# Patient Record
Sex: Male | Born: 1940 | Race: Black or African American | Hispanic: No | Marital: Married | Smoking: Former smoker
Health system: Southern US, Community
[De-identification: ages and names within clinical notes are randomized; demographics above are authoritative.]

## PROBLEM LIST (undated history)

## (undated) DIAGNOSIS — N390 Urinary tract infection, site not specified: Secondary | ICD-10-CM

## (undated) DIAGNOSIS — I10 Essential (primary) hypertension: Secondary | ICD-10-CM

## (undated) DIAGNOSIS — F0390 Unspecified dementia without behavioral disturbance: Secondary | ICD-10-CM

## (undated) DIAGNOSIS — G309 Alzheimer's disease, unspecified: Secondary | ICD-10-CM

## (undated) DIAGNOSIS — E119 Type 2 diabetes mellitus without complications: Secondary | ICD-10-CM

## (undated) DIAGNOSIS — F0280 Dementia in other diseases classified elsewhere without behavioral disturbance: Secondary | ICD-10-CM

## (undated) HISTORY — DX: Essential (primary) hypertension: I10

## (undated) HISTORY — DX: Type 2 diabetes mellitus without complications: E11.9

## (undated) HISTORY — PX: PROSTATE SURGERY: SHX751

## (undated) HISTORY — DX: Unspecified dementia, unspecified severity, without behavioral disturbance, psychotic disturbance, mood disturbance, and anxiety: F03.90

---

## 2005-05-04 ENCOUNTER — Emergency Department: Payer: Self-pay | Admitting: Emergency Medicine

## 2009-12-02 ENCOUNTER — Ambulatory Visit: Payer: Self-pay | Admitting: Urology

## 2009-12-08 ENCOUNTER — Inpatient Hospital Stay: Payer: Self-pay | Admitting: Urology

## 2014-04-30 ENCOUNTER — Ambulatory Visit: Payer: Self-pay | Admitting: Family Medicine

## 2015-04-20 ENCOUNTER — Other Ambulatory Visit: Payer: Self-pay | Admitting: Unknown Physician Specialty

## 2015-04-29 ENCOUNTER — Other Ambulatory Visit: Payer: Self-pay | Admitting: Unknown Physician Specialty

## 2015-04-29 MED ORDER — DONEPEZIL HCL 10 MG PO TABS: 10.0000 mg | ORAL_TABLET | Freq: Every day | ORAL | Status: DC

## 2015-04-29 MED ORDER — LISINOPRIL 20 MG PO TABS: 20.0000 mg | ORAL_TABLET | Freq: Every day | ORAL | Status: DC

## 2015-06-16 DIAGNOSIS — F0280 Dementia in other diseases classified elsewhere without behavioral disturbance: Secondary | ICD-10-CM

## 2015-06-16 DIAGNOSIS — G309 Alzheimer's disease, unspecified: Secondary | ICD-10-CM

## 2015-06-16 HISTORY — DX: Dementia in other diseases classified elsewhere, unspecified severity, without behavioral disturbance, psychotic disturbance, mood disturbance, and anxiety: F02.80

## 2015-06-16 HISTORY — DX: Alzheimer's disease, unspecified: G30.9

## 2015-07-25 ENCOUNTER — Ambulatory Visit (INDEPENDENT_AMBULATORY_CARE_PROVIDER_SITE_OTHER): Payer: Medicare Other | Admitting: Unknown Physician Specialty

## 2015-07-25 ENCOUNTER — Encounter: Payer: Self-pay | Admitting: Unknown Physician Specialty

## 2015-07-25 VITALS — BP 141/83 | HR 70 | Temp 97.9°F | Ht 69.3 in | Wt 160.8 lb

## 2015-07-25 DIAGNOSIS — I10 Essential (primary) hypertension: Secondary | ICD-10-CM | POA: Diagnosis not present

## 2015-07-25 DIAGNOSIS — F0390 Unspecified dementia without behavioral disturbance: Secondary | ICD-10-CM | POA: Diagnosis not present

## 2015-07-25 MED ORDER — MEMANTINE HCL 5 MG PO TABS: 5.0000 mg | ORAL_TABLET | Freq: Two times a day (BID) | ORAL | Status: DC

## 2015-07-25 NOTE — Patient Instructions (Signed)
Start Vitamin D 2,000 IUs/day

## 2015-07-25 NOTE — Progress Notes (Signed)
   BP 141/83 mmHg  Pulse 70  Temp(Src) 97.9 F (36.6 C)  Ht 5' 9.3" (1.76 m)  Wt 160 lb 12.8 oz (72.938 kg)  BMI 23.55 kg/m2  SpO2 98%   Subjective:    Patient ID: Dennis Sandoval, male    DOB: 05/12/1941, 74 y.o.   MRN: [id number]  HPI: Dennis Sandoval is a 74 y.o. male  Chief Complaint  Patient presents with  . Medication Refill    pt's daughter in law states that patient needs refills on medications   Hypertension This is a chronic problem. The problem is controlled. Pertinent negatives include no anxiety, blurred vision, chest pain, headaches, malaise/fatigue, neck pain, orthopnea, palpitations, peripheral edema, PND, shortness of breath or sweats. There are no compliance problems.    Dementia According to daughter, pt is starting to have a lot of irritability wiith memory loss.  He is still able to do all ADLs.  Family drives with him and feels he is a safe driver.  Living at home with his wife, also with demnetia  Relevant past medical, surgical, family and social history reviewed and updated as indicated. Interim medical history since our last visit reviewed. Allergies and medications reviewed and updated.  Review of Systems  Constitutional: Negative for malaise/fatigue.  Eyes: Negative for blurred vision.  Respiratory: Negative for shortness of breath.   Cardiovascular: Negative for chest pain, palpitations, orthopnea and PND.  Musculoskeletal: Negative for neck pain.  Neurological: Negative for headaches.    Per HPI unless specifically indicated above     Objective:    BP 141/83 mmHg  Pulse 70  Temp(Src) 97.9 F (36.6 C)  Ht 5' 9.3" (1.76 m)  Wt 160 lb 12.8 oz (72.938 kg)  BMI 23.55 kg/m2  SpO2 98%  Wt Readings from Last 3 Encounters:  07/25/15 160 lb 12.8 oz (72.938 kg)  07/10/13 178 lb (80.74 kg)    Physical Exam  Constitutional: He is oriented to person, place, and time. He appears well-developed and well-nourished. No distress.  HENT:  Head:  Normocephalic and atraumatic.  Eyes: Conjunctivae and lids are normal. Right eye exhibits no discharge. Left eye exhibits no discharge. No scleral icterus.  Cardiovascular: Normal rate, regular rhythm and normal heart sounds.   Pulmonary/Chest: Effort normal and breath sounds normal. No respiratory distress.  Abdominal: Normal appearance. There is no splenomegaly or hepatomegaly.  Musculoskeletal: Normal range of motion.  Neurological: He is alert and oriented to person, place, and time.  Skin: Skin is warm, dry and intact. No rash noted. No pallor.  Psychiatric: He has a normal mood and affect. His behavior is normal. Judgment and thought content normal.    No results found for this or any previous visit.    Assessment & Plan:   Problem List Items Addressed This Visit      Unprioritized   Dementia - Primary   Relevant Medications   memantine (NAMENDA) 5 MG tablet   Benign hypertension   Relevant Orders   Uric acid   Comprehensive metabolic panel   Lipid Panel w/o Chol/HDL Ratio   Microalbumin / creatinine urine ratio      Long discussion about trajectory of illness and safety issues.    Follow up plan: Return in about 3 months (around 10/24/2015) for physical.

## 2015-07-26 LAB — LIPID PANEL W/O CHOL/HDL RATIO
CHOLESTEROL TOTAL: 186 mg/dL (ref 100–199)
HDL: 61 mg/dL (ref >39)
LDL Calculated: 106 mg/dL — ABNORMAL HIGH (ref 0–99)
Triglycerides: 94 mg/dL (ref 0–149)
VLDL Cholesterol Cal: 19 mg/dL (ref 5–40)

## 2015-07-26 LAB — COMPREHENSIVE METABOLIC PANEL
ALK PHOS: 56 IU/L (ref 39–117)
ALT: 12 IU/L (ref 0–44)
AST: 11 IU/L (ref 0–40)
Albumin/Globulin Ratio: 1.5 (ref 1.1–2.5)
Albumin: 4.4 g/dL (ref 3.5–4.8)
BILIRUBIN TOTAL: 0.4 mg/dL (ref 0.0–1.2)
BUN/Creatinine Ratio: 13 (ref 10–22)
BUN: 13 mg/dL (ref 8–27)
CO2: 26 mmol/L (ref 18–29)
Calcium: 9.8 mg/dL (ref 8.6–10.2)
Chloride: 99 mmol/L (ref 97–108)
Creatinine, Ser: 0.99 mg/dL (ref 0.76–1.27)
GFR calc Af Amer: 86 mL/min/{1.73_m2} (ref >59)
GFR, EST NON AFRICAN AMERICAN: 74 mL/min/{1.73_m2} (ref >59)
GLOBULIN, TOTAL: 2.9 g/dL (ref 1.5–4.5)
Glucose: 137 mg/dL — ABNORMAL HIGH (ref 65–99)
POTASSIUM: 4.4 mmol/L (ref 3.5–5.2)
SODIUM: 138 mmol/L (ref 134–144)
Total Protein: 7.3 g/dL (ref 6.0–8.5)

## 2015-07-26 LAB — URIC ACID: Uric Acid: 3.6 mg/dL — ABNORMAL LOW (ref 3.7–8.6)

## 2015-07-26 LAB — MICROALBUMIN / CREATININE URINE RATIO
CREATININE, UR: 153.3 mg/dL
MICROALB/CREAT RATIO: 4.6 mg/g creat (ref 0.0–30.0)
Microalbumin, Urine: 7.1 ug/mL

## 2015-08-03 ENCOUNTER — Encounter: Payer: Self-pay | Admitting: Unknown Physician Specialty

## 2015-08-04 ENCOUNTER — Ambulatory Visit: Payer: Self-pay | Admitting: Unknown Physician Specialty

## 2015-10-06 ENCOUNTER — Encounter: Payer: Self-pay | Admitting: Unknown Physician Specialty

## 2015-10-29 ENCOUNTER — Telehealth: Payer: Self-pay | Admitting: Unknown Physician Specialty

## 2015-10-29 DIAGNOSIS — F0391 Unspecified dementia with behavioral disturbance: Secondary | ICD-10-CM

## 2015-10-30 NOTE — Telephone Encounter (Signed)
Routing to provider  

## 2015-10-30 NOTE — Telephone Encounter (Signed)
pts family(Dennis Sandoval(son) and Dennis Sandoval(daugher in law) would like to get a ref for Dennis Sand[facility] to [facility]. The phone number for the office is [phone] and fax is [phone].

## 2015-10-30 NOTE — Telephone Encounter (Signed)
I will put the order in, but they went to [facility] which [facility].  Some institutions don't do second opinions within the practice.  If they don't, [facility] is fine

## 2015-11-25 ENCOUNTER — Ambulatory Visit: Payer: Medicare Other | Admitting: Unknown Physician Specialty

## 2015-11-28 ENCOUNTER — Encounter: Payer: Self-pay | Admitting: Family Medicine

## 2015-11-28 ENCOUNTER — Ambulatory Visit (INDEPENDENT_AMBULATORY_CARE_PROVIDER_SITE_OTHER): Payer: Medicare Other | Admitting: Family Medicine

## 2015-11-28 VITALS — BP 147/77 | HR 74 | Temp 97.5°F | Ht 69.0 in | Wt 158.0 lb

## 2015-11-28 DIAGNOSIS — Z136 Encounter for screening for cardiovascular disorders: Secondary | ICD-10-CM

## 2015-11-28 DIAGNOSIS — Z720 Tobacco use: Secondary | ICD-10-CM | POA: Diagnosis not present

## 2015-11-28 DIAGNOSIS — I10 Essential (primary) hypertension: Secondary | ICD-10-CM

## 2015-11-28 DIAGNOSIS — Z87891 Personal history of nicotine dependence: Secondary | ICD-10-CM | POA: Insufficient documentation

## 2015-11-28 DIAGNOSIS — Z0000 Encounter for general adult medical examination without abnormal findings: Secondary | ICD-10-CM | POA: Diagnosis not present

## 2015-11-28 DIAGNOSIS — Z23 Encounter for immunization: Secondary | ICD-10-CM | POA: Diagnosis not present

## 2015-11-28 DIAGNOSIS — F0391 Unspecified dementia with behavioral disturbance: Secondary | ICD-10-CM

## 2015-11-28 NOTE — Patient Instructions (Addendum)
You received the pneumonia vaccine PCV-13 and you don't need another booster for the rest of your days Return for a fasting blood sugar next week Return the end of life paperwork for your records here in our chart Let's see how quickly we can get him to neurology  Health Maintenance, Male A healthy lifestyle and preventative care can promote health and wellness.  Maintain regular health, dental, and eye exams.  Eat a healthy diet. Foods like vegetables, fruits, whole grains, low-fat dairy products, and lean protein foods contain the nutrients you need and are low in calories. Decrease your intake of foods high in solid fats, added sugars, and salt. Get information about a proper diet from your health care provider, if necessary.  Regular physical exercise is one of the most important things you can do for your health. Most adults should get at least 150 minutes of moderate-intensity exercise (any activity that increases your heart rate and causes you to sweat) each week. In addition, most adults need muscle-strengthening exercises on 2 or more days a week.   Maintain a healthy weight. The body mass index (BMI) is a screening tool to identify possible weight problems. It provides an estimate of body fat based on height and weight. Your health care provider can find your BMI and can help you achieve or maintain a healthy weight. For males 20 years and older:  A BMI below 18.5 is considered underweight.  A BMI of 18.5 to 24.9 is normal.  A BMI of 25 to 29.9 is considered overweight.  A BMI of 30 and above is considered obese.  Maintain normal blood lipids and cholesterol by exercising and minimizing your intake of saturated fat. Eat a balanced diet with plenty of fruits and vegetables. Blood tests for lipids and cholesterol should begin at age 2 and be repeated every 5 years. If your lipid or cholesterol levels are high, you are over age 41, or you are at high risk for heart disease, you may  need your cholesterol levels checked more frequently.Ongoing high lipid and cholesterol levels should be treated with medicines if diet and exercise are not working.  If you smoke, find out from your health care provider how to quit. If you do not use tobacco, do not start.  Lung cancer screening is recommended for adults aged 55-80 years who are at high risk for developing lung cancer because of a history of smoking. A yearly low-dose CT scan of the lungs is recommended for people who have at least a 30-pack-year history of smoking and are current smokers or have quit within the past 15 years. A pack year of smoking is smoking an average of 1 pack of cigarettes a day for 1 year (for example, a 30-pack-year history of smoking could mean smoking 1 pack a day for 30 years or 2 packs a day for 15 years). Yearly screening should continue until the smoker has stopped smoking for at least 15 years. Yearly screening should be stopped for people who develop a health problem that would prevent them from having lung cancer treatment.  If you choose to drink alcohol, do not have more than 2 drinks per day. One drink is considered to be 12 oz (360 mL) of beer, 5 oz (150 mL) of wine, or 1.5 oz (45 mL) of liquor.  Avoid the use of street drugs. Do not share needles with anyone. Ask for help if you need support or instructions about stopping the use of drugs.  High  blood pressure causes heart disease and increases the risk of stroke. High blood pressure is more likely to develop in:  People who have blood pressure in the end of the normal range (100-139/85-89 mm Hg).  People who are overweight or obese.  People who are African American.  If you are 3518-76 years of age, have your blood pressure checked every 3-5 years. If you are 76 years of age or older, have your blood pressure checked every year. You should have your blood pressure measured twice--once when you are at a hospital or clinic, and once when you are  not at a hospital or clinic. Record the average of the two measurements. To check your blood pressure when you are not at a hospital or clinic, you can use:  An automated blood pressure machine at a pharmacy.  A home blood pressure monitor.  If you are 745-76 years old, ask your health care provider if you should take aspirin to prevent heart disease.  Diabetes screening involves taking a blood sample to check your fasting blood sugar level. This should be done once every 3 years after age 76 if you are at a normal weight and without risk factors for diabetes. Testing should be considered at a younger age or be carried out more frequently if you are overweight and have at least 1 risk factor for diabetes.  Colorectal cancer can be detected and often prevented. Most routine colorectal cancer screening begins at the age of 76 and continues through age 875. However, your health care provider may recommend screening at an earlier age if you have risk factors for colon cancer. On a yearly basis, your health care provider may provide home test kits to check for hidden blood in the stool. A small camera at the end of a tube may be used to directly examine the colon (sigmoidoscopy or colonoscopy) to detect the earliest forms of colorectal cancer. Talk to your health care provider about this at age 76 when routine screening begins. A direct exam of the colon should be repeated every 5-10 years through age 76, unless early forms of precancerous polyps or small growths are found.  People who are at an increased risk for hepatitis B should be screened for this virus. You are considered at high risk for hepatitis B if:  You were born in a country where hepatitis B occurs often. Talk with your health care provider about which countries are considered high risk.  Your parents were born in a high-risk country and you have not received a shot to protect against hepatitis B (hepatitis B vaccine).  You have HIV or  AIDS.  You use needles to inject street drugs.  You live with, or have sex with, someone who has hepatitis B.  You are a man who has sex with other men (MSM).  You get hemodialysis treatment.  You take certain medicines for conditions like cancer, organ transplantation, and autoimmune conditions.  Hepatitis C blood testing is recommended for all people born from [id number] through 1965 and any individual with known risk factors for hepatitis C.  Healthy men should no longer receive prostate-specific antigen (PSA) blood tests as part of routine cancer screening. Talk to your health care provider about prostate cancer screening.  Testicular cancer screening is not recommended for adolescents or adult males who have no symptoms. Screening includes self-exam, a health care provider exam, and other screening tests. Consult with your health care provider about any symptoms you have or any concerns  you have about testicular cancer.  Practice safe sex. Use condoms and avoid high-risk sexual practices to reduce the spread of sexually transmitted infections (STIs).  You should be screened for STIs, including gonorrhea and chlamydia if:  You are sexually active and are younger than 24 years.  You are older than 24 years, and your health care provider tells you that you are at risk for this type of infection.  Your sexual activity has changed since you were last screened, and you are at an increased risk for chlamydia or gonorrhea. Ask your health care provider if you are at risk.  If you are at risk of being infected with HIV, it is recommended that you take a prescription medicine daily to prevent HIV infection. This is called pre-exposure prophylaxis (PrEP). You are considered at risk if:  You are a man who has sex with other men (MSM).  You are a heterosexual man who is sexually active with multiple partners.  You take drugs by injection.  You are sexually active with a partner who has  HIV.  Talk with your health care provider about whether you are at high risk of being infected with HIV. If you choose to begin PrEP, you should first be tested for HIV. You should then be tested every 3 months for as long as you are taking PrEP.  Use sunscreen. Apply sunscreen liberally and repeatedly throughout the day. You should seek shade when your shadow is shorter than you. Protect yourself by wearing long sleeves, pants, a wide-brimmed hat, and sunglasses year round whenever you are outdoors.  Tell your health care provider of new moles or changes in moles, especially if there is a change in shape or color. Also, tell your health care provider if a mole is larger than the size of a pencil eraser.  A one-time screening for abdominal aortic aneurysm (AAA) and surgical repair of large AAAs by ultrasound is recommended for men aged 20-75 years who are current or former smokers.  Stay current with your vaccines (immunizations).   This information is not intended to replace advice given to you by your health care provider. Make sure you discuss any questions you have with your health care provider.   Document Released: 04/29/2008 Document Revised: 11/22/2014 Document Reviewed: 03/29/2011 Elsevier Interactive Patient Education [employer].

## 2015-11-28 NOTE — Progress Notes (Signed)
BP 147/77 mmHg  Pulse 74  Temp(Src) 97.5 F (36.4 C)  Ht 5\' 9"  (1.753 m)  Wt 158 lb (71.668 kg)  BMI 23.32 kg/m2  SpO2 100%   Subjective:    Patient ID: Dennis Sandoval, male    DOB: 1941-10-23, 75 y.o.   MRN: [id number]  HPI: Dennis Sandoval is a 75 y.o. male  Chief Complaint  Patient presents with  . Annual Exam    Medicare Wellness  . Immunizations    he is interested in getting a pneumonia vaccine  Son says father has behavioral issues; he has dementia; when he gets mad, he gets mad, he has never hit her, not evil, but he gets mad for 2-3 days; not hypersexual, not cursing  USPSTF grade A and B recommendations Alcohol: rare Depression: no  Hypertension: controlled today Obesity: n/a Tobacco use: previous smoker; quit 25 years ago HIV, hep B, hep C: declined Lipids: excellent panel in Sept Glucose: recheck Colorectal cancer: last done in 2012 Lung cancer: n/a, quit long enough ago AAA: ordered US Aspirin: 81 mg recommended Diet: not as good eater Exercise: no Skin cancer: scar right arm  Year: not sure Month: October Time: around 6 pm Count 20-1: missed 3 and 2 Months: Dec, Jan, Feb,... Dec, Nov, Oct, Sept, ... Couldn't recall  No hearing issues Just got health care POA  Past Medical History  Diagnosis Date  . Hypertension   . Dementia   . Diabetes mellitus without complication (HCC)    History reviewed. No pertinent past surgical history. He did have surgery for an enlarged prostate  Relevant past medical, surgical, family and social history reviewed and updated as indicated. Interim medical history since our last visit reviewed. Allergies and medications reviewed and updated.  Review of Systems  Per HPI unless specifically indicated above     Objective:    BP 147/77 mmHg  Pulse 74  Temp(Src) 97.5 F (36.4 C)  Ht 5\' 9"  (1.753 m)  Wt 158 lb (71.668 kg)  BMI 23.32 kg/m2  SpO2 100%  Wt Readings from Last 3 Encounters:  11/28/15 158 lb  (71.668 kg)  07/25/15 160 lb 12.8 oz (72.938 kg)  07/10/13 178 lb (80.74 kg)    Physical Exam  Constitutional: He appears well-developed and well-nourished.  Eyes: EOM are normal.  Cardiovascular: Normal rate.   Pulmonary/Chest: Effort normal.  Neurological: He is alert.  Psychiatric: His mood appears not anxious. His speech is not delayed and not slurred. He is not agitated, not aggressive, not withdrawn and not combative. Thought content is not paranoid. Cognition and memory are impaired. He does not express impulsivity. He does not exhibit a depressed mood. He is communicative. He exhibits abnormal recent memory. He is attentive.    Results for orders placed or performed in visit on 07/25/15  Uric acid  Result Value Ref Range   Uric Acid 3.6 (L) 3.7 - 8.6 mg/dL  Comprehensive metabolic panel  Result Value Ref Range   Glucose 137 (H) 65 - 99 mg/dL   BUN 13 8 - 27 mg/dL   Creatinine, Ser 9.56 0.76 - 1.27 mg/dL   GFR calc non Af Amer 74 >59 mL/min/1.73   GFR calc Af Amer 86 >59 mL/min/1.73   BUN/Creatinine Ratio 13 10 - 22   Sodium 138 134 - 144 mmol/L   Potassium 4.4 3.5 - 5.2 mmol/L   Chloride 99 97 - 108 mmol/L   CO2 26 18 - 29 mmol/L   Calcium 9.8  8.6 - 10.2 mg/dL   Total Protein 7.3 6.0 - 8.5 g/dL   Albumin 4.4 3.5 - 4.8 g/dL   Globulin, Total 2.9 1.5 - 4.5 g/dL   Albumin/Globulin Ratio 1.5 1.1 - 2.5   Bilirubin Total 0.4 0.0 - 1.2 mg/dL   Alkaline Phosphatase 56 39 - 117 IU/L   AST 11 0 - 40 IU/L   ALT 12 0 - 44 IU/L  Lipid Panel w/o Chol/HDL Ratio  Result Value Ref Range   Cholesterol, Total 186 100 - 199 mg/dL   Triglycerides 94 0 - 149 mg/dL   HDL 61 >16>39 mg/dL   VLDL Cholesterol Cal 19 5 - 40 mg/dL   LDL Calculated 109106 (H) 0 - 99 mg/dL  Microalbumin / creatinine urine ratio  Result Value Ref Range   Creatinine, Urine 153.3 Not Estab. mg/dL   Microalbum.,U,Random 7.1 Not Estab. ug/mL   MICROALB/CREAT RATIO 4.6 0.0 - 30.0 mg/g creat      Assessment & Plan:    Problem List Items Addressed This Visit      Cardiovascular and Mediastinum   Benign hypertension    Fair control today      Relevant Medications   aspirin EC 81 MG tablet     Nervous and Auditory   Dementia    Refer back to neurologist, behavioral issues; does not sound like frontal lobe lesion or Lewy body dementia, but will defer further work-up/testing and medication adjustment to neurologist      Relevant Orders   Ambulatory referral to Neurology     Other   Smoking history    Order abdominal US to r/o AAA, as I don't think this has been done      Relevant Orders   US Retroperitoneal Ltd   Screening for AAA (abdominal aortic aneurysm)    Retroperitoneal US to r/o AAA given his smoking history      Preventative health care - Primary    USPSTF grade A and B recommendations reviewed with patient; age-appropriate recommendations, preventive care, screening tests, etc discussed and encouraged; healthy living encouraged; see AVS for patient education given to patient      Need for pneumococcal vaccination    PCV-13 offered, given; no more needed in his lifetime per current ACIP guidelines      Relevant Orders   Pneumococcal conjugate vaccine 13-valent (Completed)      Follow up plan: Return in about 1 year (around 11/27/2016) for Medicare Wellness.  An after-visit summary was printed and given to the patient at check-out.  Please see the patient instructions which may contain other information and recommendations beyond what is mentioned above in the assessment and plan.

## 2015-12-01 ENCOUNTER — Telehealth: Payer: Self-pay | Admitting: Family Medicine

## 2015-12-01 DIAGNOSIS — Z136 Encounter for screening for cardiovascular disorders: Secondary | ICD-10-CM | POA: Insufficient documentation

## 2015-12-01 NOTE — Telephone Encounter (Signed)
-----   Message from Norina BuzzardJennifer C Stafford sent at 12/01/2015 11:52 AM EST ----- Regarding: Please correct order Patient is still within limits to have the initial medicare screening. Please change order to [place] aorta initial medicare screening ([id number]). Thank you and have a great day

## 2015-12-01 NOTE — Telephone Encounter (Signed)
done

## 2015-12-08 ENCOUNTER — Ambulatory Visit
Admission: RE | Admit: 2015-12-08 | Discharge: 2015-12-08 | Disposition: A | Discharge status: Home / Self Care | Payer: Medicare Other | Source: Ambulatory Visit | Attending: Family Medicine | Admitting: Family Medicine

## 2015-12-08 DIAGNOSIS — Z136 Encounter for screening for cardiovascular disorders: Secondary | ICD-10-CM | POA: Diagnosis not present

## 2015-12-21 DIAGNOSIS — Z0000 Encounter for general adult medical examination without abnormal findings: Secondary | ICD-10-CM | POA: Insufficient documentation

## 2015-12-21 DIAGNOSIS — Z23 Encounter for immunization: Secondary | ICD-10-CM | POA: Insufficient documentation

## 2015-12-21 MED ORDER — ASPIRIN EC 81 MG PO TBEC: 81.0000 mg | DELAYED_RELEASE_TABLET | Freq: Every day | ORAL | Status: DC

## 2015-12-21 NOTE — Assessment & Plan Note (Signed)
Fair control today 

## 2015-12-21 NOTE — Assessment & Plan Note (Signed)
PCV-13 offered, given; no more needed in his lifetime per current ACIP guidelines

## 2015-12-21 NOTE — Assessment & Plan Note (Signed)
USPSTF grade A and B recommendations reviewed with patient; age-appropriate recommendations, preventive care, screening tests, etc discussed and encouraged; healthy living encouraged; see AVS for patient education given to patient  

## 2015-12-21 NOTE — Assessment & Plan Note (Signed)
Retroperitoneal [place] to r/o AAA given his smoking history

## 2015-12-21 NOTE — Assessment & Plan Note (Signed)
Refer back to neurologist, behavioral issues; does not sound like frontal lobe lesion or Lewy body dementia, but will defer further work-up/testing and medication adjustment to neurologist

## 2015-12-21 NOTE — Assessment & Plan Note (Signed)
Order abdominal US to r/o AAA, as I don't think this has been done

## 2015-12-28 ENCOUNTER — Other Ambulatory Visit: Payer: Self-pay | Admitting: Unknown Physician Specialty

## 2016-01-13 ENCOUNTER — Telehealth: Payer: Self-pay | Admitting: Family Medicine

## 2016-01-13 NOTE — Telephone Encounter (Signed)
Routing to provider  

## 2016-01-13 NOTE — Telephone Encounter (Signed)
Pt's daughter in law called. Stated she was instructed at pt's last appt that if pt became more aggravated to contact Dr. Sherie Don and inform for a possible medication to calm pt down. Stated she is making that call. Please call daughter in law to discuss behaviors of patient as well as options. Thanks.

## 2016-01-14 MED ORDER — ESCITALOPRAM OXALATE 10 MG PO TABS: 5.0000 mg | ORAL_TABLET | Freq: Every day | ORAL | Status: AC

## 2016-01-14 MED ORDER — DONEPEZIL HCL 10 MG PO TABS: 10.0000 mg | ORAL_TABLET | Freq: Every day | ORAL | Status: AC

## 2016-01-14 MED ORDER — MEMANTINE HCL 5 MG PO TABS: ORAL_TABLET | ORAL | Status: DC

## 2016-01-14 NOTE — Telephone Encounter (Signed)
I see that he has an appt with neurologist next week; in the meantime, I'll call daughter in between patients or at lunch and try something until he gets in to see neuro

## 2016-01-14 NOTE — Telephone Encounter (Signed)
I don't see an "FYI" key at the top; did not see Dorothea on tabs for return calls I found DPR in media tab that gives permission to talk to her He's not being combative; he is being aggressive, assertive; normally pretty laid back He is not receptive to mediation with son and daughter-in-law He goes to see the neurologist next week No acute decline or sudden change in behavior (such as UTI, etc.) Suggested alzheimer's association page about combative behavior, information about dealing with patients affected with dementia Let's increase the memantine, 5 mg daily now, increase to 5 mg BID x 1 week, then 5 mg AM and 10 mg PM x 1 week, then 10 mg BID I asked if any depression; she thinks yes, so let's start antidepressant They are losing weight because they are not eating; she is getting meals on wheels; they are forgetting to eat, maybe not hungry

## 2016-01-14 NOTE — Telephone Encounter (Signed)
I told daughter-in-law about appt, Dr. Patrcia Dolly, next Tuesday Keri -- Please call [name] with appointment information for the neurologist Peregrine Nolt [phone]  <---The number that was lit up as I hit "complete" under contacts was not the correct number, so use this number instead Ask front staff to include an "FYI" tab (I had to search to find if okay to talk to daughter-in-law)

## 2016-01-15 NOTE — Telephone Encounter (Signed)
DPR added to FYI.

## 2016-01-15 NOTE — Telephone Encounter (Signed)
Patient already made an appointment for July at [facility].   Canceling [facility] neuro appointment.

## 2016-01-15 NOTE — Telephone Encounter (Addendum)
Tried calling the number provided and didn't get an answer. Left VM for daughter in law to return my call.   Will forward to [place] to add the FYI tab for Dennis Sandoval number and that it's ok to talk to her about patient.   If [name] does call back, Patient's appointment is 01/20/2016--10:30am [facility] in [place]. Patient should arrive early because this is a New Patient appointment.

## 2016-01-20 ENCOUNTER — Ambulatory Visit: Payer: Medicare Other | Admitting: Neurology

## 2016-01-26 ENCOUNTER — Other Ambulatory Visit: Payer: Self-pay | Admitting: Family Medicine

## 2016-01-26 MED ORDER — MEMANTINE HCL 10 MG PO TABS: 10.0000 mg | ORAL_TABLET | Freq: Two times a day (BID) | ORAL | Status: DC

## 2016-01-27 ENCOUNTER — Other Ambulatory Visit: Payer: Self-pay | Admitting: Unknown Physician Specialty

## 2016-01-27 DIAGNOSIS — Z5181 Encounter for therapeutic drug level monitoring: Secondary | ICD-10-CM

## 2016-01-27 DIAGNOSIS — R739 Hyperglycemia, unspecified: Secondary | ICD-10-CM

## 2016-01-28 DIAGNOSIS — R739 Hyperglycemia, unspecified: Secondary | ICD-10-CM | POA: Insufficient documentation

## 2016-01-28 DIAGNOSIS — Z5181 Encounter for therapeutic drug level monitoring: Secondary | ICD-10-CM | POA: Insufficient documentation

## 2016-01-28 NOTE — Telephone Encounter (Signed)
Note and rx

## 2016-01-28 NOTE — Telephone Encounter (Signed)
Rx approved Last labs reviewed; patient due for labs now; please call family, ask them to bring him fasting for labs this week or next

## 2016-01-28 NOTE — Telephone Encounter (Signed)
I think [facility] meant to send this to you

## 2016-01-28 NOTE — Telephone Encounter (Signed)
Patient's son notified.

## 2016-02-23 ENCOUNTER — Other Ambulatory Visit: Payer: Self-pay | Admitting: Family Medicine

## 2016-02-24 ENCOUNTER — Encounter: Payer: Self-pay | Admitting: Emergency Medicine

## 2016-02-24 ENCOUNTER — Emergency Department
Admission: EM | Admit: 2016-02-24 | Discharge: 2016-02-24 | Disposition: A | Discharge status: Home / Self Care | Payer: Medicare Other | Attending: Emergency Medicine | Admitting: Emergency Medicine

## 2016-02-24 ENCOUNTER — Other Ambulatory Visit: Payer: Self-pay

## 2016-02-24 DIAGNOSIS — Z7982 Long term (current) use of aspirin: Secondary | ICD-10-CM | POA: Insufficient documentation

## 2016-02-24 DIAGNOSIS — I10 Essential (primary) hypertension: Secondary | ICD-10-CM | POA: Insufficient documentation

## 2016-02-24 DIAGNOSIS — R42 Dizziness and giddiness: Secondary | ICD-10-CM | POA: Diagnosis present

## 2016-02-24 DIAGNOSIS — R55 Syncope and collapse: Secondary | ICD-10-CM | POA: Diagnosis not present

## 2016-02-24 DIAGNOSIS — Z87891 Personal history of nicotine dependence: Secondary | ICD-10-CM | POA: Insufficient documentation

## 2016-02-24 DIAGNOSIS — E1165 Type 2 diabetes mellitus with hyperglycemia: Secondary | ICD-10-CM | POA: Insufficient documentation

## 2016-02-24 DIAGNOSIS — F0390 Unspecified dementia without behavioral disturbance: Secondary | ICD-10-CM | POA: Insufficient documentation

## 2016-02-24 LAB — BASIC METABOLIC PANEL
Anion gap: 4 — ABNORMAL LOW (ref 5–15)
BUN: 16 mg/dL (ref 6–20)
CALCIUM: 9.2 mg/dL (ref 8.9–10.3)
CO2: 26 mmol/L (ref 22–32)
CREATININE: 1.07 mg/dL (ref 0.61–1.24)
Chloride: 106 mmol/L (ref 101–111)
Glucose, Bld: 113 mg/dL — ABNORMAL HIGH (ref 65–99)
Potassium: 3.7 mmol/L (ref 3.5–5.1)
Sodium: 136 mmol/L (ref 135–145)

## 2016-02-24 LAB — URINALYSIS COMPLETE WITH MICROSCOPIC (ARMC ONLY)
BILIRUBIN URINE: NEGATIVE
GLUCOSE, UA: NEGATIVE mg/dL
Hgb urine dipstick: NEGATIVE
Leukocytes, UA: NEGATIVE
Nitrite: NEGATIVE
Protein, ur: NEGATIVE mg/dL
Specific Gravity, Urine: 1.023 (ref 1.005–1.030)
Squamous Epithelial / LPF: NONE SEEN
pH: 6 (ref 5.0–8.0)

## 2016-02-24 LAB — CBC
HCT: 39.6 % — ABNORMAL LOW (ref 40.0–52.0)
Hemoglobin: 13 g/dL (ref 13.0–18.0)
MCH: 26.8 pg (ref 26.0–34.0)
MCHC: 32.9 g/dL (ref 32.0–36.0)
MCV: 81.5 fL (ref 80.0–100.0)
PLATELETS: 283 10*3/uL (ref 150–440)
RBC: 4.86 MIL/uL (ref 4.40–5.90)
RDW: 18.7 % — ABNORMAL HIGH (ref 11.5–14.5)
WBC: 6.3 10*3/uL (ref 3.8–10.6)

## 2016-02-24 LAB — GLUCOSE, CAPILLARY: Glucose-Capillary: 99 mg/dL (ref 65–99)

## 2016-02-24 LAB — TROPONIN I

## 2016-02-24 MED ORDER — SODIUM CHLORIDE 0.9 % IV SOLN
1000.0000 mL | Freq: Once | INTRAVENOUS | Status: AC
  Administered 2016-02-24: 1000 mL via INTRAVENOUS

## 2016-02-24 NOTE — Discharge Instructions (Signed)

## 2016-02-24 NOTE — Telephone Encounter (Signed)
BMP just done today; normal Cr and K+ BP 128/78 Rx approved

## 2016-02-24 NOTE — ED Notes (Signed)
Dr York Ceriseforbach reviewed ekg

## 2016-02-24 NOTE — ED Provider Notes (Signed)
[facility] Emergency Department Provider Note  ____________________________________________    I have reviewed the triage vital signs and the nursing notes.   HISTORY  Chief Complaint Dizziness    HPI [address] Dennis Sandoval is a 75 y.o. male who presents from home after he felt dizzy. 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 Family he did complain to family that he felt dizzy. No complaints of chest pain or shortness of breath. He feels very well now and is anxious to go.     Past Medical History  Diagnosis Date  . Hypertension   . Dementia   . Diabetes mellitus without complication (HCC)   . Dementia     Patient Active Problem List   Diagnosis Date Noted  . Medication monitoring encounter 01/28/2016  . Hyperglycemia 01/28/2016  . Preventative health care 12/21/2015  . Need for pneumococcal vaccination 12/21/2015  . Screening for AAA (abdominal aortic aneurysm) 12/01/2015  . Smoking history 11/28/2015  . Dementia 07/25/2015  . Benign hypertension 07/25/2015    Past Surgical History  Procedure Laterality Date  . Prostate surgery      Current Outpatient Rx  Name  Route  Sig  Dispense  Refill  . aspirin EC 81 MG tablet   Oral   Take 1 tablet (81 mg total) by mouth daily.   30 tablet   11   . donepezil (ARICEPT) 10 MG tablet   Oral   Take 1 tablet (10 mg total) by mouth at bedtime.   30 tablet   11   . escitalopram (LEXAPRO) 10 MG tablet   Oral   Take 0.5 tablets (5 mg total) by mouth daily. (for mood)   30 tablet   0   . lisinopril (PRINIVIL,ZESTRIL) 20 MG tablet      TAKE ONE TABLET BY MOUTH ONCE DAILY   30 tablet   0   . memantine (NAMENDA) 10 MG tablet   Oral   Take 1 tablet (10 mg total) by mouth 2 (two) times daily.   60 tablet   6     Allergies Review of patient's allergies indicates no known allergies.  Family History  Problem Relation Age of Onset  . Cancer Neg Hx   .  Heart disease Neg Hx   . Diabetes Neg Hx   . Miscarriages / Stillbirths Neg Hx     Social History Social History  Substance Use Topics  . Smoking status: Former Smoker -- 0.50 packs/day for 50 years    Types: Cigarettes    Quit date: 11/27/1985  . Smokeless tobacco: Never Used  . Alcohol Use: No    Review of Systems, Family helped  Constitutional: Negative for fever.No longer dizzy Eyes: Negative for redness ENT: Negative for sore throat Cardiovascular: Negative for chest pain Respiratory: Negative for shortness of breath. Gastrointestinal: Negative for abdominal pain Genitourinary: Negative for dysuria. Musculoskeletal: Negative for back pain. Skin: Negative for injury Neurological: Negative for focal weakness, no headache Psychiatric: no anxiety    ____________________________________________   PHYSICAL EXAM:  VITAL SIGNS: ED Triage Vitals  Enc Vitals Group     BP 02/24/16 1308 128/78 mmHg     Pulse Rate 02/24/16 1308 69     Resp 02/24/16 1308 20     Temp 02/24/16 1308 98.4 F (36.9 C)     Temp Source 02/24/16 1308 Oral     SpO2 02/24/16 1308 99 %     Weight 02/24/16 1308 160 lb  9.6 oz (72.848 kg)     Height 02/24/16 1308 5\' 10"  (1.778 m)     Head Cir --      Peak Flow --      Pain Score 02/24/16 1308 0     Pain Loc --      Pain Edu? --      Excl. in GC? --      Constitutional: Alert. Well appearing and in no distress.  Eyes: Conjunctivae are normal. No erythema or injection ENT   Head: Normocephalic and atraumatic.   Mouth/Throat: Mucous membranes are moist. Cardiovascular: Normal rate, regular rhythm. Normal and symmetric distal pulses are present in the upper extremities. Respiratory: Normal respiratory effort without tachypnea nor retractions. Breath sounds are clear and equal bilaterally.  Gastrointestinal: Soft and non-tender in all quadrants. No distention. There is no CVA tenderness. Genitourinary: deferred Musculoskeletal: Nontender  with normal range of motion in all extremities. No lower extremity tenderness nor edema. Neurologic:  Normal speech and language. No gross focal neurologic deficits are appreciated. Skin:  Skin is warm, dry and intact. No rash noted. Psychiatric: Mood and affect are normal. Patient exhibits appropriate insight and judgment.  ____________________________________________    LABS (pertinent positives/negatives)  Labs Reviewed  BASIC METABOLIC PANEL - Abnormal; Notable for the following:    Glucose, Bld 113 (*)    Anion gap 4 (*)    All other components within normal limits  CBC - Abnormal; Notable for the following:    HCT 39.6 (*)    RDW 18.7 (*)    All other components within normal limits  URINALYSIS COMPLETEWITH MICROSCOPIC ([facility] ONLY) - Abnormal; Notable for the following:    Color, Urine YELLOW (*)    APPearance CLEAR (*)    Ketones, ur TRACE (*)    Bacteria, UA RARE (*)    All other components within normal limits  GLUCOSE, CAPILLARY  TROPONIN I  CBG MONITORING, ED    ____________________________________________   EKG  ED ECG REPORT I, Jene EveryKINNER, Dawaun Brancato, the attending physician, personally viewed and interpreted this ECG.  Date: 02/24/2016  Rate: 65 Rhythm: normal sinus rhythm QRS Axis: normal Intervals: normal ST/T Wave abnormalities: normal Conduction Disturbances: none Narrative Interpretation: unremarkable   ____________________________________________    RADIOLOGY  None  ____________________________________________   PROCEDURES  Procedure(s) performed: none  Critical Care performed: none  ____________________________________________   INITIAL IMPRESSION / ASSESSMENT AND PLAN / ED COURSE  Pertinent labs & imaging results that were available during my care of the patient were reviewed by me and considered in my medical decision making (see chart for details).  Patient well appearing and in No distress. Apparently had a brief episode of  dizziness. Asymptomatic in the emergency department. Lab work is unremarkable. EKG is normal. Patient feels very well and is anxious to leave. I feel this is appropriate. I asked him to follow up with his PCP. Return precautions discussed with family  ____________________________________________   FINAL CLINICAL IMPRESSION(S) / ED DIAGNOSES  Final diagnoses:  Near syncope          Jene Everyobert Alexica Schlossberg, MD 02/24/16 1529

## 2016-02-24 NOTE — ED Notes (Signed)
Pt via pov from home after a dizzy spell. States it lasted about 30 minutes and is still feeling a bit dizzy. Pt had prior experience several months ago but did not seek medical attention. Pt alert and oriented to self, suffers from early stages of dementia. NAD noted.

## 2016-04-07 ENCOUNTER — Other Ambulatory Visit: Payer: Self-pay | Admitting: Family Medicine

## 2016-04-07 ENCOUNTER — Telehealth: Payer: Self-pay | Admitting: Family Medicine

## 2016-04-07 NOTE — Telephone Encounter (Signed)
The orders were entered in March; I think staff had trouble printing them out (?) so I printed them out and gave them to the son

## 2016-04-07 NOTE — Telephone Encounter (Signed)
Son Dennis Sandoval(Dennis Sandoval) is requesting a lab order. He is needing to do bloodwork so he can get his refills on his medication

## 2016-04-08 LAB — COMPREHENSIVE METABOLIC PANEL
ALT: 9 IU/L (ref 0–44)
AST: 13 IU/L (ref 0–40)
Albumin/Globulin Ratio: 1.5 (ref 1.2–2.2)
Albumin: 4.2 g/dL (ref 3.5–4.8)
Alkaline Phosphatase: 57 IU/L (ref 39–117)
BUN/Creatinine Ratio: 10 (ref 10–24)
BUN: 10 mg/dL (ref 8–27)
Bilirubin Total: 0.7 mg/dL (ref 0.0–1.2)
CALCIUM: 9.5 mg/dL (ref 8.6–10.2)
CO2: 24 mmol/L (ref 18–29)
CREATININE: 1.02 mg/dL (ref 0.76–1.27)
Chloride: 102 mmol/L (ref 96–106)
GFR calc Af Amer: 83 mL/min/{1.73_m2} (ref >59)
GFR, EST NON AFRICAN AMERICAN: 72 mL/min/{1.73_m2} (ref >59)
GLUCOSE: 115 mg/dL — AB (ref 65–99)
Globulin, Total: 2.8 g/dL (ref 1.5–4.5)
POTASSIUM: 5.1 mmol/L (ref 3.5–5.2)
Sodium: 140 mmol/L (ref 134–144)
Total Protein: 7 g/dL (ref 6.0–8.5)

## 2016-04-08 LAB — HGB A1C W/O EAG: Hgb A1c MFr Bld: 5.1 % (ref 4.8–5.6)

## 2016-04-13 ENCOUNTER — Other Ambulatory Visit: Payer: Self-pay

## 2016-04-14 ENCOUNTER — Telehealth: Payer: Self-pay | Admitting: Family Medicine

## 2016-04-14 MED ORDER — MEMANTINE HCL 10 MG PO TABS: 10.0000 mg | ORAL_TABLET | Freq: Two times a day (BID) | ORAL | Status: DC

## 2016-04-14 MED ORDER — MEMANTINE HCL 5 MG PO TABS: 5.0000 mg | ORAL_TABLET | Freq: Two times a day (BID) | ORAL | Status: AC

## 2016-04-14 NOTE — Telephone Encounter (Signed)
I spoke with daughter-in-law; he responded well to the Namenda to just two pills a day, so he's doing well on just 5 mg BID His behavior has been low key, but he had a flare up two to three days ago

## 2016-04-14 NOTE — Telephone Encounter (Signed)
approved

## 2016-05-19 ENCOUNTER — Other Ambulatory Visit: Payer: Self-pay | Admitting: Family Medicine

## 2016-05-19 NOTE — Telephone Encounter (Signed)
May 2017 labs reviewed (Cr and K+) Rx approved

## 2016-10-17 ENCOUNTER — Other Ambulatory Visit: Payer: Self-pay | Admitting: Family Medicine

## 2016-10-17 NOTE — Telephone Encounter (Signed)
May 2017 labs reviewed; Rx approved 

## 2016-10-20 ENCOUNTER — Ambulatory Visit (INDEPENDENT_AMBULATORY_CARE_PROVIDER_SITE_OTHER): Payer: Medicare Other | Admitting: Family Medicine

## 2016-10-20 ENCOUNTER — Encounter: Payer: Self-pay | Admitting: Family Medicine

## 2016-10-20 VITALS — BP 124/82 | HR 82 | Temp 97.8°F | Resp 14 | Wt 158.0 lb

## 2016-10-20 DIAGNOSIS — J101 Influenza due to other identified influenza virus with other respiratory manifestations: Secondary | ICD-10-CM | POA: Diagnosis not present

## 2016-10-20 DIAGNOSIS — J3489 Other specified disorders of nose and nasal sinuses: Secondary | ICD-10-CM

## 2016-10-20 LAB — POCT INFLUENZA A/B
INFLUENZA A, POC: POSITIVE — AB
Influenza B, POC: NEGATIVE

## 2016-10-20 MED ORDER — OSELTAMIVIR PHOSPHATE 75 MG PO CAPS: 75.0000 mg | ORAL_CAPSULE | Freq: Two times a day (BID) | ORAL | 0 refills | Status: AC

## 2016-10-20 NOTE — Patient Instructions (Addendum)
Start the new medicine for flu He is very contagious  Influenza, Adult Influenza ("the flu") is an infection in the lungs, nose, and throat (respiratory tract). It is caused by a virus. The flu causes many common cold symptoms, as well as a high fever and body aches. It can make you feel very sick. The flu spreads easily from person to person (is contagious). Getting a flu shot (influenza vaccination) every year is the best way to prevent the flu. Follow these instructions at home:  Take over-the-counter and prescription medicines only as told by your doctor.  Use a cool mist humidifier to add moisture (humidity) to the air in your home. This can make it easier to breathe.  Rest as needed.  Drink enough fluid to keep your pee (urine) clear or pale yellow.  Cover your mouth and nose when you cough or sneeze.  Wash your hands with soap and water often, especially after you cough or sneeze. If you cannot use soap and water, use hand sanitizer.  Stay home from work or school as told by your doctor. Unless you are visiting your doctor, try to avoid leaving home until your fever has been gone for 24 hours without the use of medicine.  Keep all follow-up visits as told by your doctor. This is important. How is this prevented?  Getting a yearly (annual) flu shot is the best way to avoid getting the flu. You may get the flu shot in late summer, fall, or winter. Ask your doctor when you should get your flu shot.  Wash your hands often or use hand sanitizer often.  Avoid contact with people who are sick during cold and flu season.  Eat healthy foods.  Drink plenty of fluids.  Get enough sleep.  Exercise regularly. Contact a doctor if:  You get new symptoms.  You have:  Chest pain.  Watery poop (diarrhea).  A fever.  Your cough gets worse.  You start to have more mucus.  You feel sick to your stomach (nauseous).  You throw up (vomit). Get help right away if:  You start  to be short of breath or have trouble breathing.  Your skin or nails turn a bluish color.  You have very bad pain or stiffness in your neck.  You get a sudden headache.  You get sudden pain in your face or ear.  You cannot stop throwing up. This information is not intended to replace advice given to you by your health care provider. Make sure you discuss any questions you have with your health care provider. Document Released: 08/10/2008 Document Revised: 04/08/2016 Document Reviewed: 08/26/2015 Elsevier Interactive Patient Education  2017 [employer]

## 2016-10-20 NOTE — Progress Notes (Signed)
BP 124/82   Pulse 82   Temp 97.8 F (36.6 C) (Oral)   Resp 14   Wt 158 lb (71.7 kg)   SpO2 97%   BMI 22.67 kg/m    Subjective:    Patient ID: Dennis Sandoval, male    DOB: 04/02/1941, 75 y.o.   MRN: [id number]  HPI: Dennis ScoreSt Clair Sandoval is a 75 y.o. male  Chief Complaint  Patient presents with  . URI    runny nose, congestion.  Son is concerned he has the start of something.  Wife currently in huspital for the flu   Patient is brought in by his son today; they think he is getting sick; patient really denies any complaints but he has dementia; son has noticed that he is just different, thinks he is getting sick; patient's wife is currently in the hospital with type A influenza Patient is a very poor historian and when asked about symptoms, says he feels fine; talked about his job that he had for years, and that he's never been to jail (?), but was really unable to provide any meaningful information about his symptoms  [facility] 10/20/2016 11/28/2015  Decreased Interest 0 0  Down, Depressed, Hopeless 0 0  PHQ - 2 Score 0 0   Relevant past medical, surgical, family and social history reviewed Past Medical History:  Diagnosis Date  . Dementia   . Dementia   . Diabetes mellitus without complication (HCC)   . Hypertension    Past Surgical History:  Procedure Laterality Date  . PROSTATE SURGERY     Family History  Problem Relation Age of Onset  . Cancer Neg Hx   . Heart disease Neg Hx   . Diabetes Neg Hx   . Miscarriages / Stillbirths Neg Hx    Social History  Substance Use Topics  . Smoking status: Former Smoker    Packs/day: 0.50    Years: 50.00    Types: Cigarettes    Quit date: 11/27/1985  . Smokeless tobacco: Never Used  . Alcohol use No    Interim medical history since last visit reviewed. Allergies and medications reviewed  Review of Systems Per HPI unless specifically indicated above     Objective:    BP 124/82   Pulse 82   Temp  97.8 F (36.6 C) (Oral)   Resp 14   Wt 158 lb (71.7 kg)   SpO2 97%   BMI 22.67 kg/m   Wt Readings from Last 3 Encounters:  10/20/16 158 lb (71.7 kg)  02/24/16 160 lb 9.6 oz (72.8 kg)  11/28/15 158 lb (71.7 kg)    Physical Exam  Constitutional: He appears well-developed and well-nourished. No distress.  HENT:  Head: Normocephalic and atraumatic.  Right Ear: External ear normal.  Left Ear: External ear normal.  Nose: Rhinorrhea present.  Mouth/Throat: No oropharyngeal exudate.  Eyes: Right eye exhibits no discharge. Left eye exhibits no discharge.  Cardiovascular: Normal rate and regular rhythm.   Pulmonary/Chest: Effort normal and breath sounds normal. No respiratory distress. He has no wheezes.  Lymphadenopathy:    He has no cervical adenopathy.  Neurological: He is alert.  Poor historian; no tremors or tics  Skin: He is not diaphoretic.  Psychiatric: His mood appears not anxious. He is not agitated and not slowed. Cognition and memory are impaired. He does not exhibit a depressed mood. He exhibits abnormal recent memory.    Results for orders placed or performed in visit on 10/20/16  POCT Influenza A/B  Result Value Ref Range   Influenza A, POC Positive (A) Negative   Influenza B, POC Negative Negative      Assessment & Plan:   Problem List Items Addressed This Visit      Respiratory   Influenza A    Hopefully, this is being caught early; I talked with the son and praised him for being for diligent and bringing in his father for care; will start Tamiflu; I personally called hospital and spoke with his wife's nurse to let her know that patient now positive for type A flu and to make sure adequate nursing services would be available to patient's wife, since family will now have two sick parents to care for at home if she is discharged today; rest, hydration, cautioned about risk of secondary pneumonia, reasons to take him to the ER or urgent care; son agrees with plan        Relevant Medications   oseltamivir (TAMIFLU) 75 MG capsule    Other Visit Diagnoses    Stuffy and runny nose    -  Primary   Relevant Orders   POCT Influenza A/B (Completed)      Follow up plan: No Follow-up on file.  An after-visit summary was printed and given to the patient at check-out.  Please see the patient instructions which may contain other information and recommendations beyond what is mentioned above in the assessment and plan.  Meds ordered this encounter  Medications  . oseltamivir (TAMIFLU) 75 MG capsule    Sig: Take 1 capsule (75 mg total) by mouth 2 (two) times daily.    Dispense:  10 capsule    Refill:  0    Orders Placed This Encounter  Procedures  . POCT Influenza A/B

## 2016-10-24 DIAGNOSIS — J101 Influenza due to other identified influenza virus with other respiratory manifestations: Secondary | ICD-10-CM | POA: Insufficient documentation

## 2016-10-24 NOTE — Assessment & Plan Note (Signed)
Hopefully, this is being caught early; I talked with the son and praised him for being for diligent and bringing in his father for care; will start Tamiflu; I personally called hospital and spoke with his wife's nurse to let her know that patient now positive for type A flu and to make sure adequate nursing services would be available to patient's wife, since family will now have two sick parents to care for at home if she is discharged today; rest, hydration, cautioned about risk of secondary pneumonia, reasons to take him to the ER or urgent care; son agrees with plan

## 2016-12-21 ENCOUNTER — Encounter: Payer: Self-pay | Admitting: Family Medicine

## 2016-12-21 ENCOUNTER — Ambulatory Visit (INDEPENDENT_AMBULATORY_CARE_PROVIDER_SITE_OTHER): Payer: Medicare Other | Admitting: Family Medicine

## 2016-12-21 ENCOUNTER — Telehealth: Payer: Self-pay | Admitting: Family Medicine

## 2016-12-21 VITALS — BP 118/74 | HR 69 | Temp 97.6°F | Resp 16 | Wt 154.2 lb

## 2016-12-21 DIAGNOSIS — R292 Abnormal reflex: Secondary | ICD-10-CM | POA: Insufficient documentation

## 2016-12-21 DIAGNOSIS — I10 Essential (primary) hypertension: Secondary | ICD-10-CM

## 2016-12-21 DIAGNOSIS — R739 Hyperglycemia, unspecified: Secondary | ICD-10-CM

## 2016-12-21 DIAGNOSIS — Z87438 Personal history of other diseases of male genital organs: Secondary | ICD-10-CM | POA: Diagnosis not present

## 2016-12-21 DIAGNOSIS — F0391 Unspecified dementia with behavioral disturbance: Secondary | ICD-10-CM | POA: Diagnosis not present

## 2016-12-21 DIAGNOSIS — Z5181 Encounter for therapeutic drug level monitoring: Secondary | ICD-10-CM | POA: Diagnosis not present

## 2016-12-21 DIAGNOSIS — Z23 Encounter for immunization: Secondary | ICD-10-CM

## 2016-12-21 LAB — COMPLETE METABOLIC PANEL WITH GFR
ALBUMIN: 4.1 g/dL (ref 3.6–5.1)
ALT: 8 U/L — ABNORMAL LOW (ref 9–46)
AST: 12 U/L (ref 10–35)
Alkaline Phosphatase: 53 U/L (ref 40–115)
BUN: 13 mg/dL (ref 7–25)
CALCIUM: 9.5 mg/dL (ref 8.6–10.3)
CHLORIDE: 108 mmol/L (ref 98–110)
CO2: 30 mmol/L (ref 20–31)
CREATININE: 1.03 mg/dL (ref 0.70–1.18)
GFR, Est African American: 81 mL/min (ref >=60)
GFR, Est Non African American: 70 mL/min (ref >=60)
GLUCOSE: 122 mg/dL — AB (ref 65–99)
POTASSIUM: 4.1 mmol/L (ref 3.5–5.3)
SODIUM: 143 mmol/L (ref 135–146)
Total Bilirubin: 0.7 mg/dL (ref 0.2–1.2)
Total Protein: 7.1 g/dL (ref 6.1–8.1)

## 2016-12-21 LAB — CBC WITH DIFFERENTIAL/PLATELET
BASOS ABS: 0 {cells}/uL (ref 0–200)
Basophils Relative: 0 %
EOS ABS: 70 {cells}/uL (ref 15–500)
EOS PCT: 1 %
HCT: 40.6 % (ref 38.5–50.0)
HEMOGLOBIN: 13 g/dL — AB (ref 13.2–17.1)
LYMPHS ABS: 1330 {cells}/uL (ref 850–3900)
Lymphocytes Relative: 19 %
MCH: 26.6 pg — AB (ref 27.0–33.0)
MCHC: 32 g/dL (ref 32.0–36.0)
MCV: 83 fL (ref 80.0–100.0)
MONO ABS: 630 {cells}/uL (ref 200–950)
MPV: 9.3 fL (ref 7.5–12.5)
Monocytes Relative: 9 %
NEUTROS PCT: 71 %
Neutro Abs: 4970 cells/uL (ref 1500–7800)
Platelets: 310 10*3/uL (ref 140–400)
RBC: 4.89 MIL/uL (ref 4.20–5.80)
RDW: 17.3 % — ABNORMAL HIGH (ref 11.0–15.0)
WBC: 7 10*3/uL (ref 3.8–10.8)

## 2016-12-21 LAB — LIPID PANEL
CHOLESTEROL: 184 mg/dL (ref <200)
HDL: 59 mg/dL (ref >40)
LDL Cholesterol: 105 mg/dL — ABNORMAL HIGH (ref <100)
Total CHOL/HDL Ratio: 3.1 Ratio (ref <5.0)
Triglycerides: 99 mg/dL (ref <150)
VLDL: 20 mg/dL (ref <30)

## 2016-12-21 MED ORDER — LORAZEPAM 0.5 MG PO TABS
0.2500 mg | ORAL_TABLET | Freq: Two times a day (BID) | ORAL | 0 refills | Status: DC | PRN
Start: 2016-12-21 — End: 2017-09-13

## 2016-12-21 NOTE — Assessment & Plan Note (Signed)
Monitor labs 

## 2016-12-21 NOTE — Patient Instructions (Signed)
Start the new medicine Be careful for falls, confusion Make sure he is monitored while awake during the day Consider a referral to see a neurologist Call me if needed

## 2016-12-21 NOTE — Assessment & Plan Note (Signed)
excellent

## 2016-12-21 NOTE — Assessment & Plan Note (Signed)
Check TSH 

## 2016-12-21 NOTE — Assessment & Plan Note (Addendum)
Check labs; with the behavioral changes, I think a very low dose of benzo would be an appropriate trial; family/caregiver to dose, son assures me patient won't have access to the bottle; cautioned about risk of falls; watch him to see how he reacts to the medicine; other strategies for dealing with aggression reviewed; 36 hour day book recommended; discussed seeing neurology

## 2016-12-21 NOTE — Telephone Encounter (Signed)
Dennis Sandoval, son of patient called stating that he would like a call back. He is really concerned about his parents. Dad seems to be acting out and getting agitated and son is concerned about his moms well being. Dennis Sandoval asked for a return call as soon as possible for advise.
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
## 2016-12-21 NOTE — Assessment & Plan Note (Signed)
Last A1c was excellent 

## 2016-12-21 NOTE — Telephone Encounter (Signed)
Please call son and put patient on the schedule for today; thank you

## 2016-12-21 NOTE — Telephone Encounter (Signed)
Pt scheduled for 11:40 AM for today

## 2016-12-22 LAB — T4, FREE: Free T4: 1.1 ng/dL (ref 0.8–1.8)

## 2016-12-22 LAB — URINALYSIS W MICROSCOPIC + REFLEX CULTURE
BACTERIA UA: NONE SEEN [HPF]
BILIRUBIN URINE: NEGATIVE
CRYSTALS: NONE SEEN [HPF]
Casts: NONE SEEN [LPF]
GLUCOSE, UA: NEGATIVE
Hgb urine dipstick: NEGATIVE
KETONES UR: NEGATIVE
LEUKOCYTES UA: NEGATIVE
Nitrite: NEGATIVE
PROTEIN: NEGATIVE
SQUAMOUS EPITHELIAL / LPF: NONE SEEN [HPF] (ref <=5)
Specific Gravity, Urine: 1.022 (ref 1.001–1.035)
WBC UA: NONE SEEN WBC/HPF (ref <=5)
Yeast: NONE SEEN [HPF]
pH: 6.5 (ref 5.0–8.0)

## 2016-12-22 LAB — PSA: PSA: 0.1 ng/mL (ref <=4.0)

## 2016-12-22 LAB — TSH: TSH: 0.75 m[IU]/L (ref 0.40–4.50)

## 2016-12-31 ENCOUNTER — Emergency Department
Admission: EM | Admit: 2016-12-31 | Discharge: 2017-01-01 | Disposition: A | Discharge status: Other Acute Inpatient Hospital | Payer: Medicare Other | Attending: Emergency Medicine | Admitting: Emergency Medicine

## 2016-12-31 ENCOUNTER — Encounter: Payer: Self-pay | Admitting: Emergency Medicine

## 2016-12-31 DIAGNOSIS — I10 Essential (primary) hypertension: Secondary | ICD-10-CM | POA: Insufficient documentation

## 2016-12-31 DIAGNOSIS — F0391 Unspecified dementia with behavioral disturbance: Secondary | ICD-10-CM | POA: Insufficient documentation

## 2016-12-31 DIAGNOSIS — Z87891 Personal history of nicotine dependence: Secondary | ICD-10-CM | POA: Diagnosis not present

## 2016-12-31 DIAGNOSIS — Z7982 Long term (current) use of aspirin: Secondary | ICD-10-CM | POA: Diagnosis not present

## 2016-12-31 DIAGNOSIS — Z79899 Other long term (current) drug therapy: Secondary | ICD-10-CM | POA: Diagnosis not present

## 2016-12-31 DIAGNOSIS — R4182 Altered mental status, unspecified: Secondary | ICD-10-CM | POA: Diagnosis present

## 2016-12-31 LAB — COMPREHENSIVE METABOLIC PANEL
ALK PHOS: 55 U/L (ref 38–126)
ALT: 13 U/L — AB (ref 17–63)
AST: 20 U/L (ref 15–41)
Albumin: 4.1 g/dL (ref 3.5–5.0)
Anion gap: 5 (ref 5–15)
BUN: 19 mg/dL (ref 6–20)
CHLORIDE: 103 mmol/L (ref 101–111)
CO2: 28 mmol/L (ref 22–32)
CREATININE: 1.19 mg/dL (ref 0.61–1.24)
Calcium: 9.1 mg/dL (ref 8.9–10.3)
GFR, EST NON AFRICAN AMERICAN: 58 mL/min — AB (ref >60)
Glucose, Bld: 187 mg/dL — ABNORMAL HIGH (ref 65–99)
Potassium: 3.8 mmol/L (ref 3.5–5.1)
Sodium: 136 mmol/L (ref 135–145)
Total Bilirubin: 0.6 mg/dL (ref 0.3–1.2)
Total Protein: 7.8 g/dL (ref 6.5–8.1)

## 2016-12-31 LAB — URINE DRUG SCREEN, QUALITATIVE (ARMC ONLY)
AMPHETAMINES, UR SCREEN: NOT DETECTED
Barbiturates, Ur Screen: NOT DETECTED
Benzodiazepine, Ur Scrn: POSITIVE — AB
Cannabinoid 50 Ng, Ur ~~LOC~~: NOT DETECTED
Cocaine Metabolite,Ur ~~LOC~~: NOT DETECTED
MDMA (ECSTASY) UR SCREEN: NOT DETECTED
METHADONE SCREEN, URINE: NOT DETECTED
OPIATE, UR SCREEN: NOT DETECTED
PHENCYCLIDINE (PCP) UR S: NOT DETECTED
Tricyclic, Ur Screen: NOT DETECTED

## 2016-12-31 LAB — CBC
HCT: 38.7 % — ABNORMAL LOW (ref 40.0–52.0)
Hemoglobin: 13 g/dL (ref 13.0–18.0)
MCH: 27.4 pg (ref 26.0–34.0)
MCHC: 33.6 g/dL (ref 32.0–36.0)
MCV: 81.7 fL (ref 80.0–100.0)
PLATELETS: 319 10*3/uL (ref 150–440)
RBC: 4.74 MIL/uL (ref 4.40–5.90)
RDW: 18.6 % — AB (ref 11.5–14.5)
WBC: 11.7 10*3/uL — ABNORMAL HIGH (ref 3.8–10.6)

## 2016-12-31 LAB — ACETAMINOPHEN LEVEL: Acetaminophen (Tylenol), Serum: <10 ug/mL — ABNORMAL LOW (ref 10–30)

## 2016-12-31 LAB — SALICYLATE LEVEL: Salicylate Lvl: <7 mg/dL (ref 2.8–30.0)

## 2016-12-31 LAB — ETHANOL: Alcohol, Ethyl (B): <5 mg/dL (ref <5)
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
## 2017-01-01 LAB — URINALYSIS, ROUTINE W REFLEX MICROSCOPIC
BACTERIA UA: NONE SEEN
BILIRUBIN URINE: NEGATIVE
GLUCOSE, UA: 50 mg/dL — AB
KETONES UR: NEGATIVE mg/dL
LEUKOCYTES UA: NEGATIVE
NITRITE: NEGATIVE
PROTEIN: 30 mg/dL — AB
SQUAMOUS EPITHELIAL / LPF: NONE SEEN
Specific Gravity, Urine: 1.024 (ref 1.005–1.030)
pH: 5 (ref 5.0–8.0)

## 2017-01-01 MED ORDER — LORAZEPAM 0.5 MG PO TABS
0.5000 mg | ORAL_TABLET | Freq: Once | ORAL | Status: AC
  Administered 2017-01-01: 0.5 mg via ORAL
  Filled 2017-01-01: qty 1

## 2017-01-01 MED ORDER — ZIPRASIDONE MESYLATE 20 MG IM SOLR
15.0000 mg | Freq: Once | INTRAMUSCULAR | Status: AC
  Administered 2017-01-01: 15 mg via INTRAMUSCULAR
  Filled 2017-01-01: qty 20

## 2017-01-01 MED ORDER — ESCITALOPRAM OXALATE 10 MG PO TABS
5.0000 mg | ORAL_TABLET | Freq: Every day | ORAL | Status: DC
  Administered 2017-01-01: 5 mg via ORAL
  Filled 2017-01-01: qty 1

## 2017-01-01 MED ORDER — HALOPERIDOL LACTATE 5 MG/ML IJ SOLN
INTRAMUSCULAR | Status: AC
  Administered 2017-01-01: 2.5 mg via INTRAMUSCULAR
  Filled 2017-01-01: qty 1

## 2017-01-01 MED ORDER — HALOPERIDOL LACTATE 5 MG/ML IJ SOLN
2.5000 mg | Freq: Once | INTRAMUSCULAR | Status: AC
  Administered 2017-01-01: 2.5 mg via INTRAMUSCULAR

## 2017-01-01 MED ORDER — DONEPEZIL HCL 5 MG PO TABS
10.0000 mg | ORAL_TABLET | Freq: Every day | ORAL | Status: DC
  Administered 2017-01-01: 10 mg via ORAL
  Filled 2017-01-01: qty 2

## 2017-01-01 MED ORDER — ASPIRIN EC 81 MG PO TBEC
81.0000 mg | DELAYED_RELEASE_TABLET | Freq: Every day | ORAL | Status: DC
  Administered 2017-01-01: 81 mg via ORAL
  Filled 2017-01-01: qty 1

## 2017-01-01 MED ORDER — MEMANTINE HCL 5 MG PO TABS
5.0000 mg | ORAL_TABLET | Freq: Two times a day (BID) | ORAL | Status: DC
  Administered 2017-01-01 (×2): 5 mg via ORAL
  Filled 2017-01-01 (×2): qty 1

## 2017-01-01 MED ORDER — LISINOPRIL 20 MG PO TABS
20.0000 mg | ORAL_TABLET | Freq: Every day | ORAL | Status: DC
  Administered 2017-01-01: 20 mg via ORAL
  Filled 2017-01-01: qty 1
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
## 2017-01-01 NOTE — ED Notes (Signed)
SOC Dr Hermelinda MedicusAlvarado

## 2017-01-01 NOTE — ED Notes (Addendum)
Pt awake at this time alert to self asking for keys to go home. Pt oriented to place and time and informed that he will not be discharged at this time. Informed that his son's brought him here bc they were concerned for him. Pt agreeable. Pleasant at this time. Has eat the majority of his breakfast and given remote to watch TV.
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
## 2017-01-01 NOTE — ED Notes (Signed)
Doretha, daughter-in-law, called to verifiy meds and to be additional contact for care, [phone]
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
## 2017-01-01 NOTE — Progress Notes (Signed)
LCSW consulted with ED RN patient remains sleeping at this time. LCSW consulted with TTS and will contact family to let them know patient has [facility]  LCSW called Family spoke to Casimiro NeedleMichael and his wife [phone] and explained that their [facility] be [facility] and provided them with facility phone #. They will try to come for a visit before patient leaves.  Delta Air LinesClaudine Alana Dayton LCSW [phone]

## 2017-01-01 NOTE — ED Notes (Signed)
Pt given breatkfast tray

## 2017-01-01 NOTE — ED Notes (Signed)
Pt walking out of room, security and tech attempting to redirect patient.

## 2017-01-01 NOTE — ED Notes (Signed)
Pt sleeping soundly, even and unlabored respirations, will continue to monitor  

## 2017-01-01 NOTE — [facility] Counselor (Signed)
[facility]:  [facility] - [phone]  [facility] - [phone]  [facility] - [phone]  [facility] - [phone]  [facility] - [phone]  [facility] [phone]- [phone] ext [phone]

## 2017-01-01 NOTE — ED Notes (Signed)
Pt given lunch tray. Pt c/o of sandwhich being cold nurse warms sandwhich pt eating at this time. Pt reminded of where he is and why he can not leave. Pt agreeable. Nurse informs pt that one of his sons is coming to visit him today.

## 2017-01-01 NOTE — BH Assessment (Signed)
[facility] has been [facility]. Accepting physician is Dr. Cathie HoopsJoseph Godfrey.  Call report to [phone].  Representative was Dana Corporationracie.  ER Staff is aware of it Christen Bame(Ronnie, ER Sect.; Dr. Roxan Hockeyobinson, ER MD & Audree Camelenia, Patient's Nurse)     Social Worker contacted family to informed them of the patient being accepted [facility].

## 2017-01-01 NOTE — ED Notes (Signed)
Pt continues to yell and pace, nonviolent but very agitated, pt unable to orient to any concrete facts

## 2017-01-01 NOTE — ED Notes (Signed)
Pt increasing agitation, repeating requests to leave and see "my wife and kids, because I've done nothing wrong"  Dr York CeriseForbach and Dr Hermelinda MedicusAlvarado (telepsych) notified
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
## 2017-01-01 NOTE — ED Notes (Signed)
Pt walking out of room trying to leave.  Sheralyn Boatmanoni, tech, Jeannett SeniorStephen, Scientific laboratory technicianN and security officer tried verbally redirecting patient.  Patient becoming agitated and yelling.  Patient refusing to return to room.  Verbal order for 15mg  Geodon given and administered.  Will continue to monitor patient.

## 2017-01-01 NOTE — ED Provider Notes (Signed)
-----------------------------------------   7:58 AM on 01/01/2017 -----------------------------------------   Blood pressure (!) 149/86, pulse 100, temperature 97.8 F (36.6 C), temperature source Oral, resp. rate 16, height 6' (1.829 m), weight 154 lb (69.9 kg), SpO2 99 %.  The patient had no acute events since last update.  Calm and cooperative at this time.  Disposition is pending Psychiatry/Behavioral Medicine team recommendations.     Sharman CheekPhillip Xeng Kucher, MD 01/01/17 [phone]

## 2017-02-07 ENCOUNTER — Telehealth: Payer: Self-pay

## 2017-02-07 NOTE — Telephone Encounter (Signed)
[place] [facility] called states wants to see about getting letter about patients last visit on how he was and acting.  Care home is stating he needs more care? Contact  kristin doss with any questions [phone]

## 2017-02-07 NOTE — Telephone Encounter (Signed)
I spoke with daughter-in-law today; [place] I'll be glad to write letter, but the best source of information is from the provider who cared for him in [place]

## 2017-02-08 NOTE — Telephone Encounter (Signed)
Miel, do we need any sort of release of information for this? Thank you

## 2017-02-08 NOTE — Telephone Encounter (Signed)
Left voicemail with Mrs. Doss

## 2017-02-09 NOTE — Telephone Encounter (Signed)
There is already a release form from [employer] [place] [employer] that is scanned in patient's chart under the media tab.

## 2017-03-07 ENCOUNTER — Other Ambulatory Visit: Payer: Self-pay | Admitting: Family Medicine

## 2017-03-10 DIAGNOSIS — E559 Vitamin D deficiency, unspecified: Secondary | ICD-10-CM | POA: Diagnosis not present

## 2017-03-10 DIAGNOSIS — Z87440 Personal history of urinary (tract) infections: Secondary | ICD-10-CM | POA: Diagnosis not present

## 2017-03-10 DIAGNOSIS — R251 Tremor, unspecified: Secondary | ICD-10-CM | POA: Diagnosis not present

## 2017-03-10 DIAGNOSIS — I10 Essential (primary) hypertension: Secondary | ICD-10-CM | POA: Diagnosis not present

## 2017-03-24 DIAGNOSIS — Z79899 Other long term (current) drug therapy: Secondary | ICD-10-CM | POA: Diagnosis not present

## 2017-03-24 DIAGNOSIS — E559 Vitamin D deficiency, unspecified: Secondary | ICD-10-CM | POA: Diagnosis not present

## 2017-04-18 DIAGNOSIS — E559 Vitamin D deficiency, unspecified: Secondary | ICD-10-CM | POA: Diagnosis not present

## 2017-04-18 DIAGNOSIS — I10 Essential (primary) hypertension: Secondary | ICD-10-CM | POA: Diagnosis not present

## 2017-04-18 DIAGNOSIS — R609 Edema, unspecified: Secondary | ICD-10-CM | POA: Diagnosis not present

## 2017-04-18 DIAGNOSIS — R251 Tremor, unspecified: Secondary | ICD-10-CM | POA: Diagnosis not present

## 2017-05-05 DIAGNOSIS — R251 Tremor, unspecified: Secondary | ICD-10-CM | POA: Diagnosis not present

## 2017-05-05 DIAGNOSIS — R609 Edema, unspecified: Secondary | ICD-10-CM | POA: Diagnosis not present

## 2017-05-05 DIAGNOSIS — I10 Essential (primary) hypertension: Secondary | ICD-10-CM | POA: Diagnosis not present

## 2017-05-05 DIAGNOSIS — E559 Vitamin D deficiency, unspecified: Secondary | ICD-10-CM | POA: Diagnosis not present

## 2017-05-09 DIAGNOSIS — R609 Edema, unspecified: Secondary | ICD-10-CM | POA: Diagnosis not present

## 2017-05-09 DIAGNOSIS — R251 Tremor, unspecified: Secondary | ICD-10-CM | POA: Diagnosis not present

## 2017-05-09 DIAGNOSIS — I10 Essential (primary) hypertension: Secondary | ICD-10-CM | POA: Diagnosis not present

## 2017-06-07 DIAGNOSIS — Z79899 Other long term (current) drug therapy: Secondary | ICD-10-CM | POA: Diagnosis not present

## 2017-06-09 DIAGNOSIS — R609 Edema, unspecified: Secondary | ICD-10-CM | POA: Diagnosis not present

## 2017-06-09 DIAGNOSIS — R251 Tremor, unspecified: Secondary | ICD-10-CM | POA: Diagnosis not present

## 2017-06-09 DIAGNOSIS — I10 Essential (primary) hypertension: Secondary | ICD-10-CM | POA: Diagnosis not present

## 2017-06-09 DIAGNOSIS — R05 Cough: Secondary | ICD-10-CM | POA: Diagnosis not present

## 2017-06-09 DIAGNOSIS — J45909 Unspecified asthma, uncomplicated: Secondary | ICD-10-CM | POA: Diagnosis not present

## 2017-06-19 ENCOUNTER — Emergency Department
Admission: EM | Admit: 2017-06-19 | Discharge: 2017-06-20 | Disposition: A | Discharge status: Home / Self Care | Payer: Medicare Other | Attending: Emergency Medicine | Admitting: Emergency Medicine

## 2017-06-19 ENCOUNTER — Encounter: Payer: Self-pay | Admitting: Emergency Medicine

## 2017-06-19 DIAGNOSIS — Z7982 Long term (current) use of aspirin: Secondary | ICD-10-CM | POA: Diagnosis not present

## 2017-06-19 DIAGNOSIS — F17210 Nicotine dependence, cigarettes, uncomplicated: Secondary | ICD-10-CM | POA: Diagnosis not present

## 2017-06-19 DIAGNOSIS — Z0000 Encounter for general adult medical examination without abnormal findings: Secondary | ICD-10-CM | POA: Diagnosis present

## 2017-06-19 DIAGNOSIS — F0391 Unspecified dementia with behavioral disturbance: Secondary | ICD-10-CM | POA: Diagnosis not present

## 2017-06-19 DIAGNOSIS — R451 Restlessness and agitation: Secondary | ICD-10-CM | POA: Diagnosis not present

## 2017-06-19 DIAGNOSIS — I10 Essential (primary) hypertension: Secondary | ICD-10-CM | POA: Insufficient documentation

## 2017-06-19 DIAGNOSIS — Z79899 Other long term (current) drug therapy: Secondary | ICD-10-CM | POA: Diagnosis not present

## 2017-06-19 HISTORY — DX: Dementia in other diseases classified elsewhere without behavioral disturbance: F02.80

## 2017-06-19 HISTORY — DX: Alzheimer's disease, unspecified: G30.9
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
## 2017-06-20 NOTE — Discharge Instructions (Signed)
Please have the patient follow up with his primary care physician.

## 2017-06-23 DIAGNOSIS — E559 Vitamin D deficiency, unspecified: Secondary | ICD-10-CM | POA: Diagnosis not present

## 2017-06-23 DIAGNOSIS — R251 Tremor, unspecified: Secondary | ICD-10-CM | POA: Diagnosis not present

## 2017-06-23 DIAGNOSIS — R609 Edema, unspecified: Secondary | ICD-10-CM | POA: Diagnosis not present

## 2017-06-23 DIAGNOSIS — I10 Essential (primary) hypertension: Secondary | ICD-10-CM | POA: Diagnosis not present

## 2017-06-29 ENCOUNTER — Emergency Department: Payer: Medicare Other

## 2017-06-29 ENCOUNTER — Emergency Department
Admission: EM | Admit: 2017-06-29 | Discharge: 2017-06-29 | Disposition: A | Discharge status: Home / Self Care | Payer: Medicare Other | Attending: Emergency Medicine | Admitting: Emergency Medicine

## 2017-06-29 DIAGNOSIS — F0390 Unspecified dementia without behavioral disturbance: Secondary | ICD-10-CM

## 2017-06-29 DIAGNOSIS — Z79899 Other long term (current) drug therapy: Secondary | ICD-10-CM | POA: Diagnosis not present

## 2017-06-29 DIAGNOSIS — R319 Hematuria, unspecified: Secondary | ICD-10-CM | POA: Insufficient documentation

## 2017-06-29 DIAGNOSIS — G309 Alzheimer's disease, unspecified: Secondary | ICD-10-CM | POA: Diagnosis not present

## 2017-06-29 DIAGNOSIS — I10 Essential (primary) hypertension: Secondary | ICD-10-CM | POA: Diagnosis not present

## 2017-06-29 DIAGNOSIS — Z7982 Long term (current) use of aspirin: Secondary | ICD-10-CM | POA: Diagnosis not present

## 2017-06-29 DIAGNOSIS — N3090 Cystitis, unspecified without hematuria: Secondary | ICD-10-CM

## 2017-06-29 DIAGNOSIS — Z87891 Personal history of nicotine dependence: Secondary | ICD-10-CM | POA: Diagnosis not present

## 2017-06-29 DIAGNOSIS — N3091 Cystitis, unspecified with hematuria: Secondary | ICD-10-CM | POA: Insufficient documentation

## 2017-06-29 LAB — URINALYSIS, COMPLETE (UACMP) WITH MICROSCOPIC
Bilirubin Urine: NEGATIVE
GLUCOSE, UA: NEGATIVE mg/dL
KETONES UR: NEGATIVE mg/dL
Nitrite: NEGATIVE
PH: 7 (ref 5.0–8.0)
PROTEIN: 100 mg/dL — AB
SQUAMOUS EPITHELIAL / LPF: NONE SEEN
Specific Gravity, Urine: 1.012 (ref 1.005–1.030)

## 2017-06-29 MED ORDER — CEPHALEXIN 500 MG PO CAPS: 500.0000 mg | ORAL_CAPSULE | Freq: Three times a day (TID) | ORAL | 0 refills | Status: DC

## 2017-06-29 MED ORDER — CEPHALEXIN 500 MG PO CAPS
500.0000 mg | ORAL_CAPSULE | Freq: Once | ORAL | Status: AC
  Administered 2017-06-29: 500 mg via ORAL
  Filled 2017-06-29: qty 1

## 2017-06-29 NOTE — ED Provider Notes (Signed)
[facility] Emergency Department Provider Note  ____________________________________________   First MD Initiated Contact with Patient 06/29/17 2204     (approximate)  I have reviewed the triage vital signs and the nursing notes.   HISTORY  Chief Complaint Hematuria  Level 5 caveat:  history/ROS limited by chronic dementia  HPI [name] is a 76 y.o. male with a history of dementia who presents from his nursing facility for evaluation of acute onset hematuria.  He has not had blood in his urine in the past but when the assistants at the facility helped into the toilet they noticed bright red blood when he urinated.  The patient denies any pain at all and is at his baseline according to family.  His vital signs are stable.  He specifically denies chest pain, shortness of breath, nausea, vomiting, abdominal pain, flank pain, and dysuria.  It sounds as if his hematuria is intermittent in because his family said that they helped him go to the bathroom since he has been in the emergency department and they did not see any blood.  Nothing in particular makes the patient's symptoms better nor worse.  It is difficult to assess the severity of the symptoms given that it N one time at [facility]   Past Medical History:  Diagnosis Date  . Alzheimer disease 06/16/2015  . Dementia   . Hypertension     Patient Active Problem List   Diagnosis Date Noted  . 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 History of BPH 12/21/2016  . Medication monitoring encounter 01/28/2016  . Hyperglycemia 01/28/2016  . Preventative health care 12/21/2015  . Need for pneumococcal vaccination 12/21/2015  . Screening for AAA (abdominal aortic aneurysm) 12/01/2015  . Smoking history 11/28/2015  . Dementia 07/25/2015  . Benign hypertension 07/25/2015    Past Surgical History:  Procedure Laterality Date  . PROSTATE SURGERY      Prior to Admission medications   Medication Sig Start Date  End Date Taking? Authorizing Provider  aspirin EC 81 MG tablet Take 1 tablet (81 mg total) by mouth daily. 12/21/15   Kerman Passey, MD  cephALEXin (KEFLEX) 500 MG capsule Take 1 capsule (500 mg total) by mouth 3 (three) times daily. 06/29/17   Loleta Rose, MD  donepezil (ARICEPT) 10 MG tablet Take 1 tablet (10 mg total) by mouth at bedtime. 01/14/16   Kerman Passey, MD  escitalopram (LEXAPRO) 10 MG tablet Take 0.5 tablets (5 mg total) by mouth daily. (for mood) 01/14/16   Lada, Janit Bern, MD  lisinopril (PRINIVIL,ZESTRIL) 20 MG tablet TAKE ONE TABLET BY MOUTH ONCE DAILY 10/17/16   Lada, Janit Bern, MD  LORazepam (ATIVAN) 0.5 MG tablet Take 0.5 tablets (0.25 mg total) by mouth 2 (two) times daily as needed for anxiety. 12/21/16   Kerman Passey, MD  memantine (NAMENDA) 5 MG tablet Take 1 tablet (5 mg total) by mouth 2 (two) times daily. 04/14/16   Kerman Passey, MD    Allergies Patient has no known allergies.  Family History  Problem Relation Age of Onset  . Dementia Mother   . Dementia Father   . Cancer Brother        not sure what type  . Alcohol abuse Brother   . Heart disease Neg Hx   . Diabetes Neg Hx   . Stroke Neg Hx     Social History Social History  Substance Use Topics  . Smoking status: Former Smoker    Packs/day:  0.50    Years: 50.00    Types: Cigarettes    Quit date: 11/27/1985  . Smokeless tobacco: Never Used  . Alcohol use No    Review of Systems Level 5 caveat:  history/ROS limited by chronic dementia, but the patient denies chest pain, shortness of breath, nausea or vomiting, abdominal pain, flank pain, and dysuria ____________________________________________   PHYSICAL EXAM:  VITAL SIGNS: ED Triage Vitals  Enc Vitals Group     BP 06/29/17 2042 (!) 141/83     Pulse Rate 06/29/17 2042 87     Resp 06/29/17 2042 20     Temp 06/29/17 2042 97.8 F (36.6 C)     Temp Source 06/29/17 2042 Oral     SpO2 06/29/17 2042 96 %     Weight 06/29/17 2043 81.6 kg (180  lb)     Height 06/29/17 2043 1.829 m (6')     Head Circumference --      Peak Flow --      Pain Score 06/29/17 2042 0     Pain Loc --      Pain Edu? --      Excl. in GC? --     Constitutional: Alert, Well-appearing, in no acute distress, sleeping but awakens easily to voice and is pleasant and coherent Eyes: Conjunctivae are normal.  Head: Atraumatic. Nose: No congestion/rhinnorhea. Mouth/Throat: Mucous membranes are moist. Neck: No stridor.  No meningeal signs.   Cardiovascular: Normal rate, regular rhythm. Good peripheral circulation. Grossly normal heart sounds. Respiratory: Normal respiratory effort.  No retractions. Lungs CTAB. Gastrointestinal: Soft and nontender. No distention.  Musculoskeletal: No lower extremity tenderness nor edema. No gross deformities of extremities. Neurologic:  Normal speech and language. No gross focal neurologic deficits are appreciated.  Skin:  Skin is warm, dry and intact. No rash noted.   ____________________________________________   LABS (all labs ordered are listed, but only abnormal results are displayed)  Labs Reviewed  URINALYSIS, COMPLETE (UACMP) WITH MICROSCOPIC - Abnormal; Notable for the following:       Result Value   Color, Urine YELLOW (*)    APPearance CLOUDY (*)    Hgb urine dipstick LARGE (*)    Protein, ur 100 (*)    Leukocytes, UA LARGE (*)    Bacteria, UA RARE (*)    All other components within normal limits  URINE CULTURE   ____________________________________________  EKG  None - EKG not ordered by ED physician ____________________________________________  RADIOLOGY   Ct Renal Stone Study  Result Date: 06/29/2017 CLINICAL DATA:  Hematuria today. EXAM: CT ABDOMEN AND PELVIS WITHOUT CONTRAST TECHNIQUE: Multidetector CT imaging of the abdomen and pelvis was performed following the standard protocol without IV contrast. COMPARISON:  None. FINDINGS: Lower chest: Breathing motion artifact. Lower lobe bronchial  thickening with mucoid impaction in the right lower lobe. Scattered atelectasis. Coronary artery calcifications. Heart is normal in size. Hepatobiliary: No focal hepatic lesion allowing for lack contrast and motion artifact. Gallbladder physiologically distended, no calcified stone. No biliary dilatation. Pancreas: No ductal dilatation or inflammation. Mild motion artifact. Spleen: Normal in size without focal abnormality. Adrenals/Urinary Tract: No adrenal nodule. Questionable right perinephric edema and prominence of the right renal collecting system, partially obscured by motion. No definite left hydronephrosis. No urolithiasis. There is marked urinary bladder wall thickening with perivesicular fat stranding. Stomach/Bowel: Stomach physiologically distended. No small bowel obstruction or inflammation. Moderate volume of colonic stool. Normal appendix extends into the right inguinal canal. Motion artifact partially obscures more detailed evaluation.  Vascular/Lymphatic: Atherosclerosis without aneurysm. No bulky abdominal or pelvic adenopathy. Reproductive: Probable prostatectomy. Edematous appearance of the seminal vesicles, likely secondary. Other: No ascites. Induration of the pelvic fat secondary to bladder inflammation. Tiny fat containing umbilical hernia. No free air. Musculoskeletal: There are no acute or suspicious osseous abnormalities. IMPRESSION: 1. Bladder wall thickening with marked perivesicular inflammation suspicious for urinary tract infection. Possible associated right pyelonephritis, partially obscured by motion. 2. Moderate colonic stool burden, can be seen with constipation. 3. Aortic atherosclerosis and coronary artery calcifications. 4. 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 Electronically Signed   By: Rubye OaksMelanie  Ehinger M.D.   On: 06/29/2017 22:29    ____________________________________________   PROCEDURES  Critical Care  performed: No   Procedure(s) performed:   Procedures   ____________________________________________   INITIAL IMPRESSION / ASSESSMENT AND PLAN / ED COURSE  Pertinent labs & imaging results that were available during my care of the patient were reviewed by me and considered in my medical decision making (see chart for details).  The patient is asymptomatic in terms of pain or symptoms besides the hematuria is seen at the facility.  He did urinate once in the emergency department and was seen to have some blood in the urine.  His CT scan and urinalysis are both consistent with cystitis.  There is no indication of systemic infection and I do not believe that R could be helpful at this time.  I will give him a 12 day course of Keflex and a first dose here for possible pyelonephritis although again he has no signs or symptoms of pyelonephritis.  I encouraged the family to follow up with his PCP and/or urology at the next available opportunity to further evaluate the hematuria.      ____________________________________________  FINAL CLINICAL IMPRESSION(S) / ED DIAGNOSES  Final diagnoses:  Hematuria, unspecified type  Cystitis  Chronic dementia without behavioral disturbance     MEDICATIONS GIVEN DURING THIS VISIT:  Medications  cephALEXin (KEFLEX) capsule 500 mg (500 mg Oral Given 06/29/17 2338)     NEW OUTPATIENT MEDICATIONS STARTED DURING THIS VISIT:  New Prescriptions   CEPHALEXIN (KEFLEX) 500 MG CAPSULE    Take 1 capsule (500 mg total) by mouth 3 (three) times daily.    Modified Medications   No medications on file    Discontinued Medications   No medications on file     Note:  This document was prepared using Dragon voice recognition software and may include unintentional dictation errors.    Loleta RoseForbach, Zadie Deemer, MD 06/29/17 [phone]

## 2017-06-29 NOTE — ED Triage Notes (Signed)
Pt arrives to ED via [facility] from [facility] with c/o hematuria x20 minutes. Pt has h/x of dementia, is at baseline per facility. Pt denies any c/o pain. Pt is alert, oriented to self and situation; RR even, regular, and unlabored.

## 2017-06-29 NOTE — Discharge Instructions (Signed)
As we discussed, Mr. Dennis Sandoval's vital signs are normal and fortunately he is not in any distress or pain.  His CT scan and urinalysis suggest that the blood in his urine as the result of an infection.  We have given him a first dose of antibiotics and a prescription for more antibiotics; please make sure he takes the full course of antibiotics as prescribed.  We recommend that he follows up either with his primary care provider, or with a urologist such as Dr. Apolinar JunesBrandon at the next available opportunity for a follow-up appointment and also to determine if additional workup is necessary to determine the cause of his hematuria (to make sure he does not have a tumor or other cause of acute bleeding rather than just an infection).    Return to the emergency department if you develop new or worsening symptoms that concern you.

## 2017-06-30 DIAGNOSIS — E559 Vitamin D deficiency, unspecified: Secondary | ICD-10-CM | POA: Diagnosis not present

## 2017-06-30 DIAGNOSIS — R251 Tremor, unspecified: Secondary | ICD-10-CM | POA: Diagnosis not present

## 2017-06-30 DIAGNOSIS — I10 Essential (primary) hypertension: Secondary | ICD-10-CM | POA: Diagnosis not present

## 2017-06-30 DIAGNOSIS — R609 Edema, unspecified: Secondary | ICD-10-CM | POA: Diagnosis not present

## 2017-07-02 DIAGNOSIS — Z79899 Other long term (current) drug therapy: Secondary | ICD-10-CM | POA: Diagnosis not present

## 2017-07-02 LAB — URINE CULTURE
Culture: >=100000 — AB
Special Requests: NORMAL

## 2017-07-14 DIAGNOSIS — I10 Essential (primary) hypertension: Secondary | ICD-10-CM | POA: Diagnosis not present

## 2017-07-14 DIAGNOSIS — R319 Hematuria, unspecified: Secondary | ICD-10-CM | POA: Diagnosis not present

## 2017-07-14 DIAGNOSIS — R609 Edema, unspecified: Secondary | ICD-10-CM | POA: Diagnosis not present

## 2017-07-29 DIAGNOSIS — Z7689 Persons encountering health services in other specified circumstances: Secondary | ICD-10-CM | POA: Diagnosis not present

## 2017-08-29 DIAGNOSIS — I10 Essential (primary) hypertension: Secondary | ICD-10-CM | POA: Diagnosis not present

## 2017-08-29 DIAGNOSIS — R251 Tremor, unspecified: Secondary | ICD-10-CM | POA: Diagnosis not present

## 2017-08-29 DIAGNOSIS — R609 Edema, unspecified: Secondary | ICD-10-CM | POA: Diagnosis not present

## 2017-08-29 DIAGNOSIS — R451 Restlessness and agitation: Secondary | ICD-10-CM | POA: Diagnosis not present

## 2017-09-12 ENCOUNTER — Emergency Department
Admission: EM | Admit: 2017-09-12 | Discharge: 2017-09-15 | Disposition: A | Discharge status: Home / Self Care | Payer: Medicare Other | Attending: Emergency Medicine | Admitting: Emergency Medicine

## 2017-09-12 ENCOUNTER — Emergency Department: Payer: Medicare Other

## 2017-09-12 DIAGNOSIS — G309 Alzheimer's disease, unspecified: Secondary | ICD-10-CM | POA: Diagnosis not present

## 2017-09-12 DIAGNOSIS — Z79899 Other long term (current) drug therapy: Secondary | ICD-10-CM | POA: Diagnosis not present

## 2017-09-12 DIAGNOSIS — I10 Essential (primary) hypertension: Secondary | ICD-10-CM | POA: Diagnosis not present

## 2017-09-12 DIAGNOSIS — G301 Alzheimer's disease with late onset: Secondary | ICD-10-CM | POA: Diagnosis not present

## 2017-09-12 DIAGNOSIS — Z7982 Long term (current) use of aspirin: Secondary | ICD-10-CM | POA: Diagnosis not present

## 2017-09-12 DIAGNOSIS — M79641 Pain in right hand: Secondary | ICD-10-CM | POA: Diagnosis not present

## 2017-09-12 DIAGNOSIS — E876 Hypokalemia: Secondary | ICD-10-CM | POA: Diagnosis not present

## 2017-09-12 DIAGNOSIS — Z87891 Personal history of nicotine dependence: Secondary | ICD-10-CM | POA: Diagnosis not present

## 2017-09-12 DIAGNOSIS — R4689 Other symptoms and signs involving appearance and behavior: Secondary | ICD-10-CM | POA: Diagnosis present

## 2017-09-12 DIAGNOSIS — M19042 Primary osteoarthritis, left hand: Secondary | ICD-10-CM | POA: Diagnosis not present

## 2017-09-12 DIAGNOSIS — F0391 Unspecified dementia with behavioral disturbance: Secondary | ICD-10-CM

## 2017-09-12 DIAGNOSIS — F0281 Dementia in other diseases classified elsewhere with behavioral disturbance: Secondary | ICD-10-CM | POA: Diagnosis not present

## 2017-09-12 DIAGNOSIS — F03918 Unspecified dementia, unspecified severity, with other behavioral disturbance: Secondary | ICD-10-CM

## 2017-09-12 LAB — CBC
HEMATOCRIT: 41.8 % (ref 40.0–52.0)
HEMOGLOBIN: 13.5 g/dL (ref 13.0–18.0)
MCH: 26.8 pg (ref 26.0–34.0)
MCHC: 32.3 g/dL (ref 32.0–36.0)
MCV: 83 fL (ref 80.0–100.0)
Platelets: 235 10*3/uL (ref 150–440)
RBC: 5.03 MIL/uL (ref 4.40–5.90)
RDW: 18.9 % — ABNORMAL HIGH (ref 11.5–14.5)
WBC: 8.8 10*3/uL (ref 3.8–10.6)

## 2017-09-12 LAB — COMPREHENSIVE METABOLIC PANEL
ALBUMIN: 3.7 g/dL (ref 3.5–5.0)
ALT: 17 U/L (ref 17–63)
ANION GAP: 10 (ref 5–15)
AST: 25 U/L (ref 15–41)
Alkaline Phosphatase: 52 U/L (ref 38–126)
BUN: 11 mg/dL (ref 6–20)
CALCIUM: 9.1 mg/dL (ref 8.9–10.3)
CHLORIDE: 100 mmol/L — AB (ref 101–111)
CO2: 26 mmol/L (ref 22–32)
CREATININE: 0.97 mg/dL (ref 0.61–1.24)
GFR calc non Af Amer: >60 mL/min (ref >60)
GLUCOSE: 214 mg/dL — AB (ref 65–99)
POTASSIUM: 3.5 mmol/L (ref 3.5–5.1)
SODIUM: 136 mmol/L (ref 135–145)
Total Bilirubin: 0.5 mg/dL (ref 0.3–1.2)
Total Protein: 7.6 g/dL (ref 6.5–8.1)

## 2017-09-12 LAB — SALICYLATE LEVEL: Salicylate Lvl: <7 mg/dL (ref 2.8–30.0)

## 2017-09-12 LAB — ETHANOL: Alcohol, Ethyl (B): <10 mg/dL (ref <10)

## 2017-09-12 LAB — ACETAMINOPHEN LEVEL: Acetaminophen (Tylenol), Serum: <10 ug/mL — ABNORMAL LOW (ref 10–30)

## 2017-09-12 MED ORDER — OLANZAPINE 5 MG PO TBDP
5.0000 mg | ORAL_TABLET | Freq: Every day | ORAL | Status: DC
  Administered 2017-09-12 – 2017-09-14 (×3): 5 mg via ORAL
  Filled 2017-09-12 (×3): qty 1
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
## 2017-09-12 NOTE — ED Notes (Signed)
Pt is from [facility].
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
## 2017-09-12 NOTE — ED Provider Notes (Signed)
Clinical Course as of Sep 12 2320  Mon Sep 12, 2017  2319 No acute injuries per radiology DG Hand Complete Right [CF]  2320 No acute injuries, only chronic osteoarthritis DG Hand Complete Left [CF]    Clinical Course User Index [CF] Dennis Sandoval, Maghan Jessee, MD      Dennis Sandoval, Kuba Shepherd, MD 09/13/17 0730

## 2017-09-13 DIAGNOSIS — F0281 Dementia in other diseases classified elsewhere with behavioral disturbance: Secondary | ICD-10-CM | POA: Diagnosis not present

## 2017-09-13 DIAGNOSIS — G301 Alzheimer's disease with late onset: Secondary | ICD-10-CM | POA: Diagnosis not present

## 2017-09-13 DIAGNOSIS — F0391 Unspecified dementia with behavioral disturbance: Secondary | ICD-10-CM

## 2017-09-13 DIAGNOSIS — F03918 Unspecified dementia, unspecified severity, with other behavioral disturbance: Secondary | ICD-10-CM

## 2017-09-13 LAB — URINE DRUG SCREEN, QUALITATIVE (ARMC ONLY)
Amphetamines, Ur Screen: NOT DETECTED
BARBITURATES, UR SCREEN: NOT DETECTED
Benzodiazepine, Ur Scrn: NOT DETECTED
COCAINE METABOLITE, UR ~~LOC~~: NOT DETECTED
Cannabinoid 50 Ng, Ur ~~LOC~~: NOT DETECTED
MDMA (ECSTASY) UR SCREEN: NOT DETECTED
METHADONE SCREEN, URINE: NOT DETECTED
OPIATE, UR SCREEN: NOT DETECTED
Phencyclidine (PCP) Ur S: NOT DETECTED
TRICYCLIC, UR SCREEN: NOT DETECTED

## 2017-09-13 MED ORDER — DONEPEZIL HCL 5 MG PO TABS
10.0000 mg | ORAL_TABLET | Freq: Every day | ORAL | Status: DC
  Administered 2017-09-13 – 2017-09-14 (×2): 10 mg via ORAL
  Filled 2017-09-13 (×2): qty 2

## 2017-09-13 MED ORDER — TRAZODONE HCL 100 MG PO TABS
100.0000 mg | ORAL_TABLET | Freq: Every day | ORAL | Status: DC
  Administered 2017-09-13: 100 mg via ORAL
  Filled 2017-09-13: qty 1

## 2017-09-13 MED ORDER — ESCITALOPRAM OXALATE 10 MG PO TABS
10.0000 mg | ORAL_TABLET | Freq: Every day | ORAL | Status: DC
  Administered 2017-09-13 – 2017-09-15 (×3): 10 mg via ORAL
  Filled 2017-09-13 (×3): qty 1

## 2017-09-13 MED ORDER — DIVALPROEX SODIUM 125 MG PO CSDR
125.0000 mg | DELAYED_RELEASE_CAPSULE | Freq: Two times a day (BID) | ORAL | Status: DC
  Administered 2017-09-13 – 2017-09-15 (×4): 125 mg via ORAL
  Filled 2017-09-13 (×6): qty 1

## 2017-09-13 MED ORDER — MEMANTINE HCL 5 MG PO TABS
5.0000 mg | ORAL_TABLET | Freq: Two times a day (BID) | ORAL | Status: DC
  Administered 2017-09-13 – 2017-09-15 (×4): 5 mg via ORAL
  Filled 2017-09-13 (×4): qty 1

## 2017-09-13 NOTE — ED Notes (Signed)
Pt given breakfast tray
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
## 2017-09-13 NOTE — ED Notes (Signed)
Spoke to pt son and updated him on situation of placement and discharge

## 2017-09-13 NOTE — ED Notes (Signed)
Dr Daisy LazarKlapac stated that pt was ready to be discharged at anytime back to [facility] - contacted [facility] to let them know that pt was ready for discharge - was advised that they would need to contact their supervisor to see if this pt was allowed back at the facility and that they would return call to me

## 2017-09-13 NOTE — ED Notes (Signed)
Breakfast tray placed in pt room. Pt sleeping 
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
## 2017-09-13 NOTE — ED Notes (Signed)
Pt family visited with pt and then son Dennis Sandoval requested that he be notified when a decision was made about where his father would be moved to when discharged from the ED - Dennis Sandoval number is [phone]

## 2017-09-13 NOTE — Clinical Social Work Note (Signed)
CSW received a call from RN Rosey Batheresa stating family of pt [facility]. RN Rosey Batheresa asked that CSW give a [facility], as she left voicemail requesting a call back.   CSW spoke with Wyn ForsterMadison [phone]) who stated family is wanting pt to [facility], as [facility]. Madison states she would need to [facility] an [facility] before pt discharges and asked to speak with MD. Wyn ForsterMadison to call ED Secretary. CSW remains available for discharge needs.   Corlis HoveJeneya Dalyce Renne, Theresia MajorsLCSWA, [facility] Clinical Social Worker-ED [phone]

## 2017-09-13 NOTE — ED Notes (Signed)
[facility] [phone] - the pt family had contacted them about the pt being discharged to their facility and she needed to do an eval prior to placement - adv her that I would contact social worker and have her to return the call - spoke with Dyke MaesJana social worker [phone] - she stated that I would need to speak with Dr Daisy LazarKlapac about when he would be discharged and then call [facility] and see if they would accept him back

## 2017-09-13 NOTE — ED Notes (Signed)
IVC  RESCINDED PER  DR  CLAPACS  PT  NOW  VOL  PENDING  PLACEMENT

## 2017-09-13 NOTE — ED Notes (Signed)
Pt bed changed by this RN and Rosanne SackKasey, Charity fundraiserN. Pt placed in gown

## 2017-09-14 MED ORDER — TRAZODONE HCL 100 MG PO TABS
100.0000 mg | ORAL_TABLET | Freq: Once | ORAL | Status: DC
  Filled 2017-09-14: qty 1

## 2017-09-14 MED ORDER — ZIPRASIDONE MESYLATE 20 MG IM SOLR: 10.0000 mg | Freq: Once | INTRAMUSCULAR | Status: DC

## 2017-09-14 MED ORDER — LORAZEPAM 2 MG/ML IJ SOLN: 1.0000 mg | Freq: Once | INTRAMUSCULAR | Status: DC

## 2017-09-14 NOTE — ED Notes (Signed)
Pt sitting on edge of bed dozing off. Pt encouraged by tech to lie in bed and pt agreed. Pt comfortably positioned in bed and covered with two blankets. Pt was shivering and this tech got pt another warm blanket. Pt said thank you and that was good. Bright light cut off and low light on and pt is now sleeping.

## 2017-09-14 NOTE — Consult Note (Signed)
[facility] Consult   Reason for Consult: Follow-up consult 76 year old man with dementia and behavior problems Referring Physician: Jimmye Norman Patient Identification: Nickalus Thornsberry MRN:  [id number] Principal Diagnosis: Dementia with behavioral disturbance Diagnosis:   Patient Active Problem List   Diagnosis Date Noted  . Dementia with behavioral disturbance [F03.91] 09/13/2017  . Hyperreflexia [R29.2] 12/21/2016  . History of BPH [Z87.438] 12/21/2016  . Medication monitoring encounter [Z51.81] 01/28/2016  . Hyperglycemia [R73.9] 01/28/2016  . Preventative health care [Z00.00] 12/21/2015  . Need for pneumococcal vaccination [Z23] 12/21/2015  . Screening for AAA (abdominal aortic aneurysm) [Z13.6] 12/01/2015  . Smoking history [Z87.891] 11/28/2015  . Dementia [F03.90] 07/25/2015  . Benign hypertension [I10] 07/25/2015    Total Time spent with patient: 30 minutes  Subjective:   St Zaine Elsass is a 76 y.o. male patient admitted with the patient has no new complaint today.  Admitted to the emergency room initially because of behavior problems at his most recent living facility.  HPI: Patient has a history of established dementia and had been at an assisted living facility but was showing some aggressive behavior.  Since being here in the emergency room he has been calm and appropriate without any agitation or aggressive behavior.  Past Psychiatric History: Nothing significant other than the dementia  Risk to Self: Suicidal Ideation: No Suicidal Intent: No Is patient at risk for suicide?: No Suicidal Plan?: No Access to Means: No What has been your use of drugs/alcohol within the last 12 months?: Denied How many times?: 0 Other Self Harm Risks: denied Triggers for Past Attempts: None known Intentional Self Injurious Behavior: None Risk to Others: Homicidal Ideation: No Thoughts of Harm to Others: No Current Homicidal Intent: No Current Homicidal Plan:  No Access to Homicidal Means: No Identified Victim: None identified History of harm to others?: Yes Assessment of Violence: On admission Violent Behavior Description: Individual diagnosed with dementia and has sundowner symptoms Does patient have access to weapons?: No Criminal Charges Pending?: No Does patient have a court date: No Prior Inpatient Therapy: Prior Inpatient Therapy: Yes Prior Therapy Dates: 02/2017 Prior Therapy Facilty/Provider(s): [facility] Reason for Treatment: Dementia Prior Outpatient Therapy: Prior Outpatient Therapy: No Prior Therapy Dates: n/a Prior Therapy Facilty/Provider(s): n/a Reason for Treatment: n/a Does patient have an ACCT team?: No Does patient have Intensive In-House Services?  : No Does patient have [facility] services? : No Does patient have P4CC services?: No  Past Medical History:  Past Medical History:  Diagnosis Date  . Alzheimer disease 06/16/2015  . Dementia   . Hypertension     Past Surgical History:  Procedure Laterality Date  . PROSTATE SURGERY     Family History:  Family History  Problem Relation Age of Onset  . Dementia Mother   . Dementia Father   . Cancer Brother        not sure what type  . Alcohol abuse Brother   . Heart disease Neg Hx   . Diabetes Neg Hx   . Stroke Neg Hx    Family Psychiatric  History: None known Social History:  History  Alcohol Use No     History  Drug Use No    Social History   Social History  . Marital status: Married    Spouse name: N/A  . Number of children: N/A  . Years of education: N/A   Social History Main Topics  . Smoking status: Former Smoker    Packs/day: 0.50    Years: 50.00  Types: Cigarettes    Quit date: 11/27/1985  . Smokeless tobacco: Never Used  . Alcohol use No  . Drug use: No  . Sexual activity: No   Other Topics Concern  . Not on file   Social History Narrative  . No narrative on file   Additional Social History:    Allergies:  No Known  Allergies  Labs:  Results for orders placed or performed during the hospital encounter of 09/12/17 (from the past 48 hour(s))  Comprehensive metabolic panel     Status: Abnormal   Collection Time: 09/12/17 10:02 PM  Result Value Ref Range   Sodium 136 135 - 145 mmol/L   Potassium 3.5 3.5 - 5.1 mmol/L   Chloride 100 (L) 101 - 111 mmol/L   CO2 26 22 - 32 mmol/L   Glucose, Bld 214 (H) 65 - 99 mg/dL   BUN 11 6 - 20 mg/dL   Creatinine, Ser 0.97 0.61 - 1.24 mg/dL   Calcium 9.1 8.9 - 10.3 mg/dL   Total Protein 7.6 6.5 - 8.1 g/dL   Albumin 3.7 3.5 - 5.0 g/dL   AST 25 15 - 41 U/L   ALT 17 17 - 63 U/L   Alkaline Phosphatase 52 38 - 126 U/L   Total Bilirubin 0.5 0.3 - 1.2 mg/dL   GFR calc non Af Amer >60 >60 mL/min   GFR calc Af Amer >60 >60 mL/min    Comment: (NOTE) The eGFR has been calculated using the CKD EPI equation. This calculation has not been validated in all clinical situations. eGFR's persistently <60 mL/min signify possible Chronic Kidney Disease.    Anion gap 10 5 - 15  Ethanol     Status: None   Collection Time: 09/12/17 10:02 PM  Result Value Ref Range   Alcohol, Ethyl (B) <10 <10 mg/dL    Comment:        LOWEST DETECTABLE LIMIT FOR SERUM ALCOHOL IS 10 mg/dL FOR MEDICAL PURPOSES ONLY   Salicylate level     Status: None   Collection Time: 09/12/17 10:02 PM  Result Value Ref Range   Salicylate Lvl <0.0 2.8 - 30.0 mg/dL  Acetaminophen level     Status: Abnormal   Collection Time: 09/12/17 10:02 PM  Result Value Ref Range   Acetaminophen (Tylenol), Serum <10 (L) 10 - 30 ug/mL    Comment:        THERAPEUTIC CONCENTRATIONS VARY SIGNIFICANTLY. A RANGE OF 10-30 ug/mL MAY BE AN EFFECTIVE CONCENTRATION FOR MANY PATIENTS. HOWEVER, SOME ARE BEST TREATED AT CONCENTRATIONS OUTSIDE THIS RANGE. ACETAMINOPHEN CONCENTRATIONS >150 ug/mL AT 4 HOURS AFTER INGESTION AND >50 ug/mL AT 12 HOURS AFTER INGESTION ARE OFTEN ASSOCIATED WITH TOXIC REACTIONS.   cbc     Status:  Abnormal   Collection Time: 09/12/17 10:02 PM  Result Value Ref Range   WBC 8.8 3.8 - 10.6 K/uL   RBC 5.03 4.40 - 5.90 MIL/uL   Hemoglobin 13.5 13.0 - 18.0 g/dL   HCT 41.8 40.0 - 52.0 %   MCV 83.0 80.0 - 100.0 fL   MCH 26.8 26.0 - 34.0 pg   MCHC 32.3 32.0 - 36.0 g/dL   RDW 18.9 (H) 11.5 - 14.5 %   Platelets 235 150 - 440 K/uL  Urine Drug Screen, Qualitative     Status: None   Collection Time: 09/13/17  3:53 AM  Result Value Ref Range   Tricyclic, Ur Screen NONE DETECTED NONE DETECTED   Amphetamines, Ur Screen NONE DETECTED NONE DETECTED  MDMA (Ecstasy)Ur Screen NONE DETECTED NONE DETECTED   Cocaine Metabolite,Ur [place] NONE DETECTED NONE DETECTED   Opiate, Ur Screen NONE DETECTED NONE DETECTED   Phencyclidine (PCP) Ur S NONE DETECTED NONE DETECTED   Cannabinoid 50 Ng, Ur [place] NONE DETECTED NONE DETECTED   Barbiturates, Ur Screen NONE DETECTED NONE DETECTED   Benzodiazepine, Ur Scrn NONE DETECTED NONE DETECTED   Methadone Scn, Ur NONE DETECTED NONE DETECTED    Comment: (NOTE) 625  Tricyclics, urine               Cutoff 1000 ng/mL 200  Amphetamines, urine             Cutoff 1000 ng/mL 300  MDMA (Ecstasy), urine           Cutoff 500 ng/mL 400  Cocaine Metabolite, urine       Cutoff 300 ng/mL 500  Opiate, urine                   Cutoff 300 ng/mL 600  Phencyclidine (PCP), urine      Cutoff 25 ng/mL 700  Cannabinoid, urine              Cutoff 50 ng/mL 800  Barbiturates, urine             Cutoff 200 ng/mL 900  Benzodiazepine, urine           Cutoff 200 ng/mL 1000 Methadone, urine                Cutoff 300 ng/mL 1100 1200 The urine drug screen provides only a preliminary, unconfirmed 1300 analytical test result and should not be used for non-medical 1400 purposes. Clinical consideration and professional judgment should 1500 be applied to any positive drug screen result due to possible 1600 interfering substances. A more specific alternate chemical method 1700 must be used in order to  obtain a confirmed analytical result.  1800 Gas chromato graphy / mass spectrometry (GC/MS) is the preferred 1900 confirmatory method.     Current Facility-Administered Medications  Medication Dose Route Frequency Provider Last Rate Last Dose  . divalproex (DEPAKOTE SPRINKLE) capsule 125 mg  125 mg Oral Q12H Clapacs, Madie Reno, MD   125 mg at 09/14/17 6389  . donepezil (ARICEPT) tablet 10 mg  10 mg Oral QHS Clapacs, Madie Reno, MD   10 mg at 09/13/17 2203  . escitalopram (LEXAPRO) tablet 10 mg  10 mg Oral Daily Clapacs, Madie Reno, MD   10 mg at 09/14/17 3734  . memantine (NAMENDA) tablet 5 mg  5 mg Oral BID Clapacs, Madie Reno, MD   5 mg at 09/14/17 2876  . OLANZapine zydis (ZYPREXA) disintegrating tablet 5 mg  5 mg Oral QHS Darel Hong, MD   5 mg at 09/13/17 2203  . traZODone (DESYREL) tablet 100 mg  100 mg Oral QHS Clapacs, Madie Reno, MD   100 mg at 09/13/17 2203   Current Outpatient Prescriptions  Medication Sig Dispense Refill  . aspirin 81 MG chewable tablet Chew 81 mg by mouth daily.    . cholecalciferol (VITAMIN D) 1000 units tablet Take 1,000 Units by mouth daily.    . divalproex (DEPAKOTE SPRINKLE) 125 MG capsule Take 125 mg by mouth 2 (two) times daily.    . divalproex (DEPAKOTE SPRINKLE) 125 MG capsule Take 500 mg by mouth at bedtime.    . donepezil (ARICEPT) 10 MG tablet Take 1 tablet (10 mg total) by mouth at bedtime. 30 tablet 11  .  escitalopram (LEXAPRO) 10 MG tablet Take 0.5 tablets (5 mg total) by mouth daily. (for mood) 30 tablet 0  . lisinopril (PRINIVIL,ZESTRIL) 5 MG tablet Take 5 mg by mouth daily.    Marland Kitchen loratadine (CLARITIN) 10 MG tablet Take 10 mg by mouth daily.    Marland Kitchen LORazepam (ATIVAN) 0.5 MG tablet Take 0.5 mg by mouth every 8 (eight) hours.    . memantine (NAMENDA) 5 MG tablet Take 1 tablet (5 mg total) by mouth 2 (two) times daily. 60 tablet 5  . Multiple Vitamin (MULTIVITAMIN WITH MINERALS) TABS tablet Take 1 tablet by mouth daily.    . traZODone (DESYREL) 100 MG tablet  Take 100 mg by mouth at bedtime.      Musculoskeletal: Strength & Muscle Tone: within normal limits Gait & Station: normal Patient leans: N/A  Psychiatric Specialty Exam: Physical Exam  Nursing note and vitals reviewed. Constitutional: He appears well-developed and well-nourished.  HENT:  Head: Normocephalic and atraumatic.  Eyes: Pupils are equal, round, and reactive to light. Conjunctivae are normal.  Neck: Normal range of motion.  Cardiovascular: Regular rhythm and normal heart sounds.   Respiratory: Effort normal. No respiratory distress.  GI: Soft.  Musculoskeletal: Normal range of motion.  Neurological: He is alert.  Skin: Skin is warm and dry.  Psychiatric: His affect is blunt. His speech is delayed. He is slowed and withdrawn. Cognition and memory are impaired. He expresses impulsivity. He expresses no homicidal and no suicidal ideation. He exhibits abnormal recent memory.    Review of Systems  Constitutional: Negative.   HENT: Negative.   Eyes: Negative.   Respiratory: Negative.   Cardiovascular: Negative.   Gastrointestinal: Negative.   Musculoskeletal: Negative.   Skin: Negative.   Neurological: Negative.   Psychiatric/Behavioral: Negative.     Blood pressure (!) 153/92, pulse 93, temperature 97.9 F (36.6 C), temperature source Oral, resp. rate 14, height '5\' 10"'  (1.778 m), weight 81.6 kg (180 lb), SpO2 100 %.Body mass index is 25.83 kg/m.  General Appearance: Fairly Groomed  Eye Contact:  Minimal  Speech:  Slow  Volume:  Decreased  Mood:  Euthymic  Affect:  Constricted  Thought Process:  Disorganized  Orientation:  Negative  Thought Content:  Illogical  Suicidal Thoughts:  No  Homicidal Thoughts:  No  Memory:  Immediate;   Fair Recent;   Fair Remote;   Fair  Judgement:  Impaired  Insight:  Lacking  Psychomotor Activity:  Decreased  Concentration:  Concentration: Poor  Recall:  Poor  Fund of Knowledge:  Fair  Language:  Fair  Akathisia:  No   Handed:  Right  AIMS (if indicated):     Assets:  Social Support  ADL's:  Impaired  Cognition:  Impaired,  Mild and Moderate  Sleep:        Treatment Plan Summary: Plan Patient has been calm and without any agitation or behavior problems.  Family has expressed a desire to move him to a new facility.  [facility] is tentatively planning to admit him tomorrow but does not have space for the evening.  No change to treatment plan for now.  Disposition: Patient does not meet criteria for psychiatric inpatient admission. Supportive therapy provided about ongoing stressors.  Alethia Berthold, MD 09/14/2017 4:50 PM

## 2017-09-14 NOTE — ED Provider Notes (Signed)
-----------------------------------------   8:10 AM on 09/14/2017 -----------------------------------------   Blood pressure (!) 143/80, pulse 90, temperature 97.6 F (36.4 C), temperature source Oral, resp. rate 20, height 5\' 10"  (1.778 m), weight 81.6 kg (180 lb), SpO2 96 %.  The patient had no acute events since last update.  Calm and cooperative at this time.  Disposition is pending per Psychiatry/Behavioral Medicine team recommendations.     Nita SickleVeronese, [place], MD 09/14/17 [phone]

## 2017-09-14 NOTE — NC FL2 (Signed)
[place] MEDICAID FL2 LEVEL OF CARE SCREENING TOOL     IDENTIFICATION  Patient Name: Dennis Sandoval Birthdate: [birth year] Sex: male Admission Date (Current Location): 09/12/2017  [place] and [place] Number:  Chiropodist and Address:  [facility], [address], [place], [place] [address]      Provider Number: [id number]  Attending Physician Name and Address:  No att. providers found  Relative Name and Phone Number:  Archimedes Harold [phone]    Current Level of Care: Hospital Recommended Level of Care: Assisted Living Facility Prior Approval Number:    Date Approved/Denied:   PASRR Number: [id number]  Discharge Plan: Domiciliary (Rest home)    Current Diagnoses: Patient Active Problem List   Diagnosis Date Noted  . 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 History of BPH 12/21/2016  . Medication monitoring encounter 01/28/2016  . Hyperglycemia 01/28/2016  . Preventative health care 12/21/2015  . Need for pneumococcal vaccination 12/21/2015  . Screening for AAA (abdominal aortic aneurysm) 12/01/2015  . Smoking history 11/28/2015  . Dementia 07/25/2015  . Benign hypertension 07/25/2015    Orientation RESPIRATION BLADDER Height & Weight     Self  Normal Continent Weight: 180 lb (81.6 kg) Height:  5\' 10"  (177.8 cm)  BEHAVIORAL SYMPTOMS/MOOD NEUROLOGICAL BOWEL NUTRITION STATUS      Continent Diet (Regular diet)  AMBULATORY STATUS COMMUNICATION OF NEEDS Skin   Supervision Verbally Normal                       Personal Care Assistance Level of Assistance  Bathing, Feeding, Dressing Bathing Assistance: Limited assistance Feeding assistance: Independent Dressing Assistance: Limited assistance     Functional Limitations Info  Sight, Hearing, Speech Sight Info: Adequate Hearing Info: Adequate Speech Info: Adequate    SPECIAL CARE FACTORS FREQUENCY                       Contractures Contractures Info: Not present    Additional Factors Info  Code Status, Allergies Code Status Info: Full Allergies Info: No known allergies           Current Medications (09/14/2017):  This is the current hospital active medication list Current Facility-Administered Medications  Medication Dose Route Frequency Provider Last Rate Last Dose  . divalproex (DEPAKOTE SPRINKLE) capsule 125 mg  125 mg Oral Q12H Clapacs, Jackquline Denmark, MD   125 mg at 09/14/17 8469  . donepezil (ARICEPT) tablet 10 mg  10 mg Oral QHS Clapacs, Jackquline Denmark, MD   10 mg at 09/13/17 2203  . escitalopram (LEXAPRO) tablet 10 mg  10 mg Oral Daily Clapacs, Jackquline Denmark, MD   10 mg at 09/14/17 6295  . memantine (NAMENDA) tablet 5 mg  5 mg Oral BID Clapacs, Jackquline Denmark, MD   5 mg at 09/14/17 2841  . OLANZapine zydis (ZYPREXA) disintegrating tablet 5 mg  5 mg Oral QHS Merrily Brittle, MD   5 mg at 09/13/17 2203  . traZODone (DESYREL) tablet 100 mg  100 mg Oral QHS Clapacs, Jackquline Denmark, MD   100 mg at 09/13/17 2203   Current Outpatient Prescriptions  Medication Sig Dispense Refill  . aspirin 81 MG chewable tablet Chew 81 mg by mouth daily.    . cholecalciferol (VITAMIN D) 1000 units tablet Take 1,000 Units by mouth daily.    . divalproex (DEPAKOTE SPRINKLE) 125 MG capsule Take 125 mg by mouth 2 (two) times daily.    Marland Kitchen  divalproex (DEPAKOTE SPRINKLE) 125 MG capsule Take 500 mg by mouth at bedtime.    . donepezil (ARICEPT) 10 MG tablet Take 1 tablet (10 mg total) by mouth at bedtime. 30 tablet 11  . escitalopram (LEXAPRO) 10 MG tablet Take 0.5 tablets (5 mg total) by mouth daily. (for mood) 30 tablet 0  . lisinopril (PRINIVIL,ZESTRIL) 5 MG tablet Take 5 mg by mouth daily.    Marland Kitchen. loratadine (CLARITIN) 10 MG tablet Take 10 mg by mouth daily.    Marland Kitchen. LORazepam (ATIVAN) 0.5 MG tablet Take 0.5 mg by mouth every 8 (eight) hours.    . memantine (NAMENDA) 5 MG tablet Take 1 tablet (5 mg total) by mouth 2 (two) times daily. 60 tablet 5  .  Multiple Vitamin (MULTIVITAMIN WITH MINERALS) TABS tablet Take 1 tablet by mouth daily.    . traZODone (DESYREL) 100 MG tablet Take 100 mg by mouth at bedtime.       Discharge Medications: Please see discharge summary for a list of discharge medications.  Relevant Imaging Results:  Relevant Lab Results:   Additional Information SSN: [id number]  Dominic PeaJeneya G Hersel Mcmeen, LCSW

## 2017-09-14 NOTE — Clinical Social Work Note (Addendum)
CSW received a consult for "[id number]."   CSW already working with pt and family to get placed at [facility] today. CSW left several voicemail for Mable Paris [phone]) at [facility] already.  CSW spoke with son Milam Allbaugh [phone]) and updated him about Ms. Levada Dy not returning my calls. Son stated Kristen Cardinal is coming to assess pt at 2:30pm today and son has signed paperwork already. CSW explained that if [facility] does not accept pt, pt will be going home with family as he is ready for discharge. Son stated he will "make some calls."   Mable Paris from [facility] met with pt and confirmed with CSW to accept pt Thursday at 1pm. Ms. Levada Dy also stated that she was already received confirmation from Dr. Weber Cooks that pt can stay another night until discharge tomorrow. CSW made Social Work Asst. Director Elana Alm aware. CSW confirmed fax number with Ms. Levada Dy and faxed FL-2 to [phone]. Per son Inaki Vantine, he will transport pt at discharge tomorrow. CSW continuing to follow for discharge needs.   Oretha Ellis, Latanya Presser, [place] Social Worker-ED [phone]

## 2017-09-14 NOTE — ED Notes (Signed)
Patient is voluntary and is pending placement. 

## 2017-09-14 NOTE — ED Notes (Signed)
Pt given lunch tray.

## 2017-09-15 DIAGNOSIS — E559 Vitamin D deficiency, unspecified: Secondary | ICD-10-CM | POA: Diagnosis not present

## 2017-09-15 DIAGNOSIS — I10 Essential (primary) hypertension: Secondary | ICD-10-CM | POA: Diagnosis not present

## 2017-09-15 NOTE — ED Notes (Signed)
Patient observed lying in bed with eyes closed  Even, unlabored respirations observed   NAD pt appears to be sleeping   1:1 sitter is at bedside   I will continue to monitor along with every 15 minute visual observations and ongoing security monitoring    

## 2017-09-15 NOTE — ED Notes (Signed)

## 2017-09-15 NOTE — ED Notes (Signed)
ED Is the patient under IVC or is there intent for IVC:  Voluntary  Is the patient medically cleared: Yes.   Is there vacancy in the ED BHU: Yes.   Is the population mix appropriate for patient: Yes.   Is the patient awaiting placement in inpatient or outpatient setting:  [facility] pt to transfer after lunch   Has the patient had a psychiatric consult: Yes.   Survey of unit performed for contraband, proper placement and condition of furniture, tampering with fixtures in bathroom, shower, and each patient room: Yes.  ; Findings:  APPEARANCE/BEHAVIOR Calm and cooperative NEURO ASSESSMENT Orientation: oriented to self   Denies pain Hallucinations: No.None noted (Hallucinations) Speech: Normal Gait: normal RESPIRATORY ASSESSMENT Even  Unlabored respirations  CARDIOVASCULAR ASSESSMENT Pulses equal   regular rate  Skin warm and dry   GASTROINTESTINAL ASSESSMENT no GI complaint EXTREMITIES Full ROM  PLAN OF CARE Provide calm/safe environment. Vital signs assessed twice daily. ED BHU Assessment once each 12-hour shift. Collaborate with TTS when available or as condition indicates. Assure the ED provider has rounded once each shift. Provide and encourage hygiene. Provide redirection as needed. Assess for escalating behavior; address immediately and inform ED provider.  Assess family dynamic and appropriateness for visitation as needed: Yes.  ; If necessary, describe findings:  Educate the patient/family about BHU procedures/visitation: Yes.  ; If necessary, describe findings:

## 2017-09-15 NOTE — Discharge Instructions (Signed)
Return to the emergency department for any changes in mental status, fever, inability to keep down fluids, or any other symptoms concerning to you.

## 2017-09-15 NOTE — ED Notes (Signed)
BEHAVIORAL HEALTH ROUNDING Patient sleeping: No. Patient alert and oriented: yes Behavior appropriate: Yes.  ; If no, describe:  Nutrition and fluids offered: yes Toileting and hygiene offered: Yes  Sitter present: q15 minute observations and security camera monitoring Law enforcement present: Yes  ODS  

## 2017-10-12 DIAGNOSIS — G4700 Insomnia, unspecified: Secondary | ICD-10-CM | POA: Diagnosis not present

## 2017-10-12 DIAGNOSIS — R451 Restlessness and agitation: Secondary | ICD-10-CM | POA: Diagnosis not present

## 2017-11-28 DIAGNOSIS — E559 Vitamin D deficiency, unspecified: Secondary | ICD-10-CM | POA: Diagnosis not present

## 2017-11-28 DIAGNOSIS — G4700 Insomnia, unspecified: Secondary | ICD-10-CM | POA: Diagnosis not present

## 2017-11-28 DIAGNOSIS — I10 Essential (primary) hypertension: Secondary | ICD-10-CM | POA: Diagnosis not present

## 2017-12-01 DIAGNOSIS — Z79899 Other long term (current) drug therapy: Secondary | ICD-10-CM | POA: Diagnosis not present

## 2017-12-01 DIAGNOSIS — I10 Essential (primary) hypertension: Secondary | ICD-10-CM | POA: Diagnosis not present

## 2018-01-19 DIAGNOSIS — Z79899 Other long term (current) drug therapy: Secondary | ICD-10-CM | POA: Diagnosis not present

## 2018-01-19 DIAGNOSIS — G4700 Insomnia, unspecified: Secondary | ICD-10-CM | POA: Diagnosis not present

## 2018-01-19 DIAGNOSIS — R609 Edema, unspecified: Secondary | ICD-10-CM | POA: Diagnosis not present

## 2018-01-19 DIAGNOSIS — I10 Essential (primary) hypertension: Secondary | ICD-10-CM | POA: Diagnosis not present

## 2018-01-26 DIAGNOSIS — R400 Somnolence: Secondary | ICD-10-CM | POA: Diagnosis not present

## 2018-01-26 DIAGNOSIS — I10 Essential (primary) hypertension: Secondary | ICD-10-CM | POA: Diagnosis not present

## 2018-01-26 DIAGNOSIS — Z79899 Other long term (current) drug therapy: Secondary | ICD-10-CM | POA: Diagnosis not present

## 2018-03-02 DIAGNOSIS — R609 Edema, unspecified: Secondary | ICD-10-CM | POA: Diagnosis not present

## 2018-03-02 DIAGNOSIS — R400 Somnolence: Secondary | ICD-10-CM | POA: Diagnosis not present

## 2018-03-02 DIAGNOSIS — Z79899 Other long term (current) drug therapy: Secondary | ICD-10-CM | POA: Diagnosis not present

## 2018-03-26 ENCOUNTER — Emergency Department: Payer: Medicare Other

## 2018-03-26 ENCOUNTER — Emergency Department
Admission: EM | Admit: 2018-03-26 | Discharge: 2018-03-26 | Disposition: A | Discharge status: Nursing Home (Includes ICF) | Payer: Medicare Other | Source: Home / Self Care | Attending: Emergency Medicine | Admitting: Emergency Medicine

## 2018-03-26 DIAGNOSIS — Z87891 Personal history of nicotine dependence: Secondary | ICD-10-CM

## 2018-03-26 DIAGNOSIS — I10 Essential (primary) hypertension: Secondary | ICD-10-CM | POA: Insufficient documentation

## 2018-03-26 DIAGNOSIS — D72829 Elevated white blood cell count, unspecified: Secondary | ICD-10-CM | POA: Diagnosis not present

## 2018-03-26 DIAGNOSIS — I639 Cerebral infarction, unspecified: Secondary | ICD-10-CM | POA: Diagnosis not present

## 2018-03-26 DIAGNOSIS — R220 Localized swelling, mass and lump, head: Secondary | ICD-10-CM

## 2018-03-26 DIAGNOSIS — K122 Cellulitis and abscess of mouth: Secondary | ICD-10-CM | POA: Diagnosis not present

## 2018-03-26 DIAGNOSIS — J690 Pneumonitis due to inhalation of food and vomit: Secondary | ICD-10-CM | POA: Diagnosis not present

## 2018-03-26 DIAGNOSIS — S199XXA Unspecified injury of neck, initial encounter: Secondary | ICD-10-CM | POA: Diagnosis not present

## 2018-03-26 DIAGNOSIS — A419 Sepsis, unspecified organism: Secondary | ICD-10-CM | POA: Diagnosis not present

## 2018-03-26 DIAGNOSIS — L03211 Cellulitis of face: Secondary | ICD-10-CM | POA: Diagnosis not present

## 2018-03-26 DIAGNOSIS — R000 Tachycardia, unspecified: Secondary | ICD-10-CM | POA: Diagnosis not present

## 2018-03-26 DIAGNOSIS — G309 Alzheimer's disease, unspecified: Secondary | ICD-10-CM | POA: Insufficient documentation

## 2018-03-26 DIAGNOSIS — Z79899 Other long term (current) drug therapy: Secondary | ICD-10-CM | POA: Insufficient documentation

## 2018-03-26 DIAGNOSIS — R4182 Altered mental status, unspecified: Secondary | ICD-10-CM | POA: Diagnosis not present

## 2018-03-26 DIAGNOSIS — E1165 Type 2 diabetes mellitus with hyperglycemia: Secondary | ICD-10-CM | POA: Diagnosis not present

## 2018-03-26 DIAGNOSIS — R29810 Facial weakness: Secondary | ICD-10-CM | POA: Diagnosis not present

## 2018-03-26 DIAGNOSIS — K089 Disorder of teeth and supporting structures, unspecified: Secondary | ICD-10-CM | POA: Diagnosis not present

## 2018-03-26 DIAGNOSIS — L0390 Cellulitis, unspecified: Secondary | ICD-10-CM | POA: Diagnosis not present

## 2018-03-26 DIAGNOSIS — S299XXA Unspecified injury of thorax, initial encounter: Secondary | ICD-10-CM | POA: Diagnosis not present

## 2018-03-26 DIAGNOSIS — W19XXXA Unspecified fall, initial encounter: Secondary | ICD-10-CM | POA: Diagnosis not present

## 2018-03-26 DIAGNOSIS — G9340 Encephalopathy, unspecified: Secondary | ICD-10-CM | POA: Diagnosis not present

## 2018-03-26 DIAGNOSIS — K047 Periapical abscess without sinus: Secondary | ICD-10-CM | POA: Diagnosis not present

## 2018-03-26 DIAGNOSIS — L0291 Cutaneous abscess, unspecified: Secondary | ICD-10-CM | POA: Diagnosis not present

## 2018-03-26 DIAGNOSIS — L0201 Cutaneous abscess of face: Secondary | ICD-10-CM | POA: Diagnosis not present

## 2018-03-26 DIAGNOSIS — S0990XA Unspecified injury of head, initial encounter: Secondary | ICD-10-CM | POA: Diagnosis not present

## 2018-03-26 DIAGNOSIS — J189 Pneumonia, unspecified organism: Secondary | ICD-10-CM | POA: Diagnosis not present

## 2018-03-26 DIAGNOSIS — M549 Dorsalgia, unspecified: Secondary | ICD-10-CM | POA: Diagnosis not present

## 2018-03-26 LAB — CBC
HCT: 40.6 % (ref 40.0–52.0)
Hemoglobin: 13.3 g/dL (ref 13.0–18.0)
MCH: 26.3 pg (ref 26.0–34.0)
MCHC: 32.9 g/dL (ref 32.0–36.0)
MCV: 80 fL (ref 80.0–100.0)
PLATELETS: 250 10*3/uL (ref 150–440)
RBC: 5.07 MIL/uL (ref 4.40–5.90)
RDW: 19.1 % — AB (ref 11.5–14.5)
WBC: 11 10*3/uL — AB (ref 3.8–10.6)

## 2018-03-26 LAB — PROTIME-INR
INR: 0.98
PROTHROMBIN TIME: 12.9 s (ref 11.4–15.2)

## 2018-03-26 LAB — COMPREHENSIVE METABOLIC PANEL
ALT: 11 U/L — AB (ref 17–63)
ANION GAP: 6 (ref 5–15)
AST: 18 U/L (ref 15–41)
Albumin: 3.7 g/dL (ref 3.5–5.0)
Alkaline Phosphatase: 75 U/L (ref 38–126)
BUN: 10 mg/dL (ref 6–20)
CHLORIDE: 101 mmol/L (ref 101–111)
CO2: 28 mmol/L (ref 22–32)
CREATININE: 0.96 mg/dL (ref 0.61–1.24)
Calcium: 8.9 mg/dL (ref 8.9–10.3)
Glucose, Bld: 256 mg/dL — ABNORMAL HIGH (ref 65–99)
POTASSIUM: 4.1 mmol/L (ref 3.5–5.1)
Sodium: 135 mmol/L (ref 135–145)
Total Bilirubin: 0.8 mg/dL (ref 0.3–1.2)
Total Protein: 7.8 g/dL (ref 6.5–8.1)

## 2018-03-26 LAB — APTT: APTT: 29 s (ref 24–36)

## 2018-03-26 LAB — DIFFERENTIAL
BASOS ABS: 0.1 10*3/uL (ref 0–0.1)
BASOS PCT: 1 %
Eosinophils Absolute: 0.1 10*3/uL (ref 0–0.7)
Eosinophils Relative: 1 %
LYMPHS PCT: 10 %
Lymphs Abs: 1.1 10*3/uL (ref 1.0–3.6)
Monocytes Absolute: 1 10*3/uL (ref 0.2–1.0)
Monocytes Relative: 9 %
NEUTROS PCT: 79 %
Neutro Abs: 8.7 10*3/uL — ABNORMAL HIGH (ref 1.4–6.5)

## 2018-03-26 LAB — GLUCOSE, CAPILLARY: GLUCOSE-CAPILLARY: 235 mg/dL — AB (ref 65–99)

## 2018-03-26 LAB — TROPONIN I

## 2018-03-26 NOTE — Discharge Instructions (Addendum)
Patient appears to have mild right facial swelling. Had a normal workup in the ED including a normal MRI. Please follow up with PCP

## 2018-03-26 NOTE — ED Notes (Signed)
Pt to CT at this time.

## 2018-03-26 NOTE — ED Triage Notes (Signed)
Pt brought in by [facility] from [facility].  Per EMS, facility called because they thought pt's was having a stroke.  Per facility pt's had drooping to one side of mouth and 2 other staff members state that this is patient's baseline.  VS WNL per EMS.  BS 280.  Pt is A&Ox4, in NAD.

## 2018-03-26 NOTE — ED Notes (Signed)
Pt oob and walked with min assist. No noticeable weakness bilaterally.

## 2018-03-26 NOTE — ED Notes (Signed)
Pt has hx of dementia and has difficult time or delayed time following instructions given by this RN.  NIH scale reflects this.

## 2018-03-26 NOTE — ED Provider Notes (Signed)
[facility] Emergency Department Provider Note   ____________________________________________    I have reviewed the triage vital signs and the nursing notes.   HISTORY  Chief Complaint Cerebrovascular Accident  History limited by dementia   HPI Dennis Sandoval is a 77 y.o. male with history of Alzheimer's dementia with behavioral disturbance who presents today because of a concern of a possible facial droop.  Reportedly the night nurse at his facility was concerned that he had a facial droop however the 2 nurses coming on shift felt that he looked the same as always.  Patient has no complaints   Past Medical History:  Diagnosis Date  . Alzheimer disease 06/16/2015  . Dementia   . Hypertension     Patient Active Problem List   Diagnosis Date Noted  . 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 History of BPH 12/21/2016  . Medication monitoring encounter 01/28/2016  . Hyperglycemia 01/28/2016  . Preventative health care 12/21/2015  . Need for pneumococcal vaccination 12/21/2015  . Screening for AAA (abdominal aortic aneurysm) 12/01/2015  . Smoking history 11/28/2015  . Dementia 07/25/2015  . Benign hypertension 07/25/2015    Past Surgical History:  Procedure Laterality Date  . PROSTATE SURGERY      Prior to Admission medications   Medication Sig Start Date End Date Taking? Authorizing Provider  divalproex (DEPAKOTE SPRINKLE) 125 MG capsule Take 125 mg by mouth every 12 (twelve) hours.    Yes [provider]  donepezil (ARICEPT) 10 MG tablet Take 1 tablet (10 mg total) by mouth at bedtime. Patient taking differently: Take 10 mg by mouth daily.  01/14/16  Yes Lada, Janit Bern, MD  escitalopram (LEXAPRO) 10 MG tablet Take 0.5 tablets (5 mg total) by mouth daily. (for mood) Patient taking differently: Take 10 mg by mouth daily.  01/14/16  Yes Lada, Janit Bern, MD  memantine (NAMENDA) 5 MG tablet  Take 1 tablet (5 mg total) by mouth 2 (two) times daily. 04/14/16  Yes Lada, Janit Bern, MD  OLANZapine (ZYPREXA) 2.5 MG tablet Take 2.5 mg by mouth every 12 (twelve) hours as needed (agitation).   Yes [provider]  OLANZapine zydis (ZYPREXA) 5 MG disintegrating tablet Take 5 mg by mouth at bedtime.   Yes [provider]  traZODone (DESYREL) 50 MG tablet Take 50 mg by mouth at bedtime.    Yes [provider]     Allergies Patient has no known allergies.  Family History  Problem Relation Age of Onset  . Dementia Mother   . Dementia Father   . Cancer Brother        not sure what type  . Alcohol abuse Brother   . Heart disease Neg Hx   . Diabetes Neg Hx   . Stroke Neg Hx     Social History Social History   Tobacco Use  . Smoking status: Former Smoker    Packs/day: 0.50    Years: 50.00    Pack years: 25.00    Types: Cigarettes    Last attempt to quit: 11/27/1985    Years since quitting: 32.3  . Smokeless tobacco: Never Used  Substance Use Topics  . Alcohol use: No  . Drug use: No    Review of Systems limited by dementia  Constitutional: No dizziness Eyes: No visual changes.  ENT: No neck pain Cardiovascular: Denies chest pain. Respiratory: Denies shortness of breath. Gastrointestinal: No abdominal pain.  Genitourinary: Negative for incontinence  Musculoskeletal: Negative for back pain. Skin: Negative for injury Neurological: Negative for headaches   ____________________________________________   PHYSICAL EXAM:  VITAL SIGNS: ED Triage Vitals  Enc Vitals Group     BP --      Pulse Rate 03/26/18 0650 87     Resp 03/26/18 0650 16     Temp 03/26/18 0650 98 F (36.7 C)     Temp Source 03/26/18 0650 Oral     SpO2 03/26/18 0650 97 %     Weight 03/26/18 0651 81.6 kg (180 lb)     Height 03/26/18 0651 1.93 m ( )     Head Circumference --      Peak Flow --      Pain Score 03/26/18 0650 0     Pain Loc --      Pain Edu? --       Excl. in GC? --     Constitutional: Alert. No acute distress.  Eyes: Conjunctivae are normal.  PERRLA, EOMI Head: Atraumatic. Nose: No congestion/rhinnorhea. Mouth/Throat: Mucous membranes are moist.   Neck:  Painless ROM Cardiovascular: Normal rate, regular rhythm. Grossly normal heart sounds.  Good peripheral circulation. Respiratory: Normal respiratory effort.  No retractions.  Gastrointestinal: Soft and nontender. No distention.   Musculoskeletal: No lower extremity tenderness nor edema.  Warm and well perfused. Neurologic:  Normal speech and language.  Questionable facial droop on the right, very mild if so.  It almost appears that he has mild swelling just lateral to the right lower lip.  No other cranial nerve abnormalities.  Moves upper extremities well and without difficulty.  Able to lift right leg off of the bed briefly but appears to be refusing to lift the left leg or perhaps he has weakness there.  He is able to wiggle his toes easily on both feet.  With the help of the nurse we were able to stand the patient up and he was able to walk with minimal assistance Skin:  Skin is warm, dry and intact. No rash noted. Psychiatric: Mood and affect are normal.  Patient is calm currently  ____________________________________________   LABS (all labs ordered are listed, but only abnormal results are displayed)  Labs Reviewed  CBC - Abnormal; Notable for the following components:      Result Value   WBC 11.0 (*)    RDW 19.1 (*)    All other components within normal limits  DIFFERENTIAL - Abnormal; Notable for the following components:   Neutro Abs 8.7 (*)    All other components within normal limits  COMPREHENSIVE METABOLIC PANEL - Abnormal; Notable for the following components:   Glucose, Bld 256 (*)    ALT 11 (*)    All other components within normal limits  GLUCOSE, CAPILLARY - Abnormal; Notable for the following components:   Glucose-Capillary 235 (*)    All other  components within normal limits  PROTIME-INR  APTT  TROPONIN I  CBG MONITORING, ED   ____________________________________________  EKG  ED ECG REPORT I, Jene Every, the attending physician, personally viewed and interpreted this ECG.  Date: 03/26/2018  Rhythm: normal sinus rhythm QRS Axis: normal Intervals: normal Dennis/T Wave abnormalities: normal Narrative Interpretation: no evidence of acute ischemia  ____________________________________________  RADIOLOGY  CT head negative for acute stroke ____________________________________________   PROCEDURES  Procedure(s) performed: No  Procedures   Critical Care performed:No ____________________________________________   INITIAL IMPRESSION / ASSESSMENT AND PLAN / ED COURSE  Pertinent labs & imaging results that were available  during my care of the patient were reviewed by me and considered in my medical decision making (see chart for details).  Patient presents with reports of possible facial droop.  I will contact nursing home staff to determine more, while I attempt to do that we will obtain CT head, labs as he may have some weakness in the left leg and possibly a facial droop  Reviewed past medical records and no reports of facial droop  Unknown time of onset, apparently he woke up with symptoms  ----------------------------------------- 7:38 AM on 03/26/2018 -----------------------------------------  Sons are here now, they noted what they thought was swelling to the right face immediately when they saw him 2 days ago in fact they took a picture.  They report the swelling seems to have gone down but it does appear that he may have a droop on the right side of his face.  Given that it is quite unclear whether he has a neuro deficit will obtain MRI   ----------------------------------------- 9:49 AM on 03/26/2018 -----------------------------------------  MRI negative for acute stroke.  Discussed with family,  they are relieved, appropriate for discharge at this time    ____________________________________________   FINAL CLINICAL IMPRESSION(S) / ED DIAGNOSES  Final diagnoses:  Right facial swelling        Note:  This document was prepared using Dragon voice recognition software and may include unintentional dictation errors.    Jene Every, MD 03/26/18 [phone]

## 2018-03-27 ENCOUNTER — Inpatient Hospital Stay
Admission: EM | Admit: 2018-03-27 | Discharge: 2018-03-29 | DRG: 871 | Payer: Medicare Other | Attending: Internal Medicine | Admitting: Internal Medicine

## 2018-03-27 ENCOUNTER — Other Ambulatory Visit: Payer: Self-pay

## 2018-03-27 ENCOUNTER — Emergency Department: Payer: Medicare Other

## 2018-03-27 ENCOUNTER — Encounter: Payer: Self-pay | Admitting: Emergency Medicine

## 2018-03-27 DIAGNOSIS — W19XXXA Unspecified fall, initial encounter: Secondary | ICD-10-CM | POA: Diagnosis present

## 2018-03-27 DIAGNOSIS — K089 Disorder of teeth and supporting structures, unspecified: Secondary | ICD-10-CM | POA: Diagnosis present

## 2018-03-27 DIAGNOSIS — R4182 Altered mental status, unspecified: Secondary | ICD-10-CM

## 2018-03-27 DIAGNOSIS — J189 Pneumonia, unspecified organism: Secondary | ICD-10-CM

## 2018-03-27 DIAGNOSIS — Z79899 Other long term (current) drug therapy: Secondary | ICD-10-CM

## 2018-03-27 DIAGNOSIS — G9340 Encephalopathy, unspecified: Secondary | ICD-10-CM | POA: Diagnosis present

## 2018-03-27 DIAGNOSIS — L0201 Cutaneous abscess of face: Secondary | ICD-10-CM | POA: Diagnosis present

## 2018-03-27 DIAGNOSIS — Y92129 Unspecified place in nursing home as the place of occurrence of the external cause: Secondary | ICD-10-CM | POA: Diagnosis not present

## 2018-03-27 DIAGNOSIS — L03211 Cellulitis of face: Secondary | ICD-10-CM | POA: Diagnosis present

## 2018-03-27 DIAGNOSIS — K122 Cellulitis and abscess of mouth: Secondary | ICD-10-CM | POA: Diagnosis present

## 2018-03-27 DIAGNOSIS — Z87891 Personal history of nicotine dependence: Secondary | ICD-10-CM | POA: Diagnosis not present

## 2018-03-27 DIAGNOSIS — G309 Alzheimer's disease, unspecified: Secondary | ICD-10-CM | POA: Diagnosis present

## 2018-03-27 DIAGNOSIS — A419 Sepsis, unspecified organism: Principal | ICD-10-CM | POA: Diagnosis present

## 2018-03-27 DIAGNOSIS — E1165 Type 2 diabetes mellitus with hyperglycemia: Secondary | ICD-10-CM | POA: Diagnosis present

## 2018-03-27 DIAGNOSIS — J690 Pneumonitis due to inhalation of food and vomit: Secondary | ICD-10-CM | POA: Diagnosis present

## 2018-03-27 DIAGNOSIS — K047 Periapical abscess without sinus: Secondary | ICD-10-CM | POA: Diagnosis present

## 2018-03-27 DIAGNOSIS — F0280 Dementia in other diseases classified elsewhere without behavioral disturbance: Secondary | ICD-10-CM | POA: Diagnosis present

## 2018-03-27 DIAGNOSIS — I10 Essential (primary) hypertension: Secondary | ICD-10-CM | POA: Diagnosis present

## 2018-03-27 LAB — CBC WITH DIFFERENTIAL/PLATELET
BASOS ABS: 0.1 10*3/uL (ref 0–0.1)
BASOS PCT: 1 %
EOS PCT: 1 %
Eosinophils Absolute: 0.1 10*3/uL (ref 0–0.7)
HCT: 39.7 % — ABNORMAL LOW (ref 40.0–52.0)
Hemoglobin: 13 g/dL (ref 13.0–18.0)
Lymphocytes Relative: 5 %
Lymphs Abs: 0.7 10*3/uL — ABNORMAL LOW (ref 1.0–3.6)
MCH: 26.5 pg (ref 26.0–34.0)
MCHC: 32.8 g/dL (ref 32.0–36.0)
MCV: 80.9 fL (ref 80.0–100.0)
MONO ABS: 1.2 10*3/uL — AB (ref 0.2–1.0)
Monocytes Relative: 9 %
Neutro Abs: 12 10*3/uL — ABNORMAL HIGH (ref 1.4–6.5)
Neutrophils Relative %: 84 %
PLATELETS: 249 10*3/uL (ref 150–440)
RBC: 4.91 MIL/uL (ref 4.40–5.90)
RDW: 19.7 % — AB (ref 11.5–14.5)
WBC: 14.1 10*3/uL — ABNORMAL HIGH (ref 3.8–10.6)

## 2018-03-27 LAB — COMPREHENSIVE METABOLIC PANEL
ALBUMIN: 3.7 g/dL (ref 3.5–5.0)
ALK PHOS: 71 U/L (ref 38–126)
ALT: 12 U/L — ABNORMAL LOW (ref 17–63)
AST: 26 U/L (ref 15–41)
Anion gap: 7 (ref 5–15)
BILIRUBIN TOTAL: 1.1 mg/dL (ref 0.3–1.2)
BUN: 9 mg/dL (ref 6–20)
CALCIUM: 8.7 mg/dL — AB (ref 8.9–10.3)
CO2: 27 mmol/L (ref 22–32)
CREATININE: 0.82 mg/dL (ref 0.61–1.24)
Chloride: 98 mmol/L — ABNORMAL LOW (ref 101–111)
GFR calc Af Amer: >60 mL/min (ref >60)
GFR calc non Af Amer: >60 mL/min (ref >60)
GLUCOSE: 295 mg/dL — AB (ref 65–99)
Potassium: 4.6 mmol/L (ref 3.5–5.1)
Sodium: 132 mmol/L — ABNORMAL LOW (ref 135–145)
TOTAL PROTEIN: 7.7 g/dL (ref 6.5–8.1)

## 2018-03-27 LAB — URINALYSIS, COMPLETE (UACMP) WITH MICROSCOPIC
BILIRUBIN URINE: NEGATIVE
Bacteria, UA: NONE SEEN
Glucose, UA: >=500 mg/dL — AB
Ketones, ur: 20 mg/dL — AB
LEUKOCYTES UA: NEGATIVE
Nitrite: NEGATIVE
PH: 6 (ref 5.0–8.0)
Protein, ur: NEGATIVE mg/dL
SPECIFIC GRAVITY, URINE: 1.028 (ref 1.005–1.030)

## 2018-03-27 LAB — GLUCOSE, CAPILLARY
GLUCOSE-CAPILLARY: 234 mg/dL — AB (ref 65–99)
GLUCOSE-CAPILLARY: 253 mg/dL — AB (ref 65–99)
Glucose-Capillary: 243 mg/dL — ABNORMAL HIGH (ref 65–99)
Glucose-Capillary: 252 mg/dL — ABNORMAL HIGH (ref 65–99)

## 2018-03-27 LAB — TROPONIN I: Troponin I: <0.03 ng/mL (ref <0.03)

## 2018-03-27 LAB — VALPROIC ACID LEVEL: VALPROIC ACID LVL: 24 ug/mL — AB (ref 50.0–100.0)

## 2018-03-27 MED ORDER — SODIUM CHLORIDE 0.9 % IV SOLN
3.0000 g | Freq: Four times a day (QID) | INTRAVENOUS | Status: DC
  Administered 2018-03-27 – 2018-03-29 (×7): 3 g via INTRAVENOUS
  Filled 2018-03-27 (×10): qty 3

## 2018-03-27 MED ORDER — TRAZODONE HCL 50 MG PO TABS: 50.0000 mg | ORAL_TABLET | Freq: Every day | ORAL | Status: DC

## 2018-03-27 MED ORDER — AZITHROMYCIN 500 MG PO TABS: 250.0000 mg | ORAL_TABLET | Freq: Every day | ORAL | Status: DC

## 2018-03-27 MED ORDER — SODIUM CHLORIDE 0.9 % IV SOLN
Freq: Once | INTRAVENOUS | Status: AC
  Administered 2018-03-27: 09:00:00 via INTRAVENOUS

## 2018-03-27 MED ORDER — ESCITALOPRAM OXALATE 10 MG PO TABS
10.0000 mg | ORAL_TABLET | Freq: Every day | ORAL | Status: DC
Start: 2018-03-27 — End: 2018-03-29
  Administered 2018-03-27 – 2018-03-29 (×3): 10 mg via ORAL
  Filled 2018-03-27 (×3): qty 1

## 2018-03-27 MED ORDER — OLANZAPINE 5 MG PO TABS
5.0000 mg | ORAL_TABLET | Freq: Every day | ORAL | Status: DC
  Administered 2018-03-27 – 2018-03-28 (×2): 5 mg via ORAL
  Filled 2018-03-27 (×3): qty 1

## 2018-03-27 MED ORDER — DONEPEZIL HCL 5 MG PO TABS
10.0000 mg | ORAL_TABLET | Freq: Every day | ORAL | Status: DC
  Administered 2018-03-27 – 2018-03-28 (×2): 10 mg via ORAL
  Filled 2018-03-27 (×3): qty 2

## 2018-03-27 MED ORDER — SODIUM CHLORIDE 0.9 % IV SOLN
500.0000 mg | Freq: Once | INTRAVENOUS | Status: AC
  Administered 2018-03-27: 500 mg via INTRAVENOUS
  Filled 2018-03-27: qty 500

## 2018-03-27 MED ORDER — ALBUTEROL SULFATE (2.5 MG/3ML) 0.083% IN NEBU: 2.5000 mg | INHALATION_SOLUTION | Freq: Four times a day (QID) | RESPIRATORY_TRACT | Status: DC | PRN

## 2018-03-27 MED ORDER — ENOXAPARIN SODIUM 40 MG/0.4ML ~~LOC~~ SOLN
40.0000 mg | SUBCUTANEOUS | Status: DC
  Administered 2018-03-27 – 2018-03-28 (×2): 40 mg via SUBCUTANEOUS
  Filled 2018-03-27 (×2): qty 0.4

## 2018-03-27 MED ORDER — SODIUM CHLORIDE 0.9 % IV SOLN
1.0000 g | Freq: Once | INTRAVENOUS | Status: AC
  Administered 2018-03-27: 1 g via INTRAVENOUS
  Filled 2018-03-27: qty 10

## 2018-03-27 MED ORDER — SODIUM CHLORIDE 0.9 % IV SOLN
1.0000 g | INTRAVENOUS | Status: DC
  Filled 2018-03-27: qty 10

## 2018-03-27 MED ORDER — ACETAMINOPHEN 325 MG PO TABS: 650.0000 mg | ORAL_TABLET | Freq: Four times a day (QID) | ORAL | Status: DC | PRN

## 2018-03-27 MED ORDER — ACETAMINOPHEN 650 MG RE SUPP: 650.0000 mg | Freq: Four times a day (QID) | RECTAL | Status: DC | PRN

## 2018-03-27 MED ORDER — MEMANTINE HCL 5 MG PO TABS
5.0000 mg | ORAL_TABLET | Freq: Two times a day (BID) | ORAL | Status: DC
  Administered 2018-03-27 – 2018-03-29 (×4): 5 mg via ORAL
  Filled 2018-03-27 (×4): qty 1

## 2018-03-27 MED ORDER — SENNOSIDES-DOCUSATE SODIUM 8.6-50 MG PO TABS: 1.0000 | ORAL_TABLET | Freq: Every evening | ORAL | Status: DC | PRN

## 2018-03-27 MED ORDER — OLANZAPINE 2.5 MG PO TABS
2.5000 mg | ORAL_TABLET | Freq: Two times a day (BID) | ORAL | Status: DC | PRN
Start: 2018-03-27 — End: 2018-03-29
  Filled 2018-03-27: qty 1

## 2018-03-27 MED ORDER — INSULIN ASPART 100 UNIT/ML ~~LOC~~ SOLN
0.0000 [IU] | Freq: Three times a day (TID) | SUBCUTANEOUS | Status: DC
  Administered 2018-03-27: 5 [IU] via SUBCUTANEOUS
  Administered 2018-03-27 – 2018-03-28 (×3): 3 [IU] via SUBCUTANEOUS
  Administered 2018-03-28 (×2): 2 [IU] via SUBCUTANEOUS
  Administered 2018-03-29: 3 [IU] via SUBCUTANEOUS
  Administered 2018-03-29: 09:00:00 2 [IU] via SUBCUTANEOUS
  Filled 2018-03-27 (×8): qty 1

## 2018-03-27 MED ORDER — BISACODYL 10 MG RE SUPP
10.0000 mg | Freq: Every day | RECTAL | Status: DC | PRN
  Administered 2018-03-28: 17:00:00 10 mg via RECTAL
  Filled 2018-03-27: qty 1

## 2018-03-27 MED ORDER — DIVALPROEX SODIUM 125 MG PO CSDR
125.0000 mg | DELAYED_RELEASE_CAPSULE | Freq: Two times a day (BID) | ORAL | Status: DC
  Administered 2018-03-27 – 2018-03-29 (×4): 125 mg via ORAL
  Filled 2018-03-27 (×6): qty 1

## 2018-03-27 NOTE — H&P (Signed)
[facility] - [place] at [facility]   PATIENT NAME: Dennis Sandoval    MR#:  [id number]  DATE OF BIRTH:  Jul 04, 1941  DATE OF ADMISSION:  03/27/2018  PRIMARY CARE PHYSICIAN: Kerman Passey, MD   REQUESTING/REFERRING PHYSICIAN: dr Lamont Snowball  CHIEF COMPLAINT:   Altered mental status/fall HISTORY OF PRESENT ILLNESS:  Dennis Sandoval  is a 77 y.o. male with a known history of dementia who is at [facility] facility comes to the emergency room after patient was found on the floor by caretakers. According to the son who was in the room patient is not his usual self. He normally at baseline he walks he feeds himself and has good conversations held depending on the day however for last one or two days he's been feeling weak sleeping more than usual.  Son also noticed patient has swelling over the right chin and initially they thought it was facial droop however patient has significant tenderness.  In the ER patient was found to have elevated white count will be tachycardic and chest x-ray with possible developing early pneumonia along with right facial chin swelling/abscess. Pt is being admitted for further evaluation of management.  PAST MEDICAL HISTORY:   Past Medical History:  Diagnosis Date  . Alzheimer disease 06/16/2015  . Dementia   . Diabetes mellitus without complication (HCC)   . Hypertension     PAST SURGICAL HISTOIRY:   Past Surgical History:  Procedure Laterality Date  . PROSTATE SURGERY      SOCIAL HISTORY:   Social History   Tobacco Use  . Smoking status: Former Smoker    Packs/day: 0.50    Years: 50.00    Pack years: 25.00    Types: Cigarettes    Last attempt to quit: 11/27/1985    Years since quitting: 32.3  . Smokeless tobacco: Never Used  Substance Use Topics  . Alcohol use: No    FAMILY HISTORY:   Family History  Problem Relation Age of Onset  . Dementia Mother   . Dementia Father   . Cancer Brother        not sure  what type  . Alcohol abuse Brother   . Heart disease Neg Hx   . Diabetes Neg Hx   . Stroke Neg Hx     DRUG ALLERGIES:  No Known Allergies  REVIEW OF SYSTEMS:  Review of Systems  Unable to perform ROS: Dementia     MEDICATIONS AT HOME:   Prior to Admission medications   Medication Sig Start Date End Date Taking? Authorizing Provider  divalproex (DEPAKOTE SPRINKLE) 125 MG capsule Take 125 mg by mouth every 12 (twelve) hours.    Yes [provider]  donepezil (ARICEPT) 10 MG tablet Take 1 tablet (10 mg total) by mouth at bedtime. Patient taking differently: Take 10 mg by mouth daily.  01/14/16  Yes Lada, Janit Bern, MD  escitalopram (LEXAPRO) 10 MG tablet Take 0.5 tablets (5 mg total) by mouth daily. (for mood) Patient taking differently: Take 10 mg by mouth daily.  01/14/16  Yes Lada, Janit Bern, MD  memantine (NAMENDA) 5 MG tablet Take 1 tablet (5 mg total) by mouth 2 (two) times daily. 04/14/16  Yes Lada, Janit Bern, MD  OLANZapine zydis (ZYPREXA) 5 MG disintegrating tablet Take 5 mg by mouth at bedtime.   Yes [provider]  traZODone (DESYREL) 50 MG tablet Take 50 mg by mouth at bedtime.    Yes [provider]  OLANZapine (ZYPREXA) 2.5 MG tablet Take 2.5 mg by mouth every 12 (twelve) hours as needed (agitation).    [provider]      VITAL SIGNS:  Blood pressure (!) 144/101, pulse (!) 101, temperature 98.6 F (37 C), temperature source Oral, resp. rate 17, weight 81.6 kg (180 lb), SpO2 96 %.  PHYSICAL EXAMINATION:  GENERAL:  77 y.o.-year-old patient lying in the bed with no acute distress. lethargic EYES: Pupils equal, round, reactive to light and accommodation. No scleral icterus. Extraocular muscles intact.  HEENT: Head atraumatic, normocephalic.  Right chin swellling,tender to touch. Unable to be examined oral cavity since patient on NECK:  Supple, no jugular venous distention. No thyroid enlargement, no tenderness.  LUNGS: Normal breath  sounds bilaterally, no wheezing, rales,rhonchi or crepitation. No use of accessory muscles of respiration.  CARDIOVASCULAR: S1, S2 normal. No murmurs, rubs, or gallops.  ABDOMEN: Soft, nontender, nondistended. Bowel sounds present. No organomegaly or mass.  EXTREMITIES: No pedal edema, cyanosis, or clubbing.  NEUROLOGIC: was all extremities well. Patient has dementia right now does not follow much command PSYCHIATRIC: The patient is alert , awakens on verbal command and falls back to sleep SKIN: No obvious rash, lesion, or ulcer.   LABORATORY PANEL:   CBC Recent Labs  Lab 03/27/18 0615  WBC 14.1*  HGB 13.0  HCT 39.7*  PLT 249   ------------------------------------------------------------------------------------------------------------------  Chemistries  Recent Labs  Lab 03/27/18 0615  NA 132*  K 4.6  CL 98*  CO2 27  GLUCOSE 295*  BUN 9  CREATININE 0.82  CALCIUM 8.7*  AST 26  ALT 12*  ALKPHOS 71  BILITOT 1.1   ------------------------------------------------------------------------------------------------------------------  Cardiac Enzymes Recent Labs  Lab 03/27/18 0615  TROPONINI <0.03   ------------------------------------------------------------------------------------------------------------------  RADIOLOGY:  Ct Head Wo Contrast  Result Date: 03/27/2018 CLINICAL DATA:  77 year old male post fall. Denies hitting head. History of dementia. Initial encounter. EXAM: CT HEAD WITHOUT CONTRAST CT CERVICAL SPINE WITHOUT CONTRAST TECHNIQUE: Multidetector CT imaging of the head and cervical spine was performed following the standard protocol without intravenous contrast. Multiplanar CT image reconstructions of the cervical spine were also generated. COMPARISON:  03/26/2018 MR brain and head CT. FINDINGS: CT HEAD FINDINGS Brain: No intracranial hemorrhage or CT evidence of large acute infarct. Chronic microvascular changes. Atrophy. No intracranial mass lesion noted on  this unenhanced exam. Vascular: Vascular calcifications. Skull: No skull fracture. Sinuses/Orbits: No acute orbital abnormality. Remote right medial orbital wall fracture. Visualized paranasal sinuses clear. Other: Mastoid air cells and middle ear cavities are clear. CT CERVICAL SPINE FINDINGS Alignment: Normal alignment. Skull base and vertebrae: No cervical spine fracture. Congenital nonunion posterior C1 ring. Soft tissues and spinal canal: No abnormal prevertebral soft tissue swelling. Disc levels: Cervical spondylotic changes with various degrees spinal stenosis and foraminal narrowing C3-4 through C6-7. Upper chest: No worrisome abnormality. Other: Carotid bifurcation calcification with carotid arteries deviated medially. IMPRESSION: No skull fracture or intracranial hemorrhage. No cervical spine fracture, malalignment or abnormal prevertebral soft tissue swelling. Chronic changes as noted above. Electronically Signed   By: Lacy Duverney M.D.   On: 03/27/2018 07:01   Ct Head Wo Contrast  Result Date: 03/26/2018 CLINICAL DATA:  Mouth drooping.  Assess for stroke. EXAM: CT HEAD WITHOUT CONTRAST TECHNIQUE: Contiguous axial images were obtained from the base of the skull through the vertex without intravenous contrast. COMPARISON:  None. FINDINGS: Brain: No evidence of acute infarction, hemorrhage, hydrocephalus, extra-axial collection or mass lesion/mass effect. Chronic diffuse atrophy is noted. Chronic bilateral  periventricular white matter small vessel ischemic changes noted. There is lucency in the left cerebellum consistent with small old lacunar infarct. Vascular: No hyperdense vessel or unexpected calcification. Skull: Normal. Negative for fracture or focal lesion. Sinuses/Orbits: No acute finding. Other: None. IMPRESSION: No focal acute intracranial abnormality identified. Chronic diffuse atrophy. Chronic bilateral periventricular white matter small vessel ischemic change. Electronically Signed   By:  Sherian Rein M.D.   On: 03/26/2018 07:16   Ct Cervical Spine Wo Contrast  Result Date: 03/27/2018 CLINICAL DATA:  77 year old male post fall. Denies hitting head. History of dementia. Initial encounter. EXAM: CT HEAD WITHOUT CONTRAST CT CERVICAL SPINE WITHOUT CONTRAST TECHNIQUE: Multidetector CT imaging of the head and cervical spine was performed following the standard protocol without intravenous contrast. Multiplanar CT image reconstructions of the cervical spine were also generated. COMPARISON:  03/26/2018 MR brain and head CT. FINDINGS: CT HEAD FINDINGS Brain: No intracranial hemorrhage or CT evidence of large acute infarct. Chronic microvascular changes. Atrophy. No intracranial mass lesion noted on this unenhanced exam. Vascular: Vascular calcifications. Skull: No skull fracture. Sinuses/Orbits: No acute orbital abnormality. Remote right medial orbital wall fracture. Visualized paranasal sinuses clear. Other: Mastoid air cells and middle ear cavities are clear. CT CERVICAL SPINE FINDINGS Alignment: Normal alignment. Skull base and vertebrae: No cervical spine fracture. Congenital nonunion posterior C1 ring. Soft tissues and spinal canal: No abnormal prevertebral soft tissue swelling. Disc levels: Cervical spondylotic changes with various degrees spinal stenosis and foraminal narrowing C3-4 through C6-7. Upper chest: No worrisome abnormality. Other: Carotid bifurcation calcification with carotid arteries deviated medially. IMPRESSION: No skull fracture or intracranial hemorrhage. No cervical spine fracture, malalignment or abnormal prevertebral soft tissue swelling. Chronic changes as noted above. Electronically Signed   By: Lacy Duverney M.D.   On: 03/27/2018 07:01   Mr Brain Wo Contrast  Result Date: 03/26/2018 CLINICAL DATA:  Facial droop.  Dementia.  Side not described. EXAM: MRI HEAD WITHOUT CONTRAST TECHNIQUE: Multiplanar, multiecho pulse sequences of the brain and surrounding structures were  obtained without intravenous contrast. COMPARISON:  CT same day FINDINGS: Brain: Diffusion imaging does not show any acute or subacute infarction. Brainstem is normal. There are old small vessel cerebellar infarctions. Cerebral hemispheres show age related atrophy with mild chronic small-vessel changes of the deep white matter. No cortical or large vessel territory infarction. No mass lesion, hemorrhage, hydrocephalus or extra-axial collection. Vascular: Major vessels at the base of the brain show flow. Skull and upper cervical spine: Negative Sinuses/Orbits: Clear/normal Other: None IMPRESSION: No acute finding. Old small vessel cerebellar infarctions. Age related atrophy. Mild chronic small-vessel change of the hemispheric deep white matter. Electronically Signed   By: Paulina Fusi M.D.   On: 03/26/2018 08:50   Dg Chest Port 1 View  Result Date: 03/27/2018 CLINICAL DATA:  Status post unwitnessed fall, with concern for chest injury. Initial encounter. EXAM: PORTABLE CHEST 1 VIEW COMPARISON:  None. FINDINGS: The lungs are hypoexpanded. Mild bibasilar airspace opacities may reflect atelectasis or possibly pneumonia. There is no evidence of pleural effusion or pneumothorax. The cardiomediastinal silhouette is within normal limits. No acute osseous abnormalities are seen. IMPRESSION: Lungs hypoexpanded. Mild bibasilar airspace opacities may reflect atelectasis or possibly pneumonia. No displaced rib fracture seen. Electronically Signed   By: Roanna Raider M.D.   On: 03/27/2018 06:50    EKG:    IMPRESSION AND PLAN:   Dennis Sandoval  is a 77 y.o. male with a known history of dementia who is at [facility]  facility comes to the emergency room after patient was found on the floor by caretakers. According to the son who was in the room patient is not his usual self.  1. sepsis secondary to early pneumonia questionable aspiration and right chin swelling/abscess/cellulitis -patient presented with  tachycardia altered mental status elevated white count and positive chest x-ray -IV unasyn -blood cultures -PRN pain meds -ENT consult  2. leukocytosis due to number one  3. Dementia -continue home meds  4. Diabetes with elevated sugars -patient does not take any medications for diabetes -check A1c, sliding scale insulin -will need to started on oral versus insulin  5. DVT prophylaxis subcu Lovenox  All the records are reviewed and case discussed with ED provider. Management plans discussed with the patient, family and they are in agreement.  CODE STATUS: full  TOTAL TIME TAKING CARE OF THIS PATIENT: *55* minutes.    [name] M.D on 03/27/2018 at 3:27 PM  Between 7am to 6pm - Pager - [phone]  After 6pm go to [email or web address] - password [email or web address] [facility]  [facility]  Office  [phone]  CC: Primary care physician; Kerman Passey, MD

## 2018-03-27 NOTE — ED Triage Notes (Signed)
Pt arrived via EMS from [facility] post unwitnessed fall at facility. Pt denies hitting head and denies pain. Pt has hx/o dementia. MD at bedside.

## 2018-03-27 NOTE — Plan of Care (Signed)
  Problem: Activity: Goal: Ability to tolerate increased activity will improve Outcome: Progressing   Problem: Respiratory: Goal: Ability to maintain a clear airway will improve Outcome: Progressing   Problem: Education: Goal: Knowledge of General Education information will improve Outcome: Progressing   Problem: Nutrition: Goal: Adequate nutrition will be maintained Outcome: Progressing   Problem: Elimination: Goal: Will not experience complications related to bowel motility Outcome: Progressing   Problem: Pain Managment: Goal: General experience of comfort will improve Outcome: Progressing   Problem: Safety: Goal: Ability to remain free from injury will improve Outcome: Progressing   

## 2018-03-27 NOTE — Consult Note (Signed)
Dennis Sandoval, Dennis Sandoval [id number] 1941/06/04 Dennis Finner, MD   Reason for Consult: Evaluate right cheek swelling  HPI: The patient is a 77 year old white male with a known history of dementia who is a skilled nursing facility patient.  He was brought to emergency room yesterday with some dementia worsening and what was thought to be drooping in the right side of his face.  His MRI scan was negative.  He had may be some swelling at the right cheek.  Today he was found lying on the floor at the nursing home and was brought in.  He is complaining of more pain in his right cheek area.  He was started on Unasyn for possible pneumonia.  Assessment is made of his right chin/cheek area.  Allergies: No Known Allergies  ROS: Review of systems normal other than 12 systems except per HPI.  PMH:  Past Medical History:  Diagnosis Date  . Alzheimer disease 06/16/2015  . Dementia   . Diabetes mellitus without complication (HCC)   . Hypertension     FH:  Family History  Problem Relation Age of Onset  . Dementia Mother   . Dementia Father   . Cancer Brother        not sure what type  . Alcohol abuse Brother   . Heart disease Neg Hx   . Diabetes Neg Hx   . Stroke Neg Hx     SH:  Social History   Socioeconomic History  . Marital status: Married    Spouse name: Not on file  . Number of children: Not on file  . Years of education: Not on file  . Highest education level: Not on file  Occupational History  . Not on file  Social Needs  . Financial resource strain: Not on file  . Food insecurity:    Worry: Not on file    Inability: Not on file  . Transportation needs:    Medical: Not on file    Non-medical: Not on file  Tobacco Use  . Smoking status: Former Smoker    Packs/day: 0.50    Years: 50.00    Pack years: 25.00    Types: Cigarettes    Last attempt to quit: 11/27/1985    Years since quitting: 32.3  . Smokeless tobacco: Never Used  Substance and Sexual Activity  . Alcohol use: No   . Drug use: No  . Sexual activity: Never  Lifestyle  . Physical activity:    Days per week: Not on file    Minutes per session: Not on file  . Stress: Not on file  Relationships  . Social connections:    Talks on phone: Not on file    Gets together: Not on file    Attends religious service: Not on file    Active member of club or organization: Not on file    Attends meetings of clubs or organizations: Not on file    Relationship status: Not on file  . Intimate partner violence:    Fear of current or ex partner: Not on file    Emotionally abused: Not on file    Physically abused: Not on file    Forced sexual activity: Not on file  Other Topics Concern  . Not on file  Social History Narrative  . Not on file    PSH:  Past Surgical History:  Procedure Laterality Date  . PROSTATE SURGERY      Physical  Exam: CN 2-12 grossly intact and symmetric. His  right chin just below the right oral commissure is tender and slightly swollen.  Inside his lip there is swelling of the gum but no obvious abscess.  He may have a bad tooth but he does not have any tenderness of his teeth in the area.  There are no lesions of the mucosa that I can tell inside the mouth although it is tender there and he does not let me look real closely.   I do not see any other oral lesions in his posterior pharynx.   Skin warm and dry. Nasal cavity without polyps or purulence. External nose and ears without masses or lesions. Neck supple with no masses or lesions. No lymphadenopathy palpated. Thyroid normal with no masses.   A/P: He has swelling of his right cheek and gum that appears to be acute infectious in nature, possibly from an infected tooth.  He was started on Unasyn IV for pneumonia and this should cover any oral lesions here as well.  He does not have anything pointing towards the outside and no evidence of an abscess that could be drained.  If he has more discomfort in the area he can consider warm  compresses over his cheek but at this point the Unasyn should turn the infection around and control it well.  He may need dental evaluation once he is discharged to make sure he does not have a bad tooth that needs to be repaired.   Dennis Sandoval 03/27/2018 8:30 PM

## 2018-03-27 NOTE — ED Notes (Signed)
Report received.  Family at bedside, NAD. Pt resting with eyes closed. Unlabored.

## 2018-03-27 NOTE — ED Provider Notes (Signed)
[facility] Emergency Department Provider Note  ____________________________________________   First MD Initiated Contact with Patient 03/27/18 [id number]     (approximate)  I have reviewed the triage vital signs and the nursing notes.   HISTORY  Chief Complaint Fall  Level 5 exemption history limited by the patient's dementia  HPI Dennis Sandoval is a 77 y.o. male who comes to the emergency department by EMS after an unwitnessed fall at his nursing home.  The patient himself does not know what happened today and cannot provide any meaningful history.  According to EMS the patient was last seen a few minutes prior to the event and his caretakers found him lying on the floor.  He may have said that he had some back pain but it is unclear.  According to report the patient is currently behaving normally and not his baseline.  Past Medical History:  Diagnosis Date  . Alzheimer disease 06/16/2015  . Dementia   . Hypertension     Patient Active Problem List   Diagnosis Date Noted  . 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 History of BPH 12/21/2016  . Medication monitoring encounter 01/28/2016  . Hyperglycemia 01/28/2016  . Preventative health care 12/21/2015  . Need for pneumococcal vaccination 12/21/2015  . Screening for AAA (abdominal aortic aneurysm) 12/01/2015  . Smoking history 11/28/2015  . Dementia 07/25/2015  . Benign hypertension 07/25/2015    Past Surgical History:  Procedure Laterality Date  . PROSTATE SURGERY      Prior to Admission medications   Medication Sig Start Date End Date Taking? Authorizing Provider  divalproex (DEPAKOTE SPRINKLE) 125 MG capsule Take 125 mg by mouth every 12 (twelve) hours.     [provider]  donepezil (ARICEPT) 10 MG tablet Take 1 tablet (10 mg total) by mouth at bedtime. Patient taking differently: Take 10 mg by mouth daily.  01/14/16   Lada, Janit Bern, MD    escitalopram (LEXAPRO) 10 MG tablet Take 0.5 tablets (5 mg total) by mouth daily. (for mood) Patient taking differently: Take 10 mg by mouth daily.  01/14/16   Lada, Janit Bern, MD  memantine (NAMENDA) 5 MG tablet Take 1 tablet (5 mg total) by mouth 2 (two) times daily. 04/14/16   Lada, Janit Bern, MD  OLANZapine (ZYPREXA) 2.5 MG tablet Take 2.5 mg by mouth every 12 (twelve) hours as needed (agitation).    [provider]  OLANZapine zydis (ZYPREXA) 5 MG disintegrating tablet Take 5 mg by mouth at bedtime.    [provider]  traZODone (DESYREL) 50 MG tablet Take 50 mg by mouth at bedtime.     [provider]    Allergies Patient has no known allergies.  Family History  Problem Relation Age of Onset  . Dementia Mother   . Dementia Father   . Cancer Brother        not sure what type  . Alcohol abuse Brother   . Heart disease Neg Hx   . Diabetes Neg Hx   . Stroke Neg Hx     Social History Social History   Tobacco Use  . Smoking status: Former Smoker    Packs/day: 0.50    Years: 50.00    Pack years: 25.00    Types: Cigarettes    Last attempt to quit: 11/27/1985    Years since quitting: 32.3  . Smokeless tobacco: Never Used  Substance Use Topics  . Alcohol use: No  .  Drug use: No    Review of Systems Level 5 exemption history limited by the patient's dementia  ____________________________________________   PHYSICAL EXAM:  VITAL SIGNS: ED Triage Vitals  Enc Vitals Group     BP      Pulse      Resp      Temp      Temp src      SpO2      Weight      Height      Head Circumference      Peak Flow      Pain Score      Pain Loc      Pain Edu?      Excl. in GC?     Constitutional: Alert and oriented x1 to name only.  No acute distress but appears clearly confused Eyes: PERRL EOMI. pupils 2 mm to 1 mm bilaterally Head: Atraumatic. Nose: No congestion/rhinnorhea. Mouth/Throat: No trismus Neck: No stridor.  No midline tenderness or  step-offs Cardiovascular: Normal rate, regular rhythm. Grossly normal heart sounds.  Good peripheral circulation. Respiratory: Increased respiratory effort with crackles bilaterally Gastrointestinal: Soft nontender Musculoskeletal: No lower extremity edema   Neurologic: Moves all 4 Skin:  Skin is warm, dry and intact. No rash noted. Psychiatric: Profound dementia    ____________________________________________   DIFFERENTIAL includes but not limited to  Mechanical fall, vasovagal syncope, cardiogenic syncope, dehydration, Depakote overdose ____________________________________________   LABS (all labs ordered are listed, but only abnormal results are displayed)  Labs Reviewed  COMPREHENSIVE METABOLIC PANEL - Abnormal; Notable for the following components:      Result Value   Sodium 132 (*)    Chloride 98 (*)    Glucose, Bld 295 (*)    Calcium 8.7 (*)    ALT 12 (*)    All other components within normal limits  CBC WITH DIFFERENTIAL/PLATELET - Abnormal; Notable for the following components:   WBC 14.1 (*)    HCT 39.7 (*)    RDW 19.7 (*)    Neutro Abs 12.0 (*)    Lymphs Abs 0.7 (*)    Monocytes Absolute 1.2 (*)    All other components within normal limits  VALPROIC ACID LEVEL - Abnormal; Notable for the following components:   Valproic Acid Lvl 24 (*)    All other components within normal limits  TROPONIN I  URINALYSIS, COMPLETE (UACMP) WITH MICROSCOPIC    Lab work reviewed by me with elevated white count which is nonspecific but could be secondary to infection __________________________________________  EKG  ED ECG REPORT I, Merrily Brittle, the attending physician, personally viewed and interpreted this ECG.  Date: 03/27/2018 EKG Time:  Rate: 100 Rhythm: normal sinus rhythm QRS Axis: normal Intervals: normal Dennis/T Wave abnormalities: normal Narrative Interpretation: no evidence of acute  ischemia  ____________________________________________  RADIOLOGY  Head CT and cervical spine CTs reviewed by me with no acute disease Chest x-ray reviewed by me with possible pneumonia ____________________________________________   PROCEDURES  Procedure(s) performed: no  Procedures  Critical Care performed: o  Observation: no ____________________________________________   INITIAL IMPRESSION / ASSESSMENT AND PLAN / ED COURSE  Pertinent labs & imaging results that were available during my care of the patient were reviewed by me and considered in my medical decision making (see chart for details).  Patient arrives profoundly confused although this likely is at his baseline.  Differential is broad and head and cervical spine CTs are pending along with broad labs, urinalysis, and chest x-ray.  -----------------------------------------  7:26 AM on 03/27/2018 -----------------------------------------  The patient's 2 sons are now at bedside and able to provide further collateral history.  Said the patient has been increasingly weak with increasing falls recently and has had a "wet sounding cough".  This along with a chest x-ray is concerning for pneumonia as the etiology of his symptoms.  They said his mental status is clearly declined over the past week or so.  At this point the patient requires inpatient admission for IV antibiotics and further treatment of his altered mental status.  I discussed with the hospitalist who has graciously agreed to admit the patient to his service.      ____________________________________________   FINAL CLINICAL IMPRESSION(S) / ED DIAGNOSES  Final diagnoses:  Altered mental status, unspecified altered mental status type  Community acquired pneumonia, unspecified laterality      NEW MEDICATIONS STARTED DURING THIS VISIT:  New Prescriptions   No medications on file     Note:  This document was prepared using Dragon voice recognition  software and may include unintentional dictation errors.     Merrily Brittle, MD 03/27/18 [phone]

## 2018-03-27 NOTE — Progress Notes (Signed)
Family Meeting Note  Advance Directive:no  Today a meeting took place with the son   Patient  is unable to participate due [id number] capacity  From dementia  The following were discussed:Patient's diagnosis: , it is being admitted with altered mental status/encephalopathy/sepsis secondary to pneumonia and right chin abscess/cellulitis  patient's progosis: overall is stable.     Time spent during discussion:15 mins  [name], MD

## 2018-03-27 NOTE — NC FL2 (Addendum)
[place] MEDICAID FL2 LEVEL OF CARE SCREENING TOOL     IDENTIFICATION  Patient Name: Dennis Sandoval Birthdate: 12-Aug-1941 Sex: male Admission Date (Current Location): 03/27/2018  [facility] [place] Number:  Chiropodist and Address:  [facility], [address] [place], [place] [address]      Provider Number: [id number]  Attending Physician Name and Address:  [name], MD  Relative Name and Phone Number:       Current Level of Care: Hospital Recommended Level of Care: Assisted Living Facility, Memory Care Prior Approval Number:    Date Approved/Denied:   PASRR Number:    Discharge Plan: Domiciliary (Rest home)    Current Diagnoses: Primary Diagnosis: Dementia Patient Active Problem List   Diagnosis Date Noted  . Sepsis (HCC) 03/27/2018  . 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 History of BPH 12/21/2016  . Medication monitoring encounter 01/28/2016  . Hyperglycemia 01/28/2016  . Preventative health care 12/21/2015  . Need for pneumococcal vaccination 12/21/2015  . Screening for AAA (abdominal aortic aneurysm) 12/01/2015  . Smoking history 11/28/2015  . Dementia 07/25/2015  . Benign hypertension 07/25/2015    Orientation RESPIRATION BLADDER Height & Weight     Self  Normal Incontinent Weight: 180 lb (81.6 kg) Height:     BEHAVIORAL SYMPTOMS/MOOD NEUROLOGICAL BOWEL NUTRITION STATUS  (none) (None ) Continent Diet(Carb Modified )  AMBULATORY STATUS COMMUNICATION OF NEEDS Skin   Supervision Verbally Normal                       Personal Care Assistance Level of Assistance  Bathing, Feeding, Dressing Bathing Assistance: Limited assistance Feeding assistance: Independent Dressing Assistance: Limited assistance     Functional Limitations Info  Sight, Hearing, Speech Sight Info: Adequate Hearing Info: Adequate Speech Info: Adequate    SPECIAL CARE FACTORS FREQUENCY  PT  (By licensed PT), OT (By licensed OT)                    Contractures Contractures Info: Not present    Additional Factors Info  Code Status, Allergies Code Status Info: Full Code  Allergies Info: NKA           Discharge Medications: Medication List    TAKE these medications   amoxicillin-clavulanate 875-125 MG tablet Commonly known as:  AUGMENTIN Take 1 tablet by mouth every 12 (twelve) hours.   divalproex 125 MG capsule Commonly known as:  DEPAKOTE SPRINKLE Take 125 mg by mouth every 12 (twelve) hours.   donepezil 10 MG tablet Commonly known as:  ARICEPT Take 1 tablet (10 mg total) by mouth at bedtime. What changed:  when to take this   escitalopram 10 MG tablet Commonly known as:  LEXAPRO Take 0.5 tablets (5 mg total) by mouth daily. (for mood) What changed:    how much to take  additional instructions   memantine 5 MG tablet Commonly known as:  NAMENDA Take 1 tablet (5 mg total) by mouth 2 (two) times daily.   OLANZapine zydis 5 MG disintegrating tablet Commonly known as:  ZYPREXA Take 5 mg by mouth at bedtime.   OLANZapine 2.5 MG tablet Commonly known as:  ZYPREXA Take 2.5 mg by mouth every 12 (twelve) hours as needed (agitation).   traZODone 50 MG tablet Commonly known as:  DESYREL Take 1 tablet (50 mg total) by mouth at bedtime as needed for sleep. What changed:    when to take  this  reasons to take this     Relevant Imaging Results:  Relevant Lab Results:   Additional Information    Mansi Tokar  Rinaldo Ratel, [address]

## 2018-03-27 NOTE — Clinical Social Work Note (Signed)
Clinical Social Work Assessment  Patient Details  Name: Dennis Sandoval MRN: [id number] Date of Birth: 26-Jan-1941  Date of referral:  03/27/18               Reason for consult:  Facility Placement                Permission sought to share information with:  Case Manager, Magazine features editor, Family Supports Permission granted to share information::  Yes, Verbal Permission Granted  Name::        Agency::     Relationship::     Contact Information:     Housing/Transportation Living arrangements for the past 2 months:  Assisted Living Facility(Memory care ) Source of Information:  Adult Children Patient Interpreter Needed:  None Criminal Activity/Legal Involvement Pertinent to Current Situation/Hospitalization:  No - Comment as needed Significant Relationships:  Adult Children Lives with:  Facility Resident Do you feel safe going back to the place where you live?  Yes Need for family participation in patient care:  Yes (Comment)  Care giving concerns:  Patient is a long term resident at [facility]. He is unable to be assessed due to Dementia    Social Worker assessment / plan:  CSW noted in chart review that patient is from [facility]. CSW attempted to speak with patient but he is very confused. Patient's nephew is at bedside and referred CSW to patient's sons Dennis Sandoval and Dennis Sandoval. CSW contacted patient's son Dennis Sandoval [phone]. Son states that patient has been living [facility] for about 9 months. Patient's son reports that family would like for patient to return to [facility] when able. CSW explained that discharge plan would depend on patient's physical abilities. CSW explained that current plan will be for patient to return to [facility] unless he is unable. CSW contacted Luther Parody at [facility] and she reports that patient can return when ready. CSW will follow for discharge needs.   Employment status:  Retired Glass blower/designer PT Recommendations:  Not assessed at this time Information / Referral to community resources:     Patient/Family's Response to care:  Patient has Dementia and is unable to be assessed.   Patient/Family's Understanding of and Emotional Response to Diagnosis, Current Treatment, and Prognosis: Patient's family is supportive and understanding of his condition.   Emotional Assessment Appearance:  Appears stated age Attitude/Demeanor/Rapport:  Unable to Assess Affect (typically observed):  Unable to Assess Orientation:  Oriented to Self Alcohol / Substance use:  Not Applicable Psych involvement (Current and /or in the community):     Discharge Needs  Concerns to be addressed:  Discharge Planning Concerns Readmission within the last 30 days:  No Current discharge risk:  Cognitively Impaired Barriers to Discharge:  Continued Medical Work up   [employer], LCSWA 03/27/2018, 3:56 PM

## 2018-03-27 NOTE — ED Notes (Signed)
Family concerned pts stomach appears distended compared to normal.  Will make floor RN aware for admitting MD to assess.  Family finding out when last BM was.

## 2018-03-27 NOTE — Evaluation (Signed)
Physical Therapy Evaluation Patient Details Name: Dennis Sandoval MRN: [id number] DOB: Feb 29, 1941 Today's Date: 03/27/2018   History of Present Illness  Pt is a 77 y/o M who presented to the ED after found on the floor by caretakers. Pt more lethargic than usual.  Chest x-ray with possible developing early pneumonia along with R facial chin swelling/abscess. Pt presented to the ED just the day prior with possible facial droop but MRI and CT head negative and pt returned to ALF.  Pt's PMH includes Alzheimer disease.    Clinical Impression  Pt admitted with above diagnosis. Pt currently with functional limitations due to the deficits listed below (see PT Problem List). Dennis Sandoval presents with lethargy and confusion compared to his baseline dementia.  He required mod assist for bed mobility and +2 min assist for sit<>stand and stand pivot transfers.  Not safe to attempt ambulation at this time due to fatigue follow transfers.  Given pt's current mobility status, recommending SNF at d/c.  Pt will benefit from skilled PT to increase their independence and safety with mobility to allow discharge to the venue listed below.      Follow Up Recommendations SNF    Equipment Recommendations  Other (comment)(TBD at next venue of care)    Recommendations for Other Services       Precautions / Restrictions Precautions Precautions: Fall;Other (comment) Precaution Comments: monitor pulse Restrictions Weight Bearing Restrictions: No      Mobility  Bed Mobility Overal bed mobility: Needs Assistance Bed Mobility: Supine to Sit     Supine to sit: Mod assist;HOB elevated     General bed mobility comments: Cues for sequencing which pt follows minimally.  Assist to initiate bringing BLEs to EOB and min assist to do so.  Assist to elevate trunk.    Transfers Overall transfer level: Needs assistance Equipment used: Rolling walker (2 wheeled);2 person hand held assist Transfers: Sit to/from  [employer] Sit to Stand: Min assist;+2 physical assistance;From elevated surface Stand pivot transfers: Min assist;+2 physical assistance       General transfer comment: Pt performed sit>stand from elevated bed x3.  Pt with improved upright posture and static standing endurance with each attempt.  First attempt performed with RW.  This was taken away (pt does not use at baseline) with improved stability.  Pt required +2 HHA to pivot to chair.  Pt with difficulty initiating stepping but was able to follow commands to take a step toward a target (this PT's foot on the floor).   Ambulation/Gait             General Gait Details: Not safe to attempt at this time.   Stairs            Wheelchair Mobility    Modified Rankin (Stroke Patients Only)       Balance Overall balance assessment: Needs assistance;History of Falls Sitting-balance support: Single extremity supported;Feet supported Sitting balance-Leahy Scale: Poor Sitting balance - Comments: Pt requires intermittent UE support and demonstrates R lateral lean due to slope in bed.     Standing balance support: Bilateral upper extremity supported;During functional activity Standing balance-Leahy Scale: Poor Standing balance comment: Pt relies on 2 HHA for static and dynamic activities                             Pertinent Vitals/Pain Pain Assessment: Faces Faces Pain Scale: No hurt(no signs of pain) Pain Intervention(s): Monitored during session  Home Living Family/patient expects to be discharged to:: Assisted living                 Additional Comments: From [facility]    Prior Function           Comments: Unsure of the amount of assist the pt requires at baseline.  Per nephew who did not seem confident in his answers: the pt ambulates without AD at baseline but has had an increase in frequency of falling.  Pt lives in same room as wife at [facility] and the nephew  believes the pt assists his wife at times.       Hand Dominance        Extremity/Trunk Assessment   Upper Extremity Assessment Upper Extremity Assessment: Difficult to assess due to impaired cognition(However, functionally at least 3/5 BUEs )    Lower Extremity Assessment Lower Extremity Assessment: Difficult to assess due to impaired cognition(However, functionally at least 3/5 BLEs)    Cervical / Trunk Assessment Cervical / Trunk Assessment: Kyphotic  Communication   Communication: No difficulties  Cognition Arousal/Alertness: Lethargic;Awake/alert Behavior During Therapy: WFL for tasks assessed/performed Overall Cognitive Status: History of cognitive impairments - at baseline(although nephew says AMS compared to baseline)                                 General Comments: Pt waking up upon PT arrival but does appear lethargic throughout session despite eyes open and participating in mobility      General Comments General comments (skin integrity, edema, etc.): Nephew and nurse tech present during evaluation.  Pulse 114 and SpO2 99% in sitting after stand pivot transfer, RN notified.     Exercises     Assessment/Plan    PT Assessment Patient needs continued PT services  PT Problem List Decreased strength;Decreased balance;Decreased activity tolerance;Decreased knowledge of use of DME;Decreased safety awareness       PT Treatment Interventions DME instruction;Gait training;Functional mobility training;Therapeutic activities;Therapeutic exercise;Balance training;Neuromuscular re-education;Cognitive remediation;Patient/family education;Wheelchair mobility training    PT Goals (Current goals can be found in the Care Plan section)  Acute Rehab PT Goals Patient Stated Goal: Pt unable to state PT Goal Formulation: Patient unable to participate in goal setting Time For Goal Achievement: 04/10/18 Potential to Achieve Goals: Fair    Frequency Min 2X/week    Barriers to discharge   Unsure of the amount of assist available from ALF staff    Co-evaluation               AM-PAC PT "6 Clicks" Daily Activity  Outcome Measure Difficulty turning over in bed (including adjusting bedclothes, sheets and blankets)?: Unable Difficulty moving from lying on back to sitting on the side of the bed? : Unable Difficulty sitting down on and standing up from a chair with arms (e.g., wheelchair, bedside commode, etc,.)?: Unable Help needed moving to and from a bed to chair (including a wheelchair)?: A Lot Help needed walking in hospital room?: A Lot Help needed climbing 3-5 steps with a railing? : Total 6 Click Score: 8    End of Session Equipment Utilized During Treatment: Gait belt Activity Tolerance: Patient tolerated treatment well;Patient limited by fatigue Patient left: in chair;with call bell/phone within reach;with chair alarm set;with family/visitor present Nurse Communication: Mobility status PT Visit Diagnosis: History of falling (Z91.81);Unsteadiness on feet (R26.81);Difficulty in walking, not elsewhere classified (R26.2);Muscle weakness (generalized) (M62.81)    Time:  1610-9604 PT Time Calculation (min) (ACUTE ONLY): 26 min   Charges:   PT Evaluation $PT Eval Moderate Complexity: 1 Mod PT Treatments $Therapeutic Activity: 8-22 mins   PT G Codes:        Encarnacion Chu PT, DPT 03/27/2018, 4:31 PM

## 2018-03-28 LAB — URINE CULTURE: Culture: NO GROWTH

## 2018-03-28 LAB — GLUCOSE, CAPILLARY
GLUCOSE-CAPILLARY: 209 mg/dL — AB (ref 65–99)
Glucose-Capillary: 200 mg/dL — ABNORMAL HIGH (ref 65–99)
Glucose-Capillary: 241 mg/dL — ABNORMAL HIGH (ref 65–99)
Glucose-Capillary: 175 mg/dL — ABNORMAL HIGH (ref 65–99)

## 2018-03-28 LAB — HEMOGLOBIN A1C
HEMOGLOBIN A1C: 7.2 % — AB (ref 4.8–5.6)
Mean Plasma Glucose: 160 mg/dL

## 2018-03-28 MED ORDER — METFORMIN HCL 500 MG PO TABS
500.0000 mg | ORAL_TABLET | Freq: Two times a day (BID) | ORAL | Status: DC
  Administered 2018-03-28 – 2018-03-29 (×2): 500 mg via ORAL
  Filled 2018-03-28 (×3): qty 1

## 2018-03-28 NOTE — Clinical Social Work Note (Signed)
CSW found through chart review that PT is recommending SNF for patient. CSW notified Luther Parody at [facility] of PT recommendation. Luther Parody will have a nurse come to evaluate patient today to determine if they can receive patient back at discharge.    Ruthe Mannan MSW, [address] [phone]

## 2018-03-28 NOTE — Clinical Social Work Note (Signed)
CSW received a call from [facility] today stating that patient can return to [facility] at discharge. [facility] stated that they will need PT/OT orders and they can manage his therapy there. CSW will continue to follow for discharge planning needs.   Ruthe Mannan MSW, [address]  [phone]

## 2018-03-28 NOTE — Progress Notes (Signed)
[facility] [place] at [facility]   PATIENT NAME: Dennis Sandoval    MR#:  [id number]  DATE OF BIRTH:  1941/02/26  SUBJECTIVE:  patient is more awake and alert. Ate some breakfast. No no new issues for RN. Right chin swelling looks much better  REVIEW OF SYSTEMS:   Review of Systems  Unable to perform ROS: Dementia   Tolerating Diet:yes Tolerating PT: yes, weak  DRUG ALLERGIES:  No Known Allergies  VITALS:  Blood pressure (!) 138/98, pulse 88, temperature 98.7 F (37.1 C), temperature source Oral, resp. rate 18, weight 93.4 kg (206 lb), SpO2 99 %.  PHYSICAL EXAMINATION:   Physical Exam  GENERAL:  77 y.o.-year-old patient lying in the bed with no acute distress.  EYES: Pupils equal, round, reactive to light and accommodation. No scleral icterus. Extraocular muscles intact.  HEENT: Head atraumatic, normocephalic. Poor oral hygiene. right chin swelling improving.  NECK:  Supple, no jugular venous distention. No thyroid enlargement, no tenderness.  LUNGS: Normal breath sounds bilaterally, no wheezing, rales, rhonchi. No use of accessory muscles of respiration.  CARDIOVASCULAR: S1, S2 normal. No murmurs, rubs, or gallops.  ABDOMEN: Soft, nontender, nondistended. Bowel sounds present. No organomegaly or mass.  EXTREMITIES: No cyanosis, clubbing or edema b/l.    NEUROLOGIC: Cranial nerves II through XII are intact. No focal Motor or sensory deficits b/l.   PSYCHIATRIC:  patient is alert and oriented x 3.  SKIN: No obvious rash, lesion, or ulcer.   LABORATORY PANEL:  CBC Recent Labs  Lab 03/27/18 0615  WBC 14.1*  HGB 13.0  HCT 39.7*  PLT 249    Chemistries  Recent Labs  Lab 03/27/18 0615  NA 132*  K 4.6  CL 98*  CO2 27  GLUCOSE 295*  BUN 9  CREATININE 0.82  CALCIUM 8.7*  AST 26  ALT 12*  ALKPHOS 71  BILITOT 1.1   Cardiac Enzymes Recent Labs  Lab 03/27/18 0615  TROPONINI <0.03   RADIOLOGY:  Ct Head Wo Contrast  Result  Date: 03/27/2018 CLINICAL DATA:  77 year old male post fall. Denies hitting head. History of dementia. Initial encounter. EXAM: CT HEAD WITHOUT CONTRAST CT CERVICAL SPINE WITHOUT CONTRAST TECHNIQUE: Multidetector CT imaging of the head and cervical spine was performed following the standard protocol without intravenous contrast. Multiplanar CT image reconstructions of the cervical spine were also generated. COMPARISON:  03/26/2018 MR brain and head CT. FINDINGS: CT HEAD FINDINGS Brain: No intracranial hemorrhage or CT evidence of large acute infarct. Chronic microvascular changes. Atrophy. No intracranial mass lesion noted on this unenhanced exam. Vascular: Vascular calcifications. Skull: No skull fracture. Sinuses/Orbits: No acute orbital abnormality. Remote right medial orbital wall fracture. Visualized paranasal sinuses clear. Other: Mastoid air cells and middle ear cavities are clear. CT CERVICAL SPINE FINDINGS Alignment: Normal alignment. Skull base and vertebrae: No cervical spine fracture. Congenital nonunion posterior C1 ring. Soft tissues and spinal canal: No abnormal prevertebral soft tissue swelling. Disc levels: Cervical spondylotic changes with various degrees spinal stenosis and foraminal narrowing C3-4 through C6-7. Upper chest: No worrisome abnormality. Other: Carotid bifurcation calcification with carotid arteries deviated medially. IMPRESSION: No skull fracture or intracranial hemorrhage. No cervical spine fracture, malalignment or abnormal prevertebral soft tissue swelling. Chronic changes as noted above. Electronically Signed   By: Lacy Duverney M.D.   On: 03/27/2018 07:01   Ct Cervical Spine Wo Contrast  Result Date: 03/27/2018 CLINICAL DATA:  77 year old male post fall. Denies hitting head. History of dementia. Initial encounter.  EXAM: CT HEAD WITHOUT CONTRAST CT CERVICAL SPINE WITHOUT CONTRAST TECHNIQUE: Multidetector CT imaging of the head and cervical spine was performed following the  standard protocol without intravenous contrast. Multiplanar CT image reconstructions of the cervical spine were also generated. COMPARISON:  03/26/2018 MR brain and head CT. FINDINGS: CT HEAD FINDINGS Brain: No intracranial hemorrhage or CT evidence of large acute infarct. Chronic microvascular changes. Atrophy. No intracranial mass lesion noted on this unenhanced exam. Vascular: Vascular calcifications. Skull: No skull fracture. Sinuses/Orbits: No acute orbital abnormality. Remote right medial orbital wall fracture. Visualized paranasal sinuses clear. Other: Mastoid air cells and middle ear cavities are clear. CT CERVICAL SPINE FINDINGS Alignment: Normal alignment. Skull base and vertebrae: No cervical spine fracture. Congenital nonunion posterior C1 ring. Soft tissues and spinal canal: No abnormal prevertebral soft tissue swelling. Disc levels: Cervical spondylotic changes with various degrees spinal stenosis and foraminal narrowing C3-4 through C6-7. Upper chest: No worrisome abnormality. Other: Carotid bifurcation calcification with carotid arteries deviated medially. IMPRESSION: No skull fracture or intracranial hemorrhage. No cervical spine fracture, malalignment or abnormal prevertebral soft tissue swelling. Chronic changes as noted above. Electronically Signed   By: Lacy Duverney M.D.   On: 03/27/2018 07:01   Dg Chest Port 1 View  Result Date: 03/27/2018 CLINICAL DATA:  Status post unwitnessed fall, with concern for chest injury. Initial encounter. EXAM: PORTABLE CHEST 1 VIEW COMPARISON:  None. FINDINGS: The lungs are hypoexpanded. Mild bibasilar airspace opacities may reflect atelectasis or possibly pneumonia. There is no evidence of pleural effusion or pneumothorax. The cardiomediastinal silhouette is within normal limits. No acute osseous abnormalities are seen. IMPRESSION: Lungs hypoexpanded. Mild bibasilar airspace opacities may reflect atelectasis or possibly pneumonia. No displaced rib fracture  seen. Electronically Signed   By: Roanna Raider M.D.   On: 03/27/2018 06:50   ASSESSMENT AND PLAN:  Dennis Sandoval  is a 77 y.o. male with a known history of dementia who is at [facility] facility comes to the emergency room after patient was found on the floor by caretakers. According to the son who was in the room patient is not his usual self.  1. sepsis secondary to early pneumonia questionable aspiration and right chin swelling/cellulitis supected dure to poor dental hygiene -patient presented with tachycardia altered mental status elevated white count and positive chest x-ray -IV unasyn -blood cultures neg -PRN pain meds -ENT consult--appreciated. recommends continue antibiotics and follow-up dentist after discharge  2. leukocytosis due to number one  3. Dementia -continue home meds  4. Diabetes with elevated sugars -patient does not take any medications for diabetes -A1c 7.2,cont sliding scale insulin -will start patient on oral metformin  5. DVT prophylaxis subcu Lovenox  Patient's assisted living [place] to come eval see if patient can return back with home health PT son Hervey Ard updated on the phone Case discussed with Care Management/Social Worker. Management plans discussed with the patient, family and they are in agreement.  CODE STATUS: full  DVT Prophylaxis: lovenox  TOTAL TIME TAKING CARE OF THIS PATIENT: 30 minutes.  >50% time spent on counselling and coordination of care  POSSIBLE D/C IN *1-2* DAYS, DEPENDING ON CLINICAL CONDITION.  Note: This dictation was prepared with Dragon dictation along with smaller phrase technology. Any transcriptional errors that result from this process are unintentional.  [name] M.D on 03/28/2018 at 12:58 PM  Between 7am to 6pm - Pager - [phone]  After 6pm go to [email or web address] - password [email or web address]  [employer]  Office  [phone]  CC: Primary care physician; Kerman Passey, MDPatient ID: Glendora Score, male   DOB: 05-03-1941, 77 y.o.   MRN: [id number]

## 2018-03-28 NOTE — Progress Notes (Signed)
Inpatient Diabetes Program Recommendations  AACE/ADA: New Consensus Statement on Inpatient Glycemic Control (2015)  Target Ranges:  Prepandial:   less than 140 mg/dL      Peak postprandial:   less than 180 mg/dL (1-2 hours)      Critically ill patients:  140 - 180 mg/dL   Results for Dennis Sandoval, Dennis Sandoval (MRN [id number]) as of 03/28/2018 12:22  Ref. Range 03/27/2018 08:54 03/27/2018 11:52 03/27/2018 16:53 03/27/2018 21:19  Glucose-Capillary Latest Ref Range: 65 - 99 mg/dL 629 (H)  3 units NOVOLOG  252 (H)  5 units NOVOLOG  234 (H)  3 units NOVOLOG  253 (H)   Results for Dennis Sandoval, Dennis Sandoval (MRN [id number]) as of 03/28/2018 12:22  Ref. Range 03/28/2018 07:43 03/28/2018 12:09  Glucose-Capillary Latest Ref Range: 65 - 99 mg/dL 010 (H)  2 units NOVOLOG  241 (H)   Results for Dennis Sandoval, Dennis Sandoval (MRN [id number]) as of 03/28/2018 12:22  Ref. Range 03/27/2018 06:15  Hemoglobin A1C Latest Ref Range: 4.8 - 5.6 % 7.2 (H)     Admit with: AMS/ Fall  History: Dementia, DM  Home DM Meds: None  Current Insulin Orders: Novolog Sensitive Correction Scale/ SSI (0-9 units) TID AC      Metformin 500 mg BID    Note Metformin 500 mg BID to start today at 5pm.  Still having glucose elevations >200 mg/dl despite addition of Novolog SSI.    MD- Please consider the following in-hospital insulin adjustments- Metformin may take several days to reach therapeutic levels:  1. Start low dose basal insulin: Lantus 5 units QHS (0.05 units/kg dosing based on weight of 93 kg)  2. Start low dose Novolog Meal Coverage: Novolog 3 units TID with meals (hold if pt eats <50% of meal)    --Will follow patient during hospitalization--  Ambrose Finland RN, MSN, CDE Diabetes Coordinator Inpatient Glycemic Control Team Team Pager: [phone] (8a-5p)

## 2018-03-28 NOTE — Plan of Care (Signed)
  Problem: Activity: Goal: Ability to tolerate increased activity will improve Outcome: Progressing   Problem: Respiratory: Goal: Ability to maintain a clear airway will improve Outcome: Progressing   Problem: Education: Goal: Knowledge of General Education information will improve Outcome: Progressing   Problem: Nutrition: Goal: Adequate nutrition will be maintained Outcome: Progressing   Problem: Elimination: Goal: Will not experience complications related to bowel motility Outcome: Progressing   Problem: Pain Managment: Goal: General experience of comfort will improve Outcome: Progressing   Problem: Safety: Goal: Ability to remain free from injury will improve Outcome: Progressing

## 2018-03-29 LAB — GLUCOSE, CAPILLARY
GLUCOSE-CAPILLARY: 226 mg/dL — AB (ref 65–99)
Glucose-Capillary: 163 mg/dL — ABNORMAL HIGH (ref 65–99)

## 2018-03-29 MED ORDER — TRAZODONE HCL 50 MG PO TABS: 50.0000 mg | ORAL_TABLET | Freq: Every evening | ORAL | 0 refills | Status: AC | PRN

## 2018-03-29 MED ORDER — AMOXICILLIN-POT CLAVULANATE 875-125 MG PO TABS
1.0000 | ORAL_TABLET | Freq: Two times a day (BID) | ORAL | 0 refills | Status: DC
Start: 2018-03-29 — End: 2018-06-05

## 2018-03-29 MED ORDER — AMOXICILLIN-POT CLAVULANATE 875-125 MG PO TABS
1.0000 | ORAL_TABLET | Freq: Two times a day (BID) | ORAL | Status: DC
  Administered 2018-03-29: 11:00:00 1 via ORAL
  Filled 2018-03-29: qty 1

## 2018-03-29 NOTE — Evaluation (Signed)
Occupational Therapy Evaluation Patient Details Name: Dennis Sandoval MRN: [id number] DOB: 08-29-1941 Today's Date: 03/29/2018    History of Present Illness Pt is a 77 y/o M who presented to the ED after found on the floor by caretakers. Pt more lethargic than usual.  Chest x-ray with possible developing early pneumonia along with R facial chin swelling/abscess. Pt presented to the ED just the day prior with possible facial droop but MRI and CT head negative and pt returned to ALF.  Pt's PMH includes Alzheimer disease.   Clinical Impression   Pt seen for OT evaluation this date. Prior to hospital admission, pt was living at [facility] with his spouse, per chart review. Unclear whether pt required assist for basic ADL tasks.  Currently pt demonstrates impairments in cognition (dementia at baseline, pleasant, oriented to self, follows 1 step commands consistently with verbal cues dor sequencing, initiating, and terminating tasks) requiring min assist to initiate LB dressing over feet while seated and CGA for completing donning over hips in standing. Verbal cues for sequencing for seated grooming tasks, LB bathing tasks (with CGA when in standing), and CGA for bed mobility and with no AD for ambulating 4 feet to recliner. >23 minutes aside from evaluation spent on self care skills training and functional mobility during ADL. Pt would benefit from skilled OT to address noted impairments and functional limitations (see below for any additional details) in order to maximize safety and independence while minimizing falls risk and caregiver burden.  Upon hospital discharge, recommend pt discharge back to ALF with 24/7 supervision/assist and HHOT services.    Follow Up Recommendations  Home health OT;Supervision/Assistance - 24 hour(return to ALF w/ HHOT)    Equipment Recommendations  None recommended by OT    Recommendations for Other Services       Precautions / Restrictions  Precautions Precautions: Fall;Other (comment) Precaution Comments: monitor pulse Restrictions Weight Bearing Restrictions: No      Mobility Bed Mobility Overal bed mobility: Needs Assistance Bed Mobility: Supine to Sit     Supine to sit: Min guard     General bed mobility comments: VC for sequencing  Transfers Overall transfer level: Needs assistance Equipment used: None Transfers: Sit to/from Stand;Lateral/Scoot Transfers Sit to Stand: Min guard        Lateral/Scoot Transfers: Min guard General transfer comment: improving stability and balance with each of multiple transfers during bathing and dressing tasks EOB, CGA throughout, VC for sequencing    Balance Overall balance assessment: Needs assistance;History of Falls Sitting-balance support: No upper extremity supported;Feet supported Sitting balance-Leahy Scale: Fair     Standing balance support: No upper extremity supported Standing balance-Leahy Scale: Fair                             ADL either performed or assessed with clinical judgement   ADL Overall ADL's : Needs assistance/impaired Eating/Feeding: Sitting;Set up;Cueing for sequencing   Grooming: Sitting;Set up;Oral care;Supervision/safety;Cueing for sequencing Grooming Details (indicate cue type and reason): verbal cues for sequencing of each step, cue to terminate brushing and initiate spitting toothpaste into basin Upper Body Bathing: Sitting;Set up;Cueing for sequencing;Supervision/ safety Upper Body Bathing Details (indicate cue type and reason): close SBA Lower Body Bathing: Sit to/from stand;Min guard;Set up;Cueing for sequencing Lower Body Bathing Details (indicate cue type and reason): pt able to perform LB and pericare in standing with CGA for balance and cues to initiate and terminate Upper Body Dressing :  Sitting;Set up;Cueing for sequencing;Min guard   Lower Body Dressing: Sit to/from stand;Minimal assistance;Cueing for  sequencing Lower Body Dressing Details (indicate cue type and reason): min assist to initiate threading of disposable undergarments over feet and CGA in standing for pt to complete donning over hips with VC for sequencing Toilet Transfer: Ambulation;Comfort height toilet;Min guard   Toileting- Clothing Manipulation and Hygiene: Sit to/from stand;Min guard         General ADL Comments: verbal cues for 1 step sequencing of tasks with good follow through     Vision Patient Visual Report: (difficult to assess 2:2 cognition, no glasses, no overt visual deficits ) Vision Assessment?: No apparent visual deficits     Perception     Praxis      Pertinent Vitals/Pain Pain Assessment: No/denies pain     Hand Dominance Right   Extremity/Trunk Assessment Upper Extremity Assessment Upper Extremity Assessment: Difficult to assess due to impaired cognition(at least 4/5 bilaterally)   Lower Extremity Assessment Lower Extremity Assessment: Difficult to assess due to impaired cognition(at least 4/5 bilaterally)   Cervical / Trunk Assessment Cervical / Trunk Assessment: Kyphotic   Communication Communication Communication: No difficulties;Other (comment)(additional time to respond to confrontational questioning)   Cognition Arousal/Alertness: Awake/alert Behavior During Therapy: WFL for tasks assessed/performed Overall Cognitive Status: History of cognitive impairments - at baseline                                 General Comments: Pt very pleasant, oriented to self. Follows all 1 step commands well with slight increase in time to initiate. Requires verbal cues for sequencing of tasks, to initiate a task, and terminate a task.    General Comments       Exercises Other Exercises Other Exercises: RN educated during session on level of assist/cues required to maximally support pt   Shoulder Instructions      Home Living Family/patient expects to be discharged to::  Assisted living                                 Additional Comments: From [facility]      Prior Functioning/Environment          Comments: Per chart review: Unsure of the amount of assist the pt requires at baseline.  Per nephew who did not seem confident in his answers: the pt ambulates without AD at baseline but has had an increase in frequency of falling.  Pt lives in same room as wife at [facility] and the nephew believes the pt assists his wife at times.          OT Problem List: Decreased cognition;Decreased activity tolerance;Impaired balance (sitting and/or standing);Decreased safety awareness;Decreased knowledge of use of DME or AE      OT Treatment/Interventions: Self-care/ADL training;Balance training;Therapeutic exercise;Therapeutic activities;Cognitive remediation/compensation;DME and/or AE instruction;Patient/family education    OT Goals(Current goals can be found in the care plan section) Acute Rehab OT Goals Patient Stated Goal: go home to my wife OT Goal Formulation: With patient Time For Goal Achievement: 04/12/18 Potential to Achieve Goals: Good ADL Goals Pt Will Transfer to Toilet: with supervision;ambulating Additional ADL Goal #1: Pt will perform all grooming tasks with set up and supervision with verbal cues for sequencing, initiation, and task completion  OT Frequency: Min 1X/week   Barriers to D/C:  Co-evaluation              AM-PAC PT "6 Clicks" Daily Activity     Outcome Measure Help from another person eating meals?: A Little Help from another person taking care of personal grooming?: A Little Help from another person toileting, which includes using toliet, bedpan, or urinal?: A Little Help from another person bathing (including washing, rinsing, drying)?: A Little Help from another person to put on and taking off regular upper body clothing?: A Little Help from another person to put on and taking off regular  lower body clothing?: A Little 6 Click Score: 18   End of Session Equipment Utilized During Treatment: Gait belt Nurse Communication: Mobility status(notified RN that pt voided)  Activity Tolerance: Patient tolerated treatment well Patient left: in chair;with call bell/phone within reach;with chair alarm set  OT Visit Diagnosis: Other abnormalities of gait and mobility (R26.89);Other symptoms and signs involving cognitive function;History of falling (Z91.81)                Time: 1610-9604 OT Time Calculation (min): 36 min Charges:  OT General Charges $OT Visit: 1 Visit OT Evaluation $OT Eval Moderate Complexity: 1 Mod OT Treatments $Self Care/Home Management : 23-37 mins  Richrd Prime, MPH, MS, OTR/L ascom [phone] 03/29/18, 11:34 AM

## 2018-03-29 NOTE — Care Management Note (Signed)
Case Management Note  Patient Details  Name: Dennis Sandoval MRN: [id number] Date of Birth: [birth year]  Subjective/Objective:  Admitted to [facility] with the diagnosis of sepsis. A resident of [facility]. Sees Dr. Sherie Don as primary care physician.                Action/Plan: Received referral for long term care. Clinical Social Worker will assess for discharge plans. Will continue to follow as needed.   Expected Discharge Date:                  Expected Discharge Plan:     In-House Referral:     Discharge planning Services     Post Acute Care Choice:    Choice offered to:     DME Arranged:    DME Agency:     HH Arranged:    HH Agency:     Status of Service:     If discussed at [employer], dates discussed:    Additional Comments:  Gwenette Greet, RN MSN CCM Care Management [phone] 03/29/2018, 8:07 AM

## 2018-03-29 NOTE — Discharge Summary (Signed)
[facility] [place] [facility]   PATIENT NAME: Dennis Sandoval    MR#:  [id number]  DATE OF BIRTH:  Sep 16, 1941  DATE OF ADMISSION:  03/27/2018 ADMITTING PHYSICIAN: [name], MD  DATE OF DISCHARGE: 03/29/2018  PRIMARY CARE PHYSICIAN: Kerman Passey, MD    ADMISSION DIAGNOSIS:  Altered mental status, unspecified altered mental status type [R41.82] Community acquired pneumonia, unspecified laterality [J18.9]  DISCHARGE DIAGNOSIS:  Sepsis due to Right Chin cellulitis suspected due to dental issues Pneumonia  Advance dementia SECONDARY DIAGNOSIS:   Past Medical History:  Diagnosis Date  . Alzheimer disease 06/16/2015  . Dementia   . Diabetes mellitus without complication (HCC)   . Hypertension     [place]77 [place]ith a known history of dementia who is at [facility] facility comes to the emergency room after patient was found on the floor by caretakers. According to the son who was in the room patient is not his usual self.  1.sepsis secondary to early pneumonia questionable aspiration and right chin swelling/cellulitis supected dure to poor dental hygiene -patient presented with tachycardia altered mental status elevated white count and positive chest x-ray -IV unasyn--change to po augmentin -blood cultures neg -PRN pain meds -ENT consult--appreciated. recommends continue antibiotics and follow-up dentist after discharge  2.leukocytosis due to number one  3.Dementia -continue home meds  4.Diabetes with elevated sugars -patientdoesnot take any medications for diabetes -A1c 7.2,contsliding scale insulin -will start patient on oral metformin  5.DVT prophylaxis subcu Lovenox  Overall improving.  D/c to [facility] today with PT Spoke with Odette Horns   CONSULTS OBTAINED:  Treatment Team:  Vernie Murders, MD  DRUG ALLERGIES:  No Known Allergies  DISCHARGE MEDICATIONS:    Allergies as of 03/29/2018   No Known Allergies     Medication List    TAKE these medications   amoxicillin-clavulanate 875-125 MG tablet Commonly known as:  AUGMENTIN Take 1 tablet by mouth every 12 (twelve) hours.   divalproex 125 MG capsule Commonly known as:  DEPAKOTE SPRINKLE Take 125 mg by mouth every 12 (twelve) hours.   donepezil 10 MG tablet Commonly known as:  ARICEPT Take 1 tablet (10 mg total) by mouth at bedtime. What changed:  when to take this   escitalopram 10 MG tablet Commonly known as:  LEXAPRO Take 0.5 tablets (5 mg total) by mouth daily. (for mood) What changed:    how much to take  additional instructions   memantine 5 MG tablet Commonly known as:  NAMENDA Take 1 tablet (5 mg total) by mouth 2 (two) times daily.   OLANZapine zydis 5 MG disintegrating tablet Commonly known as:  ZYPREXA Take 5 mg by mouth at bedtime.   OLANZapine 2.5 MG tablet Commonly known as:  ZYPREXA Take 2.5 mg by mouth every 12 (twelve) hours as needed (agitation).   traZODone 50 MG tablet Commonly known as:  DESYREL Take 1 tablet (50 mg total) by mouth at bedtime as needed for sleep. What changed:    when to take this  reasons to take this       If you experience worsening of your admission symptoms, develop shortness of breath, life threatening emergency, suicidal or homicidal thoughts you must seek medical attention immediately by calling 911 or calling your MD immediately  if symptoms less severe.  You Must read complete instructions/literature along with all the possible adverse reactions/side effects for all the Medicines you take and that have been prescribed  to you. Take any new Medicines after you have completely understood and accept all the possible adverse reactions/side effects.   Please note  You were cared for by a hospitalist during your hospital stay. If you have any questions about your discharge medications or the care you received while you  were in the hospital after you are discharged, you can call the unit and asked to speak with the hospitalist on call if the hospitalist that took care of you is not available. Once you are discharged, your primary care physician will handle any further medical issues. Please note that NO REFILLS for any discharge medications will be authorized once you are discharged, as it is imperative that you return to your primary care physician (or establish a relationship with a primary care physician if you do not have one) for your aftercare needs so that they can reassess your need for medications and monitor your lab values. Today   SUBJECTIVE   Doing well. Held good conversation today VITAL SIGNS:  Blood pressure (!) 140/93, pulse 77, temperature 98.1 F (36.7 C), temperature source Oral, resp. rate 18, weight 93.4 kg (206 lb), SpO2 96 %.  I/O:  No intake or output data in the 24 hours ending 03/29/18 0853  PHYSICAL EXAMINATION:  GENERAL:  77 y.o.-year-old patient lying in the bed with no acute distress.  EYES: Pupils equal, round, reactive to light and accommodation. No scleral icterus. Extraocular muscles intact.  HEENT: Head atraumatic, normocephalic. Oropharynx and nasopharynx clear. Right chin swelling decreased NECK:  Supple, no jugular venous distention. No thyroid enlargement, no tenderness.  LUNGS: Normal breath sounds bilaterally, no wheezing, rales,rhonchi or crepitation. No use of accessory muscles of respiration.  CARDIOVASCULAR: S1, S2 normal. No murmurs, rubs, or gallops.  ABDOMEN: Soft, non-tender, non-distended. Bowel sounds present. No organomegaly or mass.  EXTREMITIES: No pedal edema, cyanosis, or clubbing.  NEUROLOGIC: Cranial nerves II through XII are intact. Muscle strength 5/5 in all extremities. Sensation intact. Gait not checked.  PSYCHIATRIC:  patient is alert.. dementia SKIN: No obvious rash, lesion, or ulcer.   DATA REVIEW:   CBC  Recent Labs  Lab 03/27/18 0615   WBC 14.1*  HGB 13.0  HCT 39.7*  PLT 249    Chemistries  Recent Labs  Lab 03/27/18 0615  NA 132*  K 4.6  CL 98*  CO2 27  GLUCOSE 295*  BUN 9  CREATININE 0.82  CALCIUM 8.7*  AST 26  ALT 12*  ALKPHOS 71  BILITOT 1.1    Microbiology Results   Recent Results (from the past 240 hour(s))  Urine culture     Status: None   Collection Time: 03/27/18  7:31 AM  Result Value Ref Range Status   Specimen Description   Final    URINE, RANDOM Performed at [facility], [address], [place], [place] [address]    Special Requests   Final    NONE Performed at [facility], [address] [place], [place] [address]    Culture   Final    NO GROWTH Performed at [facility], [address] [place], [place] [address]    Report Status 03/28/2018 FINAL  Final    RADIOLOGY:  No results found.   Management plans discussed with the patient, family and they are in agreement.  CODE STATUS:     Code Status Orders  (From admission, onward)        Start     Ordered   03/27/18  1610  Full code  Continuous     03/27/18 0842    Code Status History    This patient has a current code status but no historical code status.      TOTAL TIME TAKING CARE OF THIS PATIENT: *40 minutes.    [name] M.D on 03/29/2018 at 8:53 AM  Between 7am to 6pm - Pager - [phone] After 6pm go to [email or web address] - Social research officer, government  [facility]  Office  [phone]  CC: Primary care physician; Kerman Passey, MD

## 2018-03-29 NOTE — Clinical Social Work Note (Signed)
Patient is medically stable for discharge back to [facility] today. CSW notified patient and patient's son Sherron Mummert [phone] of discharge. CSW also notified [facility] of discharge today. CSW faxed FL2 and Discharge summary to [facility]. Patient's son Casimiro Needle will come pick up patient after work today. CSW notified RN and they will call report. CSW signing off. Please re consult if needs arise.   Ruthe Mannan MSW, [address] [phone]

## 2018-03-29 NOTE — Progress Notes (Signed)
Pt has been discharged to [facility]. Report given to Rubye Oaks, Med Tech. Discharge papers given and explained to pt's son, verbalized understanding. Meds and f/u appointment reviewed. Discharge packet given.

## 2018-03-30 DIAGNOSIS — E559 Vitamin D deficiency, unspecified: Secondary | ICD-10-CM | POA: Diagnosis not present

## 2018-03-30 DIAGNOSIS — E1165 Type 2 diabetes mellitus with hyperglycemia: Secondary | ICD-10-CM | POA: Diagnosis not present

## 2018-03-30 DIAGNOSIS — I10 Essential (primary) hypertension: Secondary | ICD-10-CM | POA: Diagnosis not present

## 2018-04-06 DIAGNOSIS — Z79899 Other long term (current) drug therapy: Secondary | ICD-10-CM | POA: Diagnosis not present

## 2018-04-20 DIAGNOSIS — R251 Tremor, unspecified: Secondary | ICD-10-CM | POA: Diagnosis not present

## 2018-04-20 DIAGNOSIS — R451 Restlessness and agitation: Secondary | ICD-10-CM | POA: Diagnosis not present

## 2018-04-20 DIAGNOSIS — R609 Edema, unspecified: Secondary | ICD-10-CM | POA: Diagnosis not present

## 2018-04-25 ENCOUNTER — Emergency Department: Payer: Medicare Other

## 2018-04-25 ENCOUNTER — Other Ambulatory Visit: Payer: Self-pay

## 2018-04-25 ENCOUNTER — Emergency Department
Admission: EM | Admit: 2018-04-25 | Discharge: 2018-04-25 | Disposition: A | Discharge status: Home / Self Care | Payer: Medicare Other | Attending: Emergency Medicine | Admitting: Emergency Medicine

## 2018-04-25 DIAGNOSIS — R4182 Altered mental status, unspecified: Secondary | ICD-10-CM | POA: Diagnosis present

## 2018-04-25 DIAGNOSIS — R4020 Unspecified coma: Secondary | ICD-10-CM | POA: Diagnosis not present

## 2018-04-25 DIAGNOSIS — Z87891 Personal history of nicotine dependence: Secondary | ICD-10-CM | POA: Insufficient documentation

## 2018-04-25 DIAGNOSIS — Z79899 Other long term (current) drug therapy: Secondary | ICD-10-CM | POA: Diagnosis not present

## 2018-04-25 DIAGNOSIS — E119 Type 2 diabetes mellitus without complications: Secondary | ICD-10-CM | POA: Diagnosis not present

## 2018-04-25 DIAGNOSIS — I10 Essential (primary) hypertension: Secondary | ICD-10-CM | POA: Diagnosis not present

## 2018-04-25 DIAGNOSIS — G309 Alzheimer's disease, unspecified: Secondary | ICD-10-CM | POA: Insufficient documentation

## 2018-04-25 LAB — URINE DRUG SCREEN, QUALITATIVE (ARMC ONLY)
AMPHETAMINES, UR SCREEN: NOT DETECTED
BENZODIAZEPINE, UR SCRN: NOT DETECTED
Barbiturates, Ur Screen: NOT DETECTED
Cannabinoid 50 Ng, Ur ~~LOC~~: NOT DETECTED
Cocaine Metabolite,Ur ~~LOC~~: NOT DETECTED
MDMA (ECSTASY) UR SCREEN: NOT DETECTED
METHADONE SCREEN, URINE: NOT DETECTED
Opiate, Ur Screen: NOT DETECTED
PHENCYCLIDINE (PCP) UR S: NOT DETECTED
Tricyclic, Ur Screen: NOT DETECTED

## 2018-04-25 LAB — URINALYSIS, COMPLETE (UACMP) WITH MICROSCOPIC
Bacteria, UA: NONE SEEN
Bilirubin Urine: NEGATIVE
KETONES UR: 5 mg/dL — AB
Leukocytes, UA: NEGATIVE
Nitrite: NEGATIVE
PH: 6 (ref 5.0–8.0)
PROTEIN: NEGATIVE mg/dL
Specific Gravity, Urine: 1.028 (ref 1.005–1.030)

## 2018-04-25 LAB — COMPREHENSIVE METABOLIC PANEL
ALK PHOS: 69 U/L (ref 38–126)
ALT: 17 U/L (ref 17–63)
AST: 17 U/L (ref 15–41)
Albumin: 3.7 g/dL (ref 3.5–5.0)
Anion gap: 10 (ref 5–15)
BUN: 13 mg/dL (ref 6–20)
CALCIUM: 9.5 mg/dL (ref 8.9–10.3)
CHLORIDE: 100 mmol/L — AB (ref 101–111)
CO2: 27 mmol/L (ref 22–32)
CREATININE: 0.92 mg/dL (ref 0.61–1.24)
GFR calc Af Amer: >60 mL/min (ref >60)
GFR calc non Af Amer: >60 mL/min (ref >60)
GLUCOSE: 236 mg/dL — AB (ref 65–99)
Potassium: 3.6 mmol/L (ref 3.5–5.1)
SODIUM: 137 mmol/L (ref 135–145)
Total Bilirubin: 0.4 mg/dL (ref 0.3–1.2)
Total Protein: 8 g/dL (ref 6.5–8.1)

## 2018-04-25 LAB — PROTIME-INR
INR: 1.08
Prothrombin Time: 13.9 seconds (ref 11.4–15.2)

## 2018-04-25 LAB — AMMONIA: AMMONIA: 19 umol/L (ref 9–35)

## 2018-04-25 LAB — CBC WITH DIFFERENTIAL/PLATELET
BASOS ABS: 0 10*3/uL (ref 0–0.1)
Basophils Relative: 1 %
EOS PCT: 3 %
Eosinophils Absolute: 0.2 10*3/uL (ref 0–0.7)
HEMATOCRIT: 43.5 % (ref 40.0–52.0)
Hemoglobin: 13.7 g/dL (ref 13.0–18.0)
LYMPHS PCT: 22 %
Lymphs Abs: 1.7 10*3/uL (ref 1.0–3.6)
MCH: 25.6 pg — AB (ref 26.0–34.0)
MCHC: 31.6 g/dL — ABNORMAL LOW (ref 32.0–36.0)
MCV: 81.1 fL (ref 80.0–100.0)
MONO ABS: 0.8 10*3/uL (ref 0.2–1.0)
MONOS PCT: 11 %
NEUTROS ABS: 4.7 10*3/uL (ref 1.4–6.5)
Neutrophils Relative %: 63 %
Platelets: 243 10*3/uL (ref 150–440)
RBC: 5.36 MIL/uL (ref 4.40–5.90)
RDW: 19.7 % — AB (ref 11.5–14.5)
WBC: 7.5 10*3/uL (ref 3.8–10.6)

## 2018-04-25 LAB — TSH: TSH: 2.324 u[IU]/mL (ref 0.350–4.500)

## 2018-04-25 LAB — T4, FREE: FREE T4: 0.64 ng/dL — AB (ref 0.82–1.77)

## 2018-04-25 LAB — TROPONIN I: Troponin I: <0.03 ng/mL (ref <0.03)

## 2018-04-25 NOTE — ED Notes (Signed)
Pt responding to MD and RN's at this time. Pt able to open eyes and speaking to RN at this time.

## 2018-04-25 NOTE — ED Provider Notes (Signed)
[facility] Emergency Department Provider Note  ____________________________________________   First MD Initiated Contact with Patient 04/25/18 1834     (approximate)  I have reviewed the triage vital signs and the nursing notes.   HISTORY  Chief Complaint Altered Mental Status  Level 5 exemption history limited by the patient's dementia  HPI [name] is a 77 y.o. male is brought to the emergency department by EMS from his nursing home for an episode of "altered mental status" and difficulty arousing today.  He has a long-standing history of severe dementia as well as diabetes and hypertension.  Staff noted no fevers or chills.  No trauma.  They said that it was difficult to get him to wake up although his baseline is difficult to assess at this point.  Past Medical History:  Diagnosis Date  . Alzheimer disease 06/16/2015  . Dementia   . Diabetes mellitus without complication (HCC)   . Hypertension     Patient Active Problem List   Diagnosis Date Noted  . Sepsis (HCC) 03/27/2018  . 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 History of BPH 12/21/2016  . Medication monitoring encounter 01/28/2016  . Hyperglycemia 01/28/2016  . Preventative health care 12/21/2015  . Need for pneumococcal vaccination 12/21/2015  . Screening for AAA (abdominal aortic aneurysm) 12/01/2015  . Smoking history 11/28/2015  . Dementia 07/25/2015  . Benign hypertension 07/25/2015    Past Surgical History:  Procedure Laterality Date  . PROSTATE SURGERY      Prior to Admission medications   Medication Sig Start Date End Date Taking? Authorizing Provider  amoxicillin-clavulanate (AUGMENTIN) 875-125 MG tablet Take 1 tablet by mouth every 12 (twelve) hours. 03/29/18   [name], MD  divalproex (DEPAKOTE SPRINKLE) 125 MG capsule Take 125 mg by mouth every 12 (twelve) hours.     [provider]  donepezil (ARICEPT) 10  MG tablet Take 1 tablet (10 mg total) by mouth at bedtime. Patient taking differently: Take 10 mg by mouth daily.  01/14/16   Lada, Janit Bern, MD  escitalopram (LEXAPRO) 10 MG tablet Take 0.5 tablets (5 mg total) by mouth daily. (for mood) Patient taking differently: Take 10 mg by mouth daily.  01/14/16   Lada, Janit Bern, MD  memantine (NAMENDA) 5 MG tablet Take 1 tablet (5 mg total) by mouth 2 (two) times daily. 04/14/16   Lada, Janit Bern, MD  OLANZapine (ZYPREXA) 2.5 MG tablet Take 2.5 mg by mouth every 12 (twelve) hours as needed (agitation).    [provider]  OLANZapine zydis (ZYPREXA) 5 MG disintegrating tablet Take 5 mg by mouth at bedtime.    [provider]  traZODone (DESYREL) 50 MG tablet Take 1 tablet (50 mg total) by mouth at bedtime as needed for sleep. 03/29/18   [name], MD    Allergies Patient has no known allergies.  Family History  Problem Relation Age of Onset  . Dementia Mother   . Dementia Father   . Cancer Brother        not sure what type  . Alcohol abuse Brother   . Heart disease Neg Hx   . Diabetes Neg Hx   . Stroke Neg Hx     Social History Social History   Tobacco Use  . Smoking status: Former Smoker    Packs/day: 0.50    Years: 50.00    Pack years: 25.00    Types: Cigarettes    Last attempt to  quit: 11/27/1985    Years since quitting: 32.4  . Smokeless tobacco: Never Used  Substance Use Topics  . Alcohol use: No  . Drug use: No    Review of Systems Level 5 exemption history limited by the patient's dementia ____________________________________________   PHYSICAL EXAM:  VITAL SIGNS: ED Triage Vitals [04/25/18 1833]  Enc Vitals Group     BP      Pulse      Resp      Temp      Temp src      SpO2      Weight 180 lb (81.6 kg)     Height 6\' 4"  (1.93 m)     Head Circumference      Peak Flow      Pain Score      Pain Loc      Pain Edu?      Excl. in GC?     Constitutional: Speaks in slow methodical sentences with  severe dementia.  No acute distress Eyes: PERRL EOMI. midrange and brisk Head: Atraumatic. Nose: No congestion/rhinnorhea. Mouth/Throat: No trismus Neck: No stridor.   Cardiovascular: Normal rate, regular rhythm. Grossly normal heart sounds.  Good peripheral circulation. Respiratory: Normal respiratory effort.  No retractions. Lungs CTAB and moving good air Gastrointestinal: Soft nontender.  Diaper dependent Musculoskeletal: No lower extremity edema   Neurologic: Flapping asterixis bilateral hands Skin:  Skin is warm, dry and intact. No rash noted. Psychiatric: Severe dementia    ____________________________________________   DIFFERENTIAL includes but not limited to  Urinary tract infection, pneumonia, intracerebral hemorrhage, subdural hematoma, metabolic derangement, dehydration ____________________________________________   LABS (all labs ordered are listed, but only abnormal results are displayed)  Labs Reviewed  URINALYSIS, COMPLETE (UACMP) WITH MICROSCOPIC - Abnormal; Notable for the following components:      Result Value   Color, Urine YELLOW (*)    APPearance CLEAR (*)    Glucose, UA >=500 (*)    Hgb urine dipstick SMALL (*)    Ketones, ur 5 (*)    All other components within normal limits  COMPREHENSIVE METABOLIC PANEL - Abnormal; Notable for the following components:   Chloride 100 (*)    Glucose, Bld 236 (*)    All other components within normal limits  CBC WITH DIFFERENTIAL/PLATELET - Abnormal; Notable for the following components:   MCH 25.6 (*)    MCHC 31.6 (*)    RDW 19.7 (*)    All other components within normal limits  T4, FREE - Abnormal; Notable for the following components:   Free T4 0.64 (*)    All other components within normal limits  URINE DRUG SCREEN, QUALITATIVE ([facility] ONLY)  PROTIME-INR  TROPONIN I  AMMONIA  TSH    Lab work reviewed by me with no acute etiology of the symptoms  identified __________________________________________  EKG  ED ECG REPORT I, Merrily Brittle, the attending physician, personally viewed and interpreted this ECG.  Date: 04/25/2018 EKG Time:  Rate: 83 Rhythm: normal sinus rhythm QRS Axis: Leftward axis Intervals: normal ST/T Wave abnormalities: normal Narrative Interpretation: no evidence of acute ischemia  ____________________________________________  RADIOLOGY  Head CT reviewed by me with chronic changes but no acute disease ____________________________________________   PROCEDURES  Procedure(s) performed: no  Procedures  Critical Care performed: o  Observation: no ____________________________________________   INITIAL IMPRESSION / ASSESSMENT AND PLAN / ED COURSE  Pertinent labs & imaging results that were available during my care of the patient were reviewed by me and  considered in my medical decision making (see chart for details).  The patient arrives with severe dementia.  Difficult to assess his baseline and unclear if he has hepatic encephalopathy versus subdural hematoma etc.  Broad labs and head CT are pending.  Patient's medical work-up is unrevealing for the etiology of his altered mental status.  Family is now at bedside and indicates that the patient is currently at his baseline.  At this point he is medically stable for outpatient management family verbalizes understanding agreement the plan with strict return precautions given.      ____________________________________________   FINAL CLINICAL IMPRESSION(S) / ED DIAGNOSES  Final diagnoses:  Altered mental status, unspecified altered mental status type      NEW MEDICATIONS STARTED DURING THIS VISIT:  Discharge Medication List as of 04/25/2018  8:26 PM       Note:  This document was prepared using Dragon voice recognition software and may include unintentional dictation errors.     Merrily Brittleifenbark, Dennis Figgs, MD 04/27/18 [phone]

## 2018-04-25 NOTE — ED Triage Notes (Signed)
[facility]/o [facility]. Per EMS pt only responsive to sternal rub. EMS reports EKG-NS, BS-264, BP-172/90, HR-88. EMS also states pinpoint pupils but no hx of opioids medications. Pt not responding to staff at this time.pt also reported to have dementia.

## 2018-04-25 NOTE — Discharge Instructions (Signed)
Please have the patient begin a diabetic diet.  Follow up with your PMD within 2 days and return to the ED for any concerns.  It was a pleasure to take care of you today, and thank you for coming to our emergency department.  If you have any questions or concerns before leaving please ask the nurse to grab me and I'm more than happy to go through your aftercare instructions again.  If you were prescribed any opioid pain medication today such as Norco, Vicodin, Percocet, morphine, hydrocodone, or oxycodone please make sure you do not drive when you are taking this medication as it can alter your ability to drive safely.  If you have any concerns once you are home that you are not improving or are in fact getting worse before you can make it to your follow-up appointment, please do not hesitate to call 911 and come back for further evaluation.  Merrily Brittle, MD  Results for orders placed or performed during the hospital encounter of 04/25/18  Urinalysis, Complete w Microscopic  Result Value Ref Range   Color, Urine YELLOW (A) YELLOW   APPearance CLEAR (A) CLEAR   Specific Gravity, Urine 1.028 1.005 - 1.030   pH 6.0 5.0 - 8.0   Glucose, UA >=500 (A) NEGATIVE mg/dL   Hgb urine dipstick SMALL (A) NEGATIVE   Bilirubin Urine NEGATIVE NEGATIVE   Ketones, ur 5 (A) NEGATIVE mg/dL   Protein, ur NEGATIVE NEGATIVE mg/dL   Nitrite NEGATIVE NEGATIVE   Leukocytes, UA NEGATIVE NEGATIVE   RBC / HPF 6-10 0 - 5 RBC/hpf   WBC, UA 0-5 0 - 5 WBC/hpf   Bacteria, UA NONE SEEN NONE SEEN   Squamous Epithelial / LPF 0-5 0 - 5   Mucus PRESENT   Urine Drug Screen, Qualitative  Result Value Ref Range   Tricyclic, Ur Screen NONE DETECTED NONE DETECTED   Amphetamines, Ur Screen NONE DETECTED NONE DETECTED   MDMA (Ecstasy)Ur Screen NONE DETECTED NONE DETECTED   Cocaine Metabolite,Ur [place] NONE DETECTED NONE DETECTED   Opiate, Ur Screen NONE DETECTED NONE DETECTED   Phencyclidine (PCP) Ur S NONE DETECTED NONE  DETECTED   Cannabinoid 50 Ng, Ur [place] NONE DETECTED NONE DETECTED   Barbiturates, Ur Screen NONE DETECTED NONE DETECTED   Benzodiazepine, Ur Scrn NONE DETECTED NONE DETECTED   Methadone Scn, Ur NONE DETECTED NONE DETECTED  Comprehensive metabolic panel  Result Value Ref Range   Sodium 137 135 - 145 mmol/L   Potassium 3.6 3.5 - 5.1 mmol/L   Chloride 100 (L) 101 - 111 mmol/L   CO2 27 22 - 32 mmol/L   Glucose, Bld 236 (H) 65 - 99 mg/dL   BUN 13 6 - 20 mg/dL   Creatinine, Ser 1.61 0.61 - 1.24 mg/dL   Calcium 9.5 8.9 - 09.6 mg/dL   Total Protein 8.0 6.5 - 8.1 g/dL   Albumin 3.7 3.5 - 5.0 g/dL   AST 17 15 - 41 U/L   ALT 17 17 - 63 U/L   Alkaline Phosphatase 69 38 - 126 U/L   Total Bilirubin 0.4 0.3 - 1.2 mg/dL   GFR calc non Af Amer >60 >60 mL/min   GFR calc Af Amer >60 >60 mL/min   Anion gap 10 5 - 15  Protime-INR  Result Value Ref Range   Prothrombin Time 13.9 11.4 - 15.2 seconds   INR 1.08   CBC with Differential  Result Value Ref Range   WBC 7.5 3.8 - 10.6  K/uL   RBC 5.36 4.40 - 5.90 MIL/uL   Hemoglobin 13.7 13.0 - 18.0 g/dL   HCT 16.1 09.6 - 04.5 %   MCV 81.1 80.0 - 100.0 fL   MCH 25.6 (L) 26.0 - 34.0 pg   MCHC 31.6 (L) 32.0 - 36.0 g/dL   RDW 40.9 (H) 81.1 - 91.4 %   Platelets 243 150 - 440 K/uL   Neutrophils Relative % 63 %   Neutro Abs 4.7 1.4 - 6.5 K/uL   Lymphocytes Relative 22 %   Lymphs Abs 1.7 1.0 - 3.6 K/uL   Monocytes Relative 11 %   Monocytes Absolute 0.8 0.2 - 1.0 K/uL   Eosinophils Relative 3 %   Eosinophils Absolute 0.2 0 - 0.7 K/uL   Basophils Relative 1 %   Basophils Absolute 0.0 0 - 0.1 K/uL  Troponin I  Result Value Ref Range   Troponin I <0.03 <0.03 ng/mL  Ammonia  Result Value Ref Range   Ammonia 19 9 - 35 umol/L  TSH  Result Value Ref Range   TSH 2.324 0.350 - 4.500 uIU/mL  T4, free  Result Value Ref Range   Free T4 0.64 (L) 0.82 - 1.77 ng/dL   Ct Head Wo Contrast  Result Date: 04/25/2018 CLINICAL DATA:  Unresponsive. EXAM: CT HEAD  WITHOUT CONTRAST TECHNIQUE: Contiguous axial images were obtained from the base of the skull through the vertex without intravenous contrast. COMPARISON:  03/27/2018 FINDINGS: Brain: Stable age related cerebral atrophy, ventriculomegaly and periventricular white matter disease. No extra-axial fluid collections are identified. No CT findings for acute hemispheric infarction or intracranial hemorrhage. No mass lesions. The brainstem and cerebellum are unremarkable. 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 Vascular: No hyperdense vessel or unexpected calcification. Skull: Normal. Negative for fracture or focal lesion. Sinuses/Orbits: The paranasal sinuses and mastoid air cells are clear. Remote blow in type fracture noted involving the right orbit. Other: No scalp lesions or hematoma. IMPRESSION: Stable age related cerebral atrophy, ventriculomegaly and periventricular white matter disease. No acute intracranial findings or skull fracture. Electronically Signed   By: Rudie Meyer M.D.   On: 04/25/2018 19:08   Ct Head Wo Contrast  Result Date: 03/27/2018 CLINICAL DATA:  77 year old male post fall. Denies hitting head. History of dementia. Initial encounter. EXAM: CT HEAD WITHOUT CONTRAST CT CERVICAL SPINE WITHOUT CONTRAST TECHNIQUE: Multidetector CT imaging of the head and cervical spine was performed following the standard protocol without intravenous contrast. Multiplanar CT image reconstructions of the cervical spine were also generated. COMPARISON:  03/26/2018 MR brain and head CT. FINDINGS: CT HEAD FINDINGS Brain: No intracranial hemorrhage or CT evidence of large acute infarct. Chronic microvascular changes. Atrophy. No intracranial mass lesion noted on this unenhanced exam. Vascular: Vascular calcifications. Skull: No skull fracture. Sinuses/Orbits: No acute orbital abnormality. Remote right medial orbital wall fracture. Visualized paranasal sinuses clear. Other: Mastoid air cells and middle  ear cavities are clear. CT CERVICAL SPINE FINDINGS Alignment: Normal alignment. Skull base and vertebrae: No cervical spine fracture. Congenital nonunion posterior C1 ring. Soft tissues and spinal canal: No abnormal prevertebral soft tissue swelling. Disc levels: Cervical spondylotic changes with various degrees spinal stenosis and foraminal narrowing C3-4 through C6-7. Upper chest: No worrisome abnormality. Other: Carotid bifurcation calcification with carotid arteries deviated medially. IMPRESSION: No skull fracture or intracranial hemorrhage. No cervical spine fracture, malalignment or abnormal prevertebral soft tissue swelling. Chronic changes as noted above. Electronically Signed   By: Lacy Duverney M.D.   On: 03/27/2018  07:01   Ct Cervical Spine Wo Contrast  Result Date: 03/27/2018 CLINICAL DATA:  77 year old male post fall. Denies hitting head. History of dementia. Initial encounter. EXAM: CT HEAD WITHOUT CONTRAST CT CERVICAL SPINE WITHOUT CONTRAST TECHNIQUE: Multidetector CT imaging of the head and cervical spine was performed following the standard protocol without intravenous contrast. Multiplanar CT image reconstructions of the cervical spine were also generated. COMPARISON:  03/26/2018 MR brain and head CT. FINDINGS: CT HEAD FINDINGS Brain: No intracranial hemorrhage or CT evidence of large acute infarct. Chronic microvascular changes. Atrophy. No intracranial mass lesion noted on this unenhanced exam. Vascular: Vascular calcifications. Skull: No skull fracture. Sinuses/Orbits: No acute orbital abnormality. Remote right medial orbital wall fracture. Visualized paranasal sinuses clear. Other: Mastoid air cells and middle ear cavities are clear. CT CERVICAL SPINE FINDINGS Alignment: Normal alignment. Skull base and vertebrae: No cervical spine fracture. Congenital nonunion posterior C1 ring. Soft tissues and spinal canal: No abnormal prevertebral soft tissue swelling. Disc levels: Cervical spondylotic  changes with various degrees spinal stenosis and foraminal narrowing C3-4 through C6-7. Upper chest: No worrisome abnormality. Other: Carotid bifurcation calcification with carotid arteries deviated medially. IMPRESSION: No skull fracture or intracranial hemorrhage. No cervical spine fracture, malalignment or abnormal prevertebral soft tissue swelling. Chronic changes as noted above. Electronically Signed   By: Lacy DuverneySteven  Olson M.D.   On: 03/27/2018 07:01   Dg Chest Port 1 View  Result Date: 03/27/2018 CLINICAL DATA:  Status post unwitnessed fall, with concern for chest injury. Initial encounter. EXAM: PORTABLE CHEST 1 VIEW COMPARISON:  None. FINDINGS: The lungs are hypoexpanded. Mild bibasilar airspace opacities may reflect atelectasis or possibly pneumonia. There is no evidence of pleural effusion or pneumothorax. The cardiomediastinal silhouette is within normal limits. No acute osseous abnormalities are seen. IMPRESSION: Lungs hypoexpanded. Mild bibasilar airspace opacities may reflect atelectasis or possibly pneumonia. No displaced rib fracture seen. Electronically Signed   By: Roanna RaiderJeffery  Chang M.D.   On: 03/27/2018 06:50

## 2018-04-25 NOTE — ED Notes (Signed)
Patient transported to CT 

## 2018-05-01 DIAGNOSIS — F5109 Other insomnia not due to a substance or known physiological condition: Secondary | ICD-10-CM | POA: Diagnosis not present

## 2018-05-01 DIAGNOSIS — G309 Alzheimer's disease, unspecified: Secondary | ICD-10-CM | POA: Diagnosis not present

## 2018-05-01 DIAGNOSIS — I10 Essential (primary) hypertension: Secondary | ICD-10-CM | POA: Diagnosis not present

## 2018-05-11 DIAGNOSIS — B351 Tinea unguium: Secondary | ICD-10-CM | POA: Diagnosis not present

## 2018-05-11 DIAGNOSIS — R269 Unspecified abnormalities of gait and mobility: Secondary | ICD-10-CM | POA: Diagnosis not present

## 2018-05-11 DIAGNOSIS — M79676 Pain in unspecified toe(s): Secondary | ICD-10-CM | POA: Diagnosis not present

## 2018-05-11 DIAGNOSIS — E559 Vitamin D deficiency, unspecified: Secondary | ICD-10-CM | POA: Diagnosis not present

## 2018-05-17 DIAGNOSIS — R2689 Other abnormalities of gait and mobility: Secondary | ICD-10-CM | POA: Diagnosis not present

## 2018-05-17 DIAGNOSIS — I10 Essential (primary) hypertension: Secondary | ICD-10-CM | POA: Diagnosis not present

## 2018-05-17 DIAGNOSIS — Z9181 History of falling: Secondary | ICD-10-CM | POA: Diagnosis not present

## 2018-05-17 DIAGNOSIS — Z87440 Personal history of urinary (tract) infections: Secondary | ICD-10-CM | POA: Diagnosis not present

## 2018-05-17 DIAGNOSIS — E559 Vitamin D deficiency, unspecified: Secondary | ICD-10-CM | POA: Diagnosis not present

## 2018-05-24 DIAGNOSIS — R2689 Other abnormalities of gait and mobility: Secondary | ICD-10-CM | POA: Diagnosis not present

## 2018-05-24 DIAGNOSIS — Z9181 History of falling: Secondary | ICD-10-CM | POA: Diagnosis not present

## 2018-05-24 DIAGNOSIS — Z87440 Personal history of urinary (tract) infections: Secondary | ICD-10-CM | POA: Diagnosis not present

## 2018-05-24 DIAGNOSIS — E559 Vitamin D deficiency, unspecified: Secondary | ICD-10-CM | POA: Diagnosis not present

## 2018-05-24 DIAGNOSIS — I10 Essential (primary) hypertension: Secondary | ICD-10-CM | POA: Diagnosis not present

## 2018-05-25 DIAGNOSIS — I10 Essential (primary) hypertension: Secondary | ICD-10-CM | POA: Diagnosis not present

## 2018-05-25 DIAGNOSIS — R269 Unspecified abnormalities of gait and mobility: Secondary | ICD-10-CM | POA: Diagnosis not present

## 2018-05-26 DIAGNOSIS — E559 Vitamin D deficiency, unspecified: Secondary | ICD-10-CM | POA: Diagnosis not present

## 2018-05-26 DIAGNOSIS — I10 Essential (primary) hypertension: Secondary | ICD-10-CM | POA: Diagnosis not present

## 2018-05-26 DIAGNOSIS — Z9181 History of falling: Secondary | ICD-10-CM | POA: Diagnosis not present

## 2018-05-26 DIAGNOSIS — Z87440 Personal history of urinary (tract) infections: Secondary | ICD-10-CM | POA: Diagnosis not present

## 2018-05-26 DIAGNOSIS — R2689 Other abnormalities of gait and mobility: Secondary | ICD-10-CM | POA: Diagnosis not present

## 2018-05-29 DIAGNOSIS — Z87440 Personal history of urinary (tract) infections: Secondary | ICD-10-CM | POA: Diagnosis not present

## 2018-05-29 DIAGNOSIS — E559 Vitamin D deficiency, unspecified: Secondary | ICD-10-CM | POA: Diagnosis not present

## 2018-05-29 DIAGNOSIS — R2689 Other abnormalities of gait and mobility: Secondary | ICD-10-CM | POA: Diagnosis not present

## 2018-05-29 DIAGNOSIS — Z9181 History of falling: Secondary | ICD-10-CM | POA: Diagnosis not present

## 2018-05-29 DIAGNOSIS — I10 Essential (primary) hypertension: Secondary | ICD-10-CM | POA: Diagnosis not present

## 2018-05-31 DIAGNOSIS — Z87440 Personal history of urinary (tract) infections: Secondary | ICD-10-CM | POA: Diagnosis not present

## 2018-05-31 DIAGNOSIS — R2689 Other abnormalities of gait and mobility: Secondary | ICD-10-CM | POA: Diagnosis not present

## 2018-05-31 DIAGNOSIS — I10 Essential (primary) hypertension: Secondary | ICD-10-CM | POA: Diagnosis not present

## 2018-05-31 DIAGNOSIS — E559 Vitamin D deficiency, unspecified: Secondary | ICD-10-CM | POA: Diagnosis not present

## 2018-05-31 DIAGNOSIS — Z9181 History of falling: Secondary | ICD-10-CM | POA: Diagnosis not present

## 2018-06-05 ENCOUNTER — Inpatient Hospital Stay
Admission: EM | Admit: 2018-06-05 | Discharge: 2018-06-08 | DRG: 690 | Disposition: A | Discharge status: Nursing Home (Includes ICF) | Payer: Medicare Other | Attending: Internal Medicine | Admitting: Internal Medicine

## 2018-06-05 ENCOUNTER — Encounter: Payer: Self-pay | Admitting: Emergency Medicine

## 2018-06-05 ENCOUNTER — Other Ambulatory Visit: Payer: Self-pay

## 2018-06-05 ENCOUNTER — Emergency Department: Payer: Medicare Other

## 2018-06-05 DIAGNOSIS — F0280 Dementia in other diseases classified elsewhere without behavioral disturbance: Secondary | ICD-10-CM | POA: Diagnosis present

## 2018-06-05 DIAGNOSIS — Z79899 Other long term (current) drug therapy: Secondary | ICD-10-CM

## 2018-06-05 DIAGNOSIS — G309 Alzheimer's disease, unspecified: Secondary | ICD-10-CM | POA: Diagnosis not present

## 2018-06-05 DIAGNOSIS — S0990XA Unspecified injury of head, initial encounter: Secondary | ICD-10-CM | POA: Diagnosis not present

## 2018-06-05 DIAGNOSIS — N390 Urinary tract infection, site not specified: Secondary | ICD-10-CM | POA: Diagnosis not present

## 2018-06-05 DIAGNOSIS — I10 Essential (primary) hypertension: Secondary | ICD-10-CM | POA: Diagnosis present

## 2018-06-05 DIAGNOSIS — Z87891 Personal history of nicotine dependence: Secondary | ICD-10-CM | POA: Diagnosis not present

## 2018-06-05 DIAGNOSIS — J9811 Atelectasis: Secondary | ICD-10-CM | POA: Diagnosis not present

## 2018-06-05 DIAGNOSIS — W19XXXA Unspecified fall, initial encounter: Secondary | ICD-10-CM | POA: Diagnosis present

## 2018-06-05 DIAGNOSIS — S0011XA Contusion of right eyelid and periocular area, initial encounter: Secondary | ICD-10-CM | POA: Diagnosis present

## 2018-06-05 DIAGNOSIS — E119 Type 2 diabetes mellitus without complications: Secondary | ICD-10-CM | POA: Diagnosis present

## 2018-06-05 DIAGNOSIS — R4182 Altered mental status, unspecified: Secondary | ICD-10-CM | POA: Diagnosis present

## 2018-06-05 HISTORY — DX: Urinary tract infection, site not specified: N39.0

## 2018-06-05 LAB — BASIC METABOLIC PANEL WITH GFR
Anion gap: 7 (ref 5–15)
BUN: 13 mg/dL (ref 8–23)
CO2: 27 mmol/L (ref 22–32)
Calcium: 8.8 mg/dL — ABNORMAL LOW (ref 8.9–10.3)
Chloride: 104 mmol/L (ref 98–111)
Creatinine, Ser: 0.96 mg/dL (ref 0.61–1.24)
GFR calc Af Amer: >60 mL/min
GFR calc non Af Amer: >60 mL/min
Glucose, Bld: 235 mg/dL — ABNORMAL HIGH (ref 70–99)
Potassium: 4.6 mmol/L (ref 3.5–5.1)
Sodium: 138 mmol/L (ref 135–145)

## 2018-06-05 LAB — URINALYSIS, COMPLETE (UACMP) WITH MICROSCOPIC
Bacteria, UA: NONE SEEN
Bilirubin Urine: NEGATIVE
Glucose, UA: >=500 mg/dL — AB
Ketones, ur: 20 mg/dL — AB
Nitrite: NEGATIVE
Protein, ur: NEGATIVE mg/dL
Specific Gravity, Urine: 1.026 (ref 1.005–1.030)
pH: 6 (ref 5.0–8.0)

## 2018-06-05 LAB — CBC WITH DIFFERENTIAL/PLATELET
BASOS PCT: 1 %
Basophils Absolute: 0.1 10*3/uL (ref 0–0.1)
Eosinophils Absolute: 0 10*3/uL (ref 0–0.7)
Eosinophils Relative: 0 %
HEMATOCRIT: 38.6 % — AB (ref 40.0–52.0)
HEMOGLOBIN: 12.6 g/dL — AB (ref 13.0–18.0)
Lymphocytes Relative: 6 %
Lymphs Abs: 0.7 10*3/uL — ABNORMAL LOW (ref 1.0–3.6)
MCH: 26 pg (ref 26.0–34.0)
MCHC: 32.5 g/dL (ref 32.0–36.0)
MCV: 80.1 fL (ref 80.0–100.0)
Monocytes Absolute: 1 10*3/uL (ref 0.2–1.0)
Monocytes Relative: 9 %
NEUTROS ABS: 9.1 10*3/uL — AB (ref 1.4–6.5)
Neutrophils Relative %: 84 %
Platelets: 247 10*3/uL (ref 150–440)
RBC: 4.82 MIL/uL (ref 4.40–5.90)
RDW: 19.4 % — AB (ref 11.5–14.5)
WBC: 10.9 10*3/uL — AB (ref 3.8–10.6)

## 2018-06-05 LAB — TROPONIN I: Troponin I: <0.03 ng/mL (ref <0.03)

## 2018-06-05 LAB — MRSA PCR SCREENING: MRSA by PCR: NEGATIVE

## 2018-06-05 MED ORDER — TRAZODONE HCL 50 MG PO TABS: 50.0000 mg | ORAL_TABLET | Freq: Every evening | ORAL | Status: DC | PRN

## 2018-06-05 MED ORDER — MEMANTINE HCL 5 MG PO TABS
5.0000 mg | ORAL_TABLET | Freq: Two times a day (BID) | ORAL | Status: DC
  Administered 2018-06-05 – 2018-06-08 (×7): 5 mg via ORAL
  Filled 2018-06-05 (×7): qty 1

## 2018-06-05 MED ORDER — ESCITALOPRAM OXALATE 10 MG PO TABS
10.0000 mg | ORAL_TABLET | Freq: Every day | ORAL | Status: DC
  Administered 2018-06-05 – 2018-06-08 (×4): 10 mg via ORAL
  Filled 2018-06-05 (×4): qty 1

## 2018-06-05 MED ORDER — SODIUM CHLORIDE 0.9 % IV SOLN
1.0000 g | Freq: Once | INTRAVENOUS | Status: AC
  Administered 2018-06-05: 1 g via INTRAVENOUS
  Filled 2018-06-05: qty 10

## 2018-06-05 MED ORDER — OLANZAPINE 5 MG PO TBDP
5.0000 mg | ORAL_TABLET | Freq: Every day | ORAL | Status: DC
  Administered 2018-06-05 – 2018-06-07 (×3): 5 mg via ORAL
  Filled 2018-06-05 (×4): qty 1

## 2018-06-05 MED ORDER — DIVALPROEX SODIUM 125 MG PO CSDR
125.0000 mg | DELAYED_RELEASE_CAPSULE | Freq: Two times a day (BID) | ORAL | Status: DC
  Administered 2018-06-05 – 2018-06-08 (×7): 125 mg via ORAL
  Filled 2018-06-05 (×8): qty 1

## 2018-06-05 MED ORDER — OLANZAPINE 2.5 MG PO TABS
2.5000 mg | ORAL_TABLET | Freq: Two times a day (BID) | ORAL | Status: DC | PRN
  Filled 2018-06-05: qty 1

## 2018-06-05 MED ORDER — ONDANSETRON HCL 4 MG PO TABS: 4.0000 mg | ORAL_TABLET | Freq: Four times a day (QID) | ORAL | Status: DC | PRN

## 2018-06-05 MED ORDER — CEFTRIAXONE SODIUM 1 G IJ SOLR
1.0000 g | INTRAMUSCULAR | Status: DC
  Administered 2018-06-06 – 2018-06-07 (×2): 1 g via INTRAVENOUS
  Filled 2018-06-05: qty 1
  Filled 2018-06-05: qty 10
  Filled 2018-06-05: qty 1

## 2018-06-05 MED ORDER — SODIUM CHLORIDE 0.9 % IV SOLN
INTRAVENOUS | Status: DC
  Administered 2018-06-05 – 2018-06-06 (×2): via INTRAVENOUS

## 2018-06-05 MED ORDER — SENNOSIDES-DOCUSATE SODIUM 8.6-50 MG PO TABS: 1.0000 | ORAL_TABLET | Freq: Every evening | ORAL | Status: DC | PRN

## 2018-06-05 MED ORDER — DONEPEZIL HCL 5 MG PO TABS
10.0000 mg | ORAL_TABLET | Freq: Every day | ORAL | Status: DC
  Administered 2018-06-05 – 2018-06-08 (×4): 10 mg via ORAL
  Filled 2018-06-05 (×5): qty 2

## 2018-06-05 MED ORDER — ACETAMINOPHEN 325 MG PO TABS: 650.0000 mg | ORAL_TABLET | Freq: Four times a day (QID) | ORAL | Status: DC | PRN

## 2018-06-05 MED ORDER — ENOXAPARIN SODIUM 40 MG/0.4ML ~~LOC~~ SOLN
40.0000 mg | SUBCUTANEOUS | Status: DC
  Administered 2018-06-05 – 2018-06-07 (×3): 40 mg via SUBCUTANEOUS
  Filled 2018-06-05 (×3): qty 0.4

## 2018-06-05 MED ORDER — ACETAMINOPHEN 650 MG RE SUPP: 650.0000 mg | Freq: Four times a day (QID) | RECTAL | Status: DC | PRN

## 2018-06-05 MED ORDER — ONDANSETRON HCL 4 MG/2ML IJ SOLN: 4.0000 mg | Freq: Four times a day (QID) | INTRAMUSCULAR | Status: DC | PRN

## 2018-06-05 NOTE — ED Notes (Signed)
Dr. Goodman at bedside.  

## 2018-06-05 NOTE — H&P (Signed)
[facility] - [place] at [facility]   PATIENT NAME: Dennis Sandoval    MR#:  [id number]  DATE OF BIRTH:  May 24, 1941  DATE OF ADMISSION:  06/05/2018  PRIMARY CARE PHYSICIAN: [facility]   REQUESTING/REFERRING PHYSICIAN:   CHIEF COMPLAINT:   Chief Complaint  Patient presents with  . Fall    HISTORY OF PRESENT ILLNESS: Dennis Sandoval  is a 77 y.o. male with a known history of advanced Alzheimer's dementia diabetes mellitus type 2, hypertension, history of urinary tract infection was found at bedside at the assisted living facility.  Patient has a small contusion over the right eyebrow.  He is lethargic with decreased responsiveness.  Patient was referred to the emergency room and he was evaluated and has urinary tract infection.  Patient is more responsive in the emergency room opens eyes for verbal commands but not completely oriented secondary to dementia.  Moves all extremities.  Hospitalist service was consulted for further care.  PAST MEDICAL HISTORY:   Past Medical History:  Diagnosis Date  . Alzheimer disease 06/16/2015  . Dementia   . Diabetes mellitus without complication (HCC)   . Hypertension   . UTI (urinary tract infection)     PAST SURGICAL HISTORY:  Past Surgical History:  Procedure Laterality Date  . PROSTATE SURGERY      SOCIAL HISTORY:  Social History   Tobacco Use  . Smoking status: Former Smoker    Packs/day: 0.50    Years: 50.00    Pack years: 25.00    Types: Cigarettes    Last attempt to quit: 11/27/1985    Years since quitting: 32.5  . Smokeless tobacco: Never Used  Substance Use Topics  . Alcohol use: No    FAMILY HISTORY:  Family History  Problem Relation Age of Onset  . Dementia Mother   . Dementia Father   . Cancer Brother        not sure what type  . Alcohol abuse Brother   . Heart disease Neg Hx   . Diabetes Neg Hx   . Stroke Neg Hx     DRUG ALLERGIES: No Known Allergies  REVIEW OF  SYSTEMS:  Could not be completely obtain secondary to advanced dementia and lethargy  MEDICATIONS AT HOME:  Prior to Admission medications   Medication Sig Start Date End Date Taking? Authorizing Provider  divalproex (DEPAKOTE SPRINKLE) 125 MG capsule Take 125 mg by mouth every 12 (twelve) hours.    Yes [provider]  donepezil (ARICEPT) 10 MG tablet Take 1 tablet (10 mg total) by mouth at bedtime. Patient taking differently: Take 10 mg by mouth daily.  01/14/16  Yes Lada, Janit Bern, MD  escitalopram (LEXAPRO) 10 MG tablet Take 0.5 tablets (5 mg total) by mouth daily. (for mood) Patient taking differently: Take 10 mg by mouth daily.  01/14/16  Yes Lada, Janit Bern, MD  memantine (NAMENDA) 5 MG tablet Take 1 tablet (5 mg total) by mouth 2 (two) times daily. 04/14/16  Yes Lada, Janit Bern, MD  OLANZapine (ZYPREXA) 2.5 MG tablet Take 2.5 mg by mouth 2 (two) times daily as needed (agitation).    Yes [provider]  OLANZapine zydis (ZYPREXA) 5 MG disintegrating tablet Take 5 mg by mouth at bedtime.   Yes [provider]  traZODone (DESYREL) 50 MG tablet Take 1 tablet (50 mg total) by mouth at bedtime as needed for sleep. Patient taking differently: Take 50 mg by mouth at bedtime.  03/29/18  Yes Enedina FinnerPatel, Sona, MD      PHYSICAL EXAMINATION:   VITAL SIGNS: Blood pressure 123/68, pulse 83, temperature 98.1 F (36.7 C), temperature source Oral, resp. rate 10, height 5\' 10"  (1.778 m), weight 77.8 kg (171 lb 8.3 oz), SpO2 97 %.  GENERAL:  77 y.o.-year-old patient lying in the bed with no acute distress.  EYES: Pupils equal, round, reactive to light and accommodation. No scleral icterus. Extraocular muscles intact.  HEENT: Head atraumatic, normocephalic. Oropharynx and nasopharynx clear. Small contusion over right eye brow  NECK:  Supple, no jugular venous distention. No thyroid enlargement, no tenderness.  LUNGS: Normal breath sounds bilaterally, no wheezing, rales,rhonchi or  crepitation. No use of accessory muscles of respiration.  CARDIOVASCULAR: S1, S2 normal. No murmurs, rubs, or gallops.  ABDOMEN: Soft, nontender, nondistended. Bowel sounds present. No organomegaly or mass.  EXTREMITIES: No pedal edema, cyanosis, or clubbing.  NEUROLOGIC: Cranial nerves II through XII are intact. Muscle strength 5/5 in all extremities. Sensation intact. Gait not checked.  Demented PSYCHIATRIC: could not be assessed SKIN: No obvious rash, lesion, or ulcer.   LABORATORY PANEL:   CBC Recent Labs  Lab 06/05/18 0901  WBC 10.9*  HGB 12.6*  HCT 38.6*  PLT 247  MCV 80.1  MCH 26.0  MCHC 32.5  RDW 19.4*  LYMPHSABS 0.7*  MONOABS 1.0  EOSABS 0.0  BASOSABS 0.1   ------------------------------------------------------------------------------------------------------------------  Chemistries  Recent Labs  Lab 06/05/18 0901  NA 138  K 4.6  CL 104  CO2 27  GLUCOSE 235*  BUN 13  CREATININE 0.96  CALCIUM 8.8*   ------------------------------------------------------------------------------------------------------------------ estimated creatinine clearance is 65.5 mL/min (by C-G formula based on SCr of 0.96 mg/dL). ------------------------------------------------------------------------------------------------------------------ No results for input(s): TSH, T4TOTAL, T3FREE, THYROIDAB in the last 72 hours.  Invalid input(s): FREET3   Coagulation profile No results for input(s): INR, PROTIME in the last 168 hours. ------------------------------------------------------------------------------------------------------------------- No results for input(s): DDIMER in the last 72 hours. -------------------------------------------------------------------------------------------------------------------  Cardiac Enzymes Recent Labs  Lab 06/05/18 0901  TROPONINI <0.03    ------------------------------------------------------------------------------------------------------------------ Invalid input(s): POCBNP  ---------------------------------------------------------------------------------------------------------------  Urinalysis    Component Value Date/Time   COLORURINE YELLOW (A) 06/05/2018 1023   APPEARANCEUR CLEAR (A) 06/05/2018 1023   LABSPEC 1.026 06/05/2018 1023   PHURINE 6.0 06/05/2018 1023   GLUCOSEU >=500 (A) 06/05/2018 1023   HGBUR SMALL (A) 06/05/2018 1023   BILIRUBINUR NEGATIVE 06/05/2018 1023   KETONESUR 20 (A) 06/05/2018 1023   PROTEINUR NEGATIVE 06/05/2018 1023   NITRITE NEGATIVE 06/05/2018 1023   LEUKOCYTESUR TRACE (A) 06/05/2018 1023     RADIOLOGY: Ct Head Wo Contrast  Result Date: 06/05/2018 CLINICAL DATA:  77 year old male found by bed this morning. Dementia. Initial encounter. EXAM: CT HEAD WITHOUT CONTRAST TECHNIQUE: Contiguous axial images were obtained from the base of the skull through the vertex without intravenous contrast. COMPARISON:  04/25/2018 head CT. FINDINGS: Brain: No intracranial hemorrhage or CT evidence of large acute infarct. Remote small left cerebellar infarct. Moderate chronic microvascular changes. Moderate global atrophy. No intracranial mass lesion noted on this unenhanced exam. Vascular: Vascular calcifications Skull: No skull fracture Sinuses/Orbits: Exophthalmos. Remote right medial orbital wall fracture. Minimal mucosal thickening ethmoid sinus air cells. Other: Mastoid air cells and middle ear cavities are clear. IMPRESSION: No skull fracture or intracranial hemorrhage. No evidence of large acute infarct. Remote small left cerebellar infarct. Moderate chronic microvascular changes. Atrophy. Electronically Signed   By: Lacy DuverneySteven  Olson M.D.   On: 06/05/2018 07:46   Dg Chest Portable 1  View  Result Date: 06/05/2018 CLINICAL DATA:  Altered mental status. EXAM: PORTABLE CHEST 1 VIEW COMPARISON:  03/27/2018  FINDINGS: There is minimal atelectasis at the lung bases. No consolidative infiltrates or appreciable effusions. Heart size and vascularity are normal. No significant bone abnormality. IMPRESSION: Minimal bibasilar atelectasis, probably due to a shallow inspiration. Otherwise, negative exam. Electronically Signed   By: Francene Boyers M.D.   On: 06/05/2018 08:06    EKG: Orders placed or performed during the hospital encounter of 04/25/18  . EKG 12-Lead  . EKG 12-Lead    IMPRESSION AND PLAN:  77 year old male patient with history of dementia, diabetes mellitus type 2, hypertension presented to the emergency room for lethargy confusion and possible fall  -Altered mental status secondary to urinary tract infection Start patient on IV Rocephin antibiotic  -Urinary tract infection IV Rocephin antibiotic 1 g daily Follow-up urine cultures  -Type 2 diabetes mellitus Diabetic diet with sliding scale coverage with insulin  -DVT prophylaxis With subcu Lovenox daily   All the records are reviewed and case discussed with ED provider. Management plans discussed with the patient, family and they are in agreement.  CODE STATUS:Full code Code Status History    Date Active Date Inactive Code Status Order ID Comments User Context   03/27/2018 0842 03/29/2018 1955 Full Code [id number]  [name], MD Inpatient    Advance Directive Documentation     Most Recent Value  Type of Advance Directive  Living will [per paper work from nursing home]  Pre-existing out of facility DNR order (yellow form or pink MOST form)  -  "MOST" Form in Place?  -       TOTAL TIME TAKING CARE OF THIS PATIENT: 53 minutes.    Ihor Austin M.D on 06/05/2018 at 12:08 PM  Between 7am to 6pm - Pager - [phone]  After 6pm go to [email or web address] - password [email or web address]  [facility] [place] [facility]  Office  [phone]  CC: Primary care physician; [facility]

## 2018-06-05 NOTE — ED Provider Notes (Signed)
[facility] Emergency Department Provider Note  ____________________________________________   I have reviewed the triage vital signs and the nursing notes.   HISTORY  Chief Complaint Fall   History limited by and level 5 caveat due to: Dementia. History obtained from family   HPI Dennis Sandoval is a 77 y.o. male who presents to the emergency department today after a fall. Was found on the ground by his bed. Has history of dementia and cannot tell what happened. Family however states that they have noticed a change in behavior over the past couple of days. He has been less responsive than normal. Has also been complaining of generalized pain. They have not noticed any cough or fevers.   Per medical record review patient has a history of dementia, uti. DM.  Past Medical History:  Diagnosis Date  . Alzheimer disease 06/16/2015  . Dementia   . Diabetes mellitus without complication (HCC)   . Hypertension   . UTI (urinary tract infection)     Patient Active Problem List   Diagnosis Date Noted  . Sepsis (HCC) 03/27/2018  . 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 History of BPH 12/21/2016  . Medication monitoring encounter 01/28/2016  . Hyperglycemia 01/28/2016  . Preventative health care 12/21/2015  . Need for pneumococcal vaccination 12/21/2015  . Screening for AAA (abdominal aortic aneurysm) 12/01/2015  . Smoking history 11/28/2015  . Dementia 07/25/2015  . Benign hypertension 07/25/2015    Past Surgical History:  Procedure Laterality Date  . PROSTATE SURGERY      Prior to Admission medications   Medication Sig Start Date End Date Taking? Authorizing Provider  divalproex (DEPAKOTE SPRINKLE) 125 MG capsule Take 125 mg by mouth every 12 (twelve) hours.    Yes [provider]  donepezil (ARICEPT) 10 MG tablet Take 1 tablet (10 mg total) by mouth at bedtime. Patient taking differently: Take 10 mg  by mouth daily.  01/14/16  Yes Lada, Janit BernMelinda P, MD  escitalopram (LEXAPRO) 10 MG tablet Take 0.5 tablets (5 mg total) by mouth daily. (for mood) Patient taking differently: Take 10 mg by mouth daily.  01/14/16  Yes Lada, Janit BernMelinda P, MD  memantine (NAMENDA) 5 MG tablet Take 1 tablet (5 mg total) by mouth 2 (two) times daily. 04/14/16  Yes Lada, Janit BernMelinda P, MD  OLANZapine (ZYPREXA) 2.5 MG tablet Take 2.5 mg by mouth 2 (two) times daily as needed (agitation).    Yes [provider]  OLANZapine zydis (ZYPREXA) 5 MG disintegrating tablet Take 5 mg by mouth at bedtime.   Yes [provider]  traZODone (DESYREL) 50 MG tablet Take 1 tablet (50 mg total) by mouth at bedtime as needed for sleep. 03/29/18  Yes Enedina FinnerPatel, Sona, MD    Allergies Patient has no known allergies.  Family History  Problem Relation Age of Onset  . Dementia Mother   . Dementia Father   . Cancer Brother        not sure what type  . Alcohol abuse Brother   . Heart disease Neg Hx   . Diabetes Neg Hx   . Stroke Neg Hx     Social History Social History   Tobacco Use  . Smoking status: Former Smoker    Packs/day: 0.50    Years: 50.00    Pack years: 25.00    Types: Cigarettes    Last attempt to quit: 11/27/1985    Years since quitting: 32.5  . Smokeless tobacco: Never  Used  Substance Use Topics  . Alcohol use: No  . Drug use: No    Review of Systems Unable to obtain ROS secondary to dementia.  ____________________________________________   PHYSICAL EXAM:  VITAL SIGNS: ED Triage Vitals  Enc Vitals Group     BP 06/05/18 0621 126/79     Pulse Rate 06/05/18 0621 91     Resp 06/05/18 0621 17     Temp 06/05/18 0621 98.1 F (36.7 C)     Temp Source 06/05/18 0621 Oral     SpO2 06/05/18 0621 95 %     Weight 06/05/18 0622 171 lb 8.3 oz (77.8 kg)     Height 06/05/18 0622 5\' 10"  (1.778 m)     Head Circumference --      Peak Flow --      Pain Score 06/05/18 0622 0   Constitutional: Awake and alert.   Eyes: Conjunctivae are normal.  ENT      Head: Normocephalic and atraumatic.      Nose: No congestion/rhinnorhea.      Mouth/Throat: Mucous membranes are moist.      Neck: No stridor. Hematological/Lymphatic/Immunilogical: No cervical lymphadenopathy. Cardiovascular: Normal rate, regular rhythm.  No murmurs, rubs, or gallops.  Respiratory: Normal respiratory effort without tachypnea nor retractions. Breath sounds are clear and equal bilaterally. No wheezes/rales/rhonchi. Gastrointestinal: Soft and non tender. No rebound. No guarding.  Genitourinary: Deferred Musculoskeletal: Normal range of motion in all extremities. No lower extremity edema. Neurologic:  Dementia.  Skin:  Skin is warm, dry and intact. No rash noted. ____________________________________________    LABS (pertinent positives/negatives)  UA wbc 6-10, trace leukocytes BMP wnl except glu 235, ca 8.8 CBC wbc 10.9, hgb 12.6, plt 247 Trop <0.03  ____________________________________________   EKG  None  ____________________________________________    RADIOLOGY  CT head No fracture or hemorrhage  CXR Atelectasis  ____________________________________________   PROCEDURES  Procedures  ____________________________________________   INITIAL IMPRESSION / ASSESSMENT AND PLAN / ED COURSE  Pertinent labs & imaging results that were available during my care of the patient were reviewed by me and considered in my medical decision making (see chart for details).   Patient presented to the emergency department today after a fall.  This was unwitnessed.  Head CT was negative and no other obvious acute injury on exam.  Family however stated the patient had been getting worse over the past few days.  Work-up was performed and is concerning for possible urinary tract infection.  Patient has had urinary tract infections in the past.  Will start on Rocephin.  Will plan on  admission.  ____________________________________________   FINAL CLINICAL IMPRESSION(S) / ED DIAGNOSES  Final diagnoses:  Lower urinary tract infectious disease  Altered mental status, unspecified altered mental status type  Fall, initial encounter     Note: This dictation was prepared with Office manager. Any transcriptional errors that result from this process are unintentional     Phineas Semen, MD 06/05/18 1150

## 2018-06-05 NOTE — NC FL2 (Addendum)
[place] MEDICAID FL2 LEVEL OF CARE SCREENING TOOL     IDENTIFICATION  Patient Name: Dennis Sandoval Birthdate: 03/30/1941 Sex: male Admission Date (Current Location): 06/05/2018  [facility] [place]      Facility and Address:  [facility], [address], [place], [place] [address]      Provider Number: [id number]  Attending Physician Name and Address:  Ihor AustinPyreddy, Pavan, MD  Relative Name and Phone Number:  Dennis Sandoval [phone]    Current Level of Care: Hospital Recommended Level of Care: Memory Care Prior Approval Number:    Date Approved/Denied:   PASRR Number:    Discharge Plan: Other (Comment)(Memory Care )    Current Diagnoses: Patient Active Problem List   Diagnosis Date Noted  . UTI (urinary tract infection) 06/05/2018  . Sepsis (HCC) 03/27/2018  . 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 History of BPH 12/21/2016  . Medication monitoring encounter 01/28/2016  . Hyperglycemia 01/28/2016  . Preventative health care 12/21/2015  . Need for pneumococcal vaccination 12/21/2015  . Screening for AAA (abdominal aortic aneurysm) 12/01/2015  . Smoking history 11/28/2015  . Dementia 07/25/2015  . Benign hypertension 07/25/2015    Orientation RESPIRATION BLADDER Height & Weight     Self  Normal Incontinent Weight: 171 lb 8.3 oz (77.8 kg) Height:  5\' 10"  (177.8 cm)  BEHAVIORAL SYMPTOMS/MOOD NEUROLOGICAL BOWEL NUTRITION STATUS  (none) (None) Incontinent Diet(Heart Healthy )  AMBULATORY STATUS COMMUNICATION OF NEEDS Skin   Limited Assist Verbally Other (Comment)(Pressure injury stage 1 on right heel )                       Personal Care Assistance Level of Assistance  Bathing, Feeding, Dressing Bathing Assistance: Limited assistance Feeding assistance: Independent Dressing Assistance: Limited assistance     Functional Limitations Info  Sight, Hearing, Speech Sight  Info: Adequate Hearing Info: Adequate Speech Info: Adequate    SPECIAL CARE FACTORS FREQUENCY                       Contractures Contractures Info: Not present    Additional Factors Info  Code Status, Allergies Code Status Info: Full Code  Allergies Info: NKA           Medication List    TAKE these medications   cephALEXin 250 MG capsule Commonly known as:  KEFLEX Take 1 capsule (250 mg total) by mouth 3 (three) times daily for 3 days.   divalproex 125 MG capsule Commonly known as:  DEPAKOTE SPRINKLE Take 125 mg by mouth every 12 (twelve) hours.   donepezil 10 MG tablet Commonly known as:  ARICEPT Take 1 tablet (10 mg total) by mouth at bedtime. What changed:  when to take this   escitalopram 10 MG tablet Commonly known as:  LEXAPRO Take 0.5 tablets (5 mg total) by mouth daily. (for mood) What changed:    how much to take  additional instructions   memantine 5 MG tablet Commonly known as:  NAMENDA Take 1 tablet (5 mg total) by mouth 2 (two) times daily.   metFORMIN 500 MG tablet Commonly known as:  GLUCOPHAGE Take 1 tablet (500 mg total) by mouth 2 (two) times daily with a meal.   OLANZapine zydis 5 MG disintegrating tablet Commonly known as:  ZYPREXA Take 5 mg by mouth at bedtime.   OLANZapine 2.5 MG tablet Commonly known as:  ZYPREXA Take 2.5 mg by  mouth 2 (two) times daily as needed (agitation).   traZODone 50 MG tablet Commonly known as:  DESYREL Take 1 tablet (50 mg total) by mouth at bedtime as needed for sleep. What changed:  when to take this       Discharge Medications: Please see discharge summary for a list of discharge medications.  Relevant Imaging Results:  Relevant Lab Results:   Additional Information    Dennis Sandoval  Rinaldo Ratel, [address]

## 2018-06-05 NOTE — Progress Notes (Signed)
Advanced care plan. Purpose of the Encounter: CODE STATUS Parties in Attendance: Patient and patient's son Patient's Decision Capacity: Not great Subjective/Patient's story: Presented to the emergency room from assisted living facility for lethargy and decreased responsiveness Objective/Medical story Patient has urinary tract infection needs IV antibiotics Goals of care determination:  Advance care directives and goals of care discussed with patient's son in detail For now patient's family want everything done which includes CPR, intubation and ventilator if the need arises CODE STATUS: Full code Time spent discussing advanced care planning: 16 minutes

## 2018-06-05 NOTE — ED Notes (Addendum)
Second visitor at bedside; visitors state pt lives with his wife at the nursing home; she has recently been diagnosed with pneumonia; son says when he was there yesterday his father seemed weak and had c/o hurting all over; son concerned pt has pneumonia as well; pt has been afebrile since arrival; no cough has been noted;

## 2018-06-05 NOTE — ED Notes (Signed)
Pt's family reports that pt is lethargic, and this is not his norm. Pt responds to questions, but eyes remain closed when he answers. Pt experiencing some tremors as well. Pt given warm blanket.

## 2018-06-05 NOTE — Care Management Obs Status (Addendum)
MEDICARE OBSERVATION STATUS NOTIFICATION   Patient Details  Name: Dennis Sandoval MRN: [id number] Date of Birth: 10/27/1941   Medicare Observation Status Notification Given:    yes Son, Coral CeoDarrell   Kaye Luoma S Wilkie Zenon, RN 06/05/2018, 3:13 PM

## 2018-06-05 NOTE — Progress Notes (Signed)
[facility] Per facility, patient takes pills whole. Patient is able to feed themselves and prefers finger foods. Patient is oriented to person only and incontinent at baseline. No family present at this time. Bo McclintockBrewer,Kamauri Denardo S, RN

## 2018-06-05 NOTE — Clinical Social Work Note (Signed)
Clinical Social Work Assessment  Patient Details  Name: Dennis Sandoval MRN: [id number] Date of Birth: 1941-07-13  Date of referral:  06/05/18               Reason for consult:  Facility Placement                Permission sought to share information with:  Case Manager, Customer service manager, Family Supports Permission granted to share information::  Yes, Verbal Permission Granted  Name::        Agency::     Relationship::     Contact Information:     Housing/Transportation Living arrangements for the past 2 months:  [place] of Information:  Adult Children Patient Interpreter Needed:  None Criminal Activity/Legal Involvement Pertinent to Current Situation/Hospitalization:  No - Comment as needed Significant Relationships:  Adult Children, Spouse Lives with:  Facility Resident Do you feel safe going back to the place where you live?  Yes Need for family participation in patient care:  Yes (Comment)  Care giving concerns:  Patient is a long term resident at [place] Worker assessment / plan:  CSW noted in chart that patient is from [facility] CSW met with patient and son Dennis Sandoval at bedside. CSW introduced self and explained role. Son states that patient lives with his wife at [facility]. Son states that they would like patient to return to [facility] at discharge. CSW will follow for discharge planning.   Employment status:  Retired Nurse, adult PT Recommendations:  Not assessed at this time Information / Referral to community resources:     Patient/Family's Response to care:  Patient quiet during interview   Patient/Family's Understanding of and Emotional Response to Diagnosis, Current Treatment, and Prognosis:  Son thanked CSW for assistance   Emotional Assessment Appearance:  Appears stated age Attitude/Demeanor/Rapport:    Affect (typically  observed):  Pleasant, Quiet Orientation:  Oriented to Self, Oriented to Place Alcohol / Substance use:  Not Applicable Psych involvement (Current and /or in the community):  No (Comment)  Discharge Needs  Concerns to be addressed:  No discharge needs identified Readmission within the last 30 days:  No Current discharge risk:  None Barriers to Discharge:  Continued Medical Work up   [employer], [place] 06/05/2018, 1:59 PM

## 2018-06-05 NOTE — ED Notes (Signed)
Son at bedside.

## 2018-06-05 NOTE — ED Triage Notes (Signed)
Pt [facility] via EMS [facility] after being found beside his bed this am; pt with dementia and unable to say exactly what happened; contusion above right eyebrow;

## 2018-06-06 LAB — BASIC METABOLIC PANEL
ANION GAP: 5 (ref 5–15)
BUN: 13 mg/dL (ref 8–23)
CALCIUM: 8.5 mg/dL — AB (ref 8.9–10.3)
CO2: 28 mmol/L (ref 22–32)
Chloride: 105 mmol/L (ref 98–111)
Creatinine, Ser: 0.95 mg/dL (ref 0.61–1.24)
Glucose, Bld: 224 mg/dL — ABNORMAL HIGH (ref 70–99)
POTASSIUM: 4 mmol/L (ref 3.5–5.1)
SODIUM: 138 mmol/L (ref 135–145)

## 2018-06-06 LAB — URINE CULTURE: Culture: NO GROWTH

## 2018-06-06 LAB — CBC
HCT: 37.3 % — ABNORMAL LOW (ref 40.0–52.0)
Hemoglobin: 12.2 g/dL — ABNORMAL LOW (ref 13.0–18.0)
MCH: 26.1 pg (ref 26.0–34.0)
MCHC: 32.6 g/dL (ref 32.0–36.0)
MCV: 80 fL (ref 80.0–100.0)
Platelets: 247 10*3/uL (ref 150–440)
RBC: 4.66 MIL/uL (ref 4.40–5.90)
RDW: 19.1 % — ABNORMAL HIGH (ref 11.5–14.5)
WBC: 9 10*3/uL (ref 3.8–10.6)

## 2018-06-06 MED ORDER — METFORMIN HCL 500 MG PO TABS
500.0000 mg | ORAL_TABLET | Freq: Every day | ORAL | Status: DC
  Administered 2018-06-07 – 2018-06-08 (×2): 500 mg via ORAL
  Filled 2018-06-06 (×2): qty 1

## 2018-06-06 NOTE — Progress Notes (Signed)
[facility] - [place] at [facility]   PATIENT NAME: Dennis Sandoval    MR#:  [id number]  DATE OF BIRTH:  07/06/1941  SUBJECTIVE:  CHIEF COMPLAINT:   Chief Complaint  Patient presents with  . Fall   Have dementia and lives in a group home, came with lethargy.  Much better after treatment of UTI today.  REVIEW OF SYSTEMS:  Due to dementia, patient is not able to give review of system.  ROS  DRUG ALLERGIES:  No Known Allergies  VITALS:  Blood pressure (!) 148/93, pulse 75, temperature 97.7 F (36.5 C), temperature source Oral, resp. rate 18, height 5\' 10"  (1.778 m), weight 77.8 kg (171 lb 8.3 oz), SpO2 100 %.  PHYSICAL EXAMINATION:  GENERAL:  77 y.o.-year-old patient lying in the bed with no acute distress.  EYES: Pupils equal, round, reactive to light and accommodation. No scleral icterus. Extraocular muscles intact.  HEENT: Head atraumatic, normocephalic. Oropharynx and nasopharynx clear.  NECK:  Supple, no jugular venous distention. No thyroid enlargement, no tenderness.  LUNGS: Normal breath sounds bilaterally, no wheezing, rales,rhonchi or crepitation. No use of accessory muscles of respiration.  CARDIOVASCULAR: S1, S2 normal. No murmurs, rubs, or gallops.  ABDOMEN: Soft, nontender, nondistended. Bowel sounds present. No organomegaly or mass.  EXTREMITIES: No pedal edema, cyanosis, or clubbing.  NEUROLOGIC: Cranial nerves II through XII are intact. Muscle strength 4/5 in all extremities. Sensation intact. Gait not checked.  PSYCHIATRIC: The patient is alert and oriented x 0.  SKIN: No obvious rash, lesion, or ulcer.   Physical Exam LABORATORY PANEL:   CBC Recent Labs  Lab 06/06/18 0534  WBC 9.0  HGB 12.2*  HCT 37.3*  PLT 247   ------------------------------------------------------------------------------------------------------------------  Chemistries  Recent Labs  Lab 06/06/18 0534  NA 138  K 4.0  CL 105  CO2 28  GLUCOSE 224*  BUN 13   CREATININE 0.95  CALCIUM 8.5*   ------------------------------------------------------------------------------------------------------------------  Cardiac Enzymes Recent Labs  Lab 06/05/18 0901  TROPONINI <0.03   ------------------------------------------------------------------------------------------------------------------  RADIOLOGY:  Ct Head Wo Contrast  Result Date: 06/05/2018 CLINICAL DATA:  77 year old male found by bed this morning. Dementia. Initial encounter. EXAM: CT HEAD WITHOUT CONTRAST TECHNIQUE: Contiguous axial images were obtained from the base of the skull through the vertex without intravenous contrast. COMPARISON:  04/25/2018 head CT. FINDINGS: Brain: No intracranial hemorrhage or CT evidence of large acute infarct. Remote small left cerebellar infarct. Moderate chronic microvascular changes. Moderate global atrophy. No intracranial mass lesion noted on this unenhanced exam. Vascular: Vascular calcifications Skull: No skull fracture Sinuses/Orbits: Exophthalmos. Remote right medial orbital wall fracture. Minimal mucosal thickening ethmoid sinus air cells. Other: Mastoid air cells and middle ear cavities are clear. IMPRESSION: No skull fracture or intracranial hemorrhage. No evidence of large acute infarct. Remote small left cerebellar infarct. Moderate chronic microvascular changes. Atrophy. Electronically Signed   By: Lacy DuverneySteven  Olson M.D.   On: 06/05/2018 07:46   Dg Chest Portable 1 View  Result Date: 06/05/2018 CLINICAL DATA:  Altered mental status. EXAM: PORTABLE CHEST 1 VIEW COMPARISON:  03/27/2018 FINDINGS: There is minimal atelectasis at the lung bases. No consolidative infiltrates or appreciable effusions. Heart size and vascularity are normal. No significant bone abnormality. IMPRESSION: Minimal bibasilar atelectasis, probably due to a shallow inspiration. Otherwise, negative exam. Electronically Signed   By: Francene BoyersJames  Maxwell M.D.   On: 06/05/2018 08:06     ASSESSMENT AND PLAN:   Active Problems:   UTI (urinary tract infection)  77 year old male patient  with history of dementia, diabetes mellitus type 2, hypertension presented to the emergency room for lethargy confusion and possible fall  -Altered mental status secondary to urinary tract infection Start patient on IV Rocephin antibiotic   Improving, have baseline dementia.  -Urinary tract infection IV Rocephin antibiotic 1 g daily Follow-up urine cultures  -Type 2 diabetes mellitus Diabetic diet with sliding scale coverage with insulin At his group home, they do not check blood sugars or give insulin injections. I will start him on small dose of metformin and target for blood sugar level between 150-200-as due to terminal dementia he is oral intake is not reliable sometimes.  -DVT prophylaxis With subcu Lovenox daily    All the records are reviewed and case discussed with Care Management/Social Workerr. Management plans discussed with the patient, family and they are in agreement.  CODE STATUS: Full.  TOTAL TIME TAKING CARE OF THIS PATIENT: 35 minutes.   Discussed with daughter-in-law in the room, try to call son on the phone but could not connect.  POSSIBLE D/C IN 1-2 DAYS, DEPENDING ON CLINICAL CONDITION.   Altamese Dilling M.D on 06/06/2018   Between 7am to 6pm - Pager - [phone]  After 6pm go to [email or web address] - Social research officer, government  [facility] [place] [facility]  Office  [phone]  CC: Primary care physician; [facility], [facility]  Note: This dictation was prepared with Dragon dictation along with smaller phrase technology. Any transcriptional errors that result from this process are unintentional.

## 2018-06-06 NOTE — Progress Notes (Addendum)
Inpatient Diabetes Program Recommendations  AACE/ADA: New Consensus Statement on Inpatient Glycemic Control (2019)  Target Ranges:  Prepandial:   less than 140 mg/dL      Peak postprandial:   less than 180 mg/dL (1-2 hours)      Critically ill patients:  140 - 180 mg/dL   Results for Dennis Sandoval, Dennis Sandoval (MRN [id number]) as of 06/06/2018 08:07  Ref. Range 06/05/2018 09:01 06/06/2018 05:34  Glucose Latest Ref Range: 70 - 99 mg/dL 086235 (H) 578224 (H)   Results for Dennis Sandoval, Dennis Sandoval (MRN [id number]) as of 06/06/2018 08:07  Ref. Range 03/27/2018 06:15  Hemoglobin A1C Latest Ref Range: 4.8 - 5.6 % 7.2 (H)   Review of Glycemic Control  Diabetes history: DM2 Outpatient Diabetes medications: None Current orders for Inpatient glycemic control: None  Inpatient Diabetes Program Recommendations:  Insulin-Correction: While inpatient, please consider ordering CBGs with Novolog 0-9 units TID with meals and Novolog 0-5 units QHS.  NOTE: In reviewing chart, noted patient has a documented history of DM2 but not prescribed any DM medications as an outpatient. [facility] be [facility] to facility.   Addendum 06/06/18@12 :45-Consult received with reason "increased blood glucoses, family mentioned being told patient is 'pre-diabetic'".  Patient has a documented history of DM and is from facility. Went by to talk with family but no family present in patient room and patient asleep. Signed off consult; please re-consult if needed.  Thanks, Dennis PennerMarie Rosalina Dingwall, RN, MSN, CDE Diabetes Coordinator Inpatient Diabetes Program [phone] (Team Pager from 8am to 5pm)

## 2018-06-07 DIAGNOSIS — S0011XA Contusion of right eyelid and periocular area, initial encounter: Secondary | ICD-10-CM | POA: Diagnosis present

## 2018-06-07 DIAGNOSIS — F0280 Dementia in other diseases classified elsewhere without behavioral disturbance: Secondary | ICD-10-CM | POA: Diagnosis present

## 2018-06-07 DIAGNOSIS — W19XXXA Unspecified fall, initial encounter: Secondary | ICD-10-CM | POA: Diagnosis present

## 2018-06-07 DIAGNOSIS — R4182 Altered mental status, unspecified: Secondary | ICD-10-CM | POA: Diagnosis present

## 2018-06-07 DIAGNOSIS — Z79899 Other long term (current) drug therapy: Secondary | ICD-10-CM | POA: Diagnosis not present

## 2018-06-07 DIAGNOSIS — I10 Essential (primary) hypertension: Secondary | ICD-10-CM | POA: Diagnosis present

## 2018-06-07 DIAGNOSIS — Z87891 Personal history of nicotine dependence: Secondary | ICD-10-CM | POA: Diagnosis not present

## 2018-06-07 DIAGNOSIS — G309 Alzheimer's disease, unspecified: Secondary | ICD-10-CM | POA: Diagnosis present

## 2018-06-07 DIAGNOSIS — E119 Type 2 diabetes mellitus without complications: Secondary | ICD-10-CM | POA: Diagnosis present

## 2018-06-07 DIAGNOSIS — N390 Urinary tract infection, site not specified: Secondary | ICD-10-CM | POA: Diagnosis present

## 2018-06-07 LAB — GLUCOSE, CAPILLARY
GLUCOSE-CAPILLARY: 199 mg/dL — AB (ref 70–99)
Glucose-Capillary: 179 mg/dL — ABNORMAL HIGH (ref 70–99)

## 2018-06-07 LAB — TSH: TSH: 1.986 u[IU]/mL (ref 0.350–4.500)

## 2018-06-07 MED ORDER — INSULIN ASPART 100 UNIT/ML ~~LOC~~ SOLN: 0.0000 [IU] | Freq: Every day | SUBCUTANEOUS | Status: DC

## 2018-06-07 MED ORDER — INSULIN ASPART 100 UNIT/ML ~~LOC~~ SOLN
0.0000 [IU] | Freq: Three times a day (TID) | SUBCUTANEOUS | Status: DC
  Administered 2018-06-07: 2 [IU] via SUBCUTANEOUS
  Administered 2018-06-08: 3 [IU] via SUBCUTANEOUS
  Administered 2018-06-08: 08:00:00 1 [IU] via SUBCUTANEOUS
  Filled 2018-06-07 (×3): qty 1

## 2018-06-07 NOTE — Progress Notes (Signed)
[facility] - [place] at [facility]   PATIENT NAME: Dennis Sandoval    MR#:  [id number]  DATE OF BIRTH:  Sep 01, 1941  SUBJECTIVE:  CHIEF COMPLAINT:   Chief Complaint  Patient presents with  . Fall   Have dementia and lives in a group home, came with lethargy.  Much better after treatment of UTI today. Still some lethargy and weakness. REVIEW OF SYSTEMS:  Due to dementia, patient is not able to give review of system.  ROS  DRUG ALLERGIES:  No Known Allergies  VITALS:  Blood pressure (!) 139/93, pulse 71, temperature 97.9 F (36.6 C), resp. rate 16, height 5\' 10"  (1.778 m), weight 77.8 kg (171 lb 8.3 oz), SpO2 93 %.  PHYSICAL EXAMINATION:  GENERAL:  77 y.o.-year-old patient lying in the bed with no acute distress.  EYES: Pupils equal, round, reactive to light and accommodation. No scleral icterus. Extraocular muscles intact.  HEENT: Head atraumatic, normocephalic. Oropharynx and nasopharynx clear.  NECK:  Supple, no jugular venous distention. No thyroid enlargement, no tenderness.  LUNGS: Normal breath sounds bilaterally, no wheezing, rales,rhonchi or crepitation. No use of accessory muscles of respiration.  CARDIOVASCULAR: S1, S2 normal. No murmurs, rubs, or gallops.  ABDOMEN: Soft, nontender, nondistended. Bowel sounds present. No organomegaly or mass.  EXTREMITIES: No pedal edema, cyanosis, or clubbing.  NEUROLOGIC: Cranial nerves II through XII are intact. Muscle strength 4/5 in all extremities. Sensation intact. Gait not checked.  PSYCHIATRIC: The patient is alert and oriented x 0.  SKIN: No obvious rash, lesion, or ulcer.   Physical Exam LABORATORY PANEL:   CBC Recent Labs  Lab 06/06/18 0534  WBC 9.0  HGB 12.2*  HCT 37.3*  PLT 247   ------------------------------------------------------------------------------------------------------------------  Chemistries  Recent Labs  Lab 06/06/18 0534  NA 138  K 4.0  CL 105  CO2 28  GLUCOSE 224*   BUN 13  CREATININE 0.95  CALCIUM 8.5*   ------------------------------------------------------------------------------------------------------------------  Cardiac Enzymes Recent Labs  Lab 06/05/18 0901  TROPONINI <0.03   ------------------------------------------------------------------------------------------------------------------  RADIOLOGY:  No results found.  ASSESSMENT AND PLAN:   Active Problems:   UTI (urinary tract infection)   Altered mental status  77 year old male patient with history of dementia, diabetes mellitus type 2, hypertension presented to the emergency room for lethargy confusion and possible fall  -Altered mental status secondary to urinary tract infection  on IV Rocephin antibiotic   Improving, have baseline dementia.  Ur cx negative, so stop after 3 doses. Will get TSH.  -Urinary tract infection IV Rocephin antibiotic 1 g daily Follow-up urine cultures- negative. Stopped Abx after 3 doses.  -Type 2 diabetes mellitus Diabetic diet with sliding scale coverage with insulin At his group home, they do not check blood sugars or give insulin injections. I will start him on small dose of metformin and target for blood sugar level between 150-200-as due to terminal dementia he is oral intake is not reliable sometimes. Keep on sliding scale coverage.  -DVT prophylaxis With subcu Lovenox daily  Awaited PT eval and improvement in mental status.  All the records are reviewed and case discussed with Care Management/Social Workerr. Management plans discussed with the patient, family and they are in agreement.  CODE STATUS: Full.  TOTAL TIME TAKING CARE OF THIS PATIENT: 35 minutes.   POSSIBLE D/C IN 1-2 DAYS, DEPENDING ON CLINICAL CONDITION.   Altamese Dilling M.D on 06/07/2018   Between 7am to 6pm - Pager - [phone]  After 6pm go to [email or web address] -  password Environmental education officerPAS [email or web address]  [facility]  Office   [phone]  CC: Primary care physician; Housecalls, Doctors Making  Note: This dictation was prepared with Dragon dictation along with smaller phrase technology. Any transcriptional errors that result from this process are unintentional.

## 2018-06-07 NOTE — Plan of Care (Signed)
  Problem: Education: Goal: Knowledge of General Education information will improve Description Including pain rating scale, medication(s)/side effects and non-pharmacologic comfort measures Outcome: Progressing   

## 2018-06-08 LAB — GLUCOSE, CAPILLARY
GLUCOSE-CAPILLARY: 148 mg/dL — AB (ref 70–99)
Glucose-Capillary: 221 mg/dL — ABNORMAL HIGH (ref 70–99)

## 2018-06-08 MED ORDER — CEPHALEXIN 250 MG PO CAPS: 250.0000 mg | ORAL_CAPSULE | Freq: Three times a day (TID) | ORAL | 0 refills | Status: AC

## 2018-06-08 MED ORDER — METFORMIN HCL 500 MG PO TABS: 500.0000 mg | ORAL_TABLET | Freq: Two times a day (BID) | ORAL | 0 refills | Status: AC

## 2018-06-08 NOTE — Evaluation (Signed)
Physical Therapy Evaluation Patient Details Name: Dennis Sandoval MRN: [id number] DOB: 1941-01-17 Today's Date: 06/08/2018   History of Present Illness  Dennis Sandoval  is a 77 y.o. male with a known history of advanced Alzheimer's dementia diabetes mellitus type 2, hypertension, history of urinary tract infection was found at bedside at the assisted living facility.  Patient has a small contusion over the right eyebrow.  He is lethargic with decreased responsiveness.  Patient was referred to the emergency room and he was evaluated and has urinary tract infection.. Pt admitted to hospitilist services for further monitoring.   Clinical Impression  Pt cognitive status limits PT ability to fully evaluate pt. Pt is more alert and arousable than earlier in day but is still unable to carry on meaningful conversation or to answer question related to pt history or hospitalization. Pt is A+Ox1. Pt was able to actively move BLE against gravity and with mod assist and verbal and physical cueing move from supine to sit on EOB. Pt stood w/RW and mod assist, requiring high amount of cuing and increased time. Pt ambulated <5 feet towards recliner with RW mod assist and again high levels of cueing and time. Due to pt lethargy and cognitive stauts PT was only min able to evaluate pt. Overall pt requires at least mod assist with bed mobility and tranfers, and is highly unstable and unsteady with all standing and ambulatory activities. Would benefit from skilled PT to address above deficits and promote optimal return to PLOF. Recommend transition to SNF/ALF upon discharge from acute hospitalization.     Follow Up Recommendations SNF    Equipment Recommendations  Rolling walker with 5" wheels    Recommendations for Other Services       Precautions / Restrictions Precautions Precautions: Fall Restrictions Weight Bearing Restrictions: No      Mobility  Bed Mobility Overal bed mobility: Needs  Assistance Bed Mobility: Supine to Sit     Supine to sit: Max assist     General bed mobility comments: Pt requires verbal and physical cueing for sitting upright. Pt needs max assist from therpasit to fully come to EOB.   Transfers Overall transfer level: Needs assistance Equipment used: Rolling walker (2 wheeled) Transfers: Sit to/from Stand Sit to Stand: Mod assist         General transfer comment: Pt needs increased physcial cueing and time to initiate movement, and mod assist to posterior hips and BUE for compelte sit to stand.   Ambulation/Gait Ambulation/Gait assistance: Mod assist Gait Distance (Feet): 3 Feet Assistive device: Rolling walker (2 wheeled) Gait Pattern/deviations: Decreased step length - left;Decreased step length - right;Step-to pattern     General Gait Details: Pt's gait assessment is limted due to small distance and pt's inability to cognitively cooperate with actvitity. Pt needs physcial cueing to intitie movment and to direct pt to chair.   Stairs            Wheelchair Mobility    Modified Rankin (Stroke Patients Only)       Balance Overall balance assessment: Needs assistance Sitting-balance support: Bilateral upper extremity supported;Feet supported Sitting balance-Leahy Scale: Poor Sitting balance - Comments: Pt drifts backwards without tharapist assist   Standing balance support: Bilateral upper extremity supported Standing balance-Leahy Scale: Poor Standing balance comment: Pt requires mod asssit and physcial cueing from therapsit, BUE on [place] and physcial cueing to posterior help to maiantina upright posture.  Pertinent Vitals/Pain      Home Living Family/patient expects to be discharged to:: Assisted living               Home Equipment: Dan HumphreysWalker - 2 wheels [facility]    Prior Function           Comments: Pt unable to engage with therapist in  questions regarding prior function due to cognitive status. Per chart review--ambulated w/RW in home min asssit and needed some assist with ADL's.      Hand Dominance   Dominant Hand: Right    Extremity/Trunk Assessment   Upper Extremity Assessment Upper Extremity Assessment: Overall WFL for tasks assessed    Lower Extremity Assessment Lower Extremity Assessment: Overall WFL for tasks assessed    Cervical / Trunk Assessment Cervical / Trunk Assessment: Kyphotic  Communication   Communication: (pt's cognitive status impairs ability to communicate with therapist, beyond basic 1 step commands)  Cognition Arousal/Alertness: Lethargic Behavior During Therapy: Flat affect Overall Cognitive Status: History of cognitive impairments - at baseline                                 General Comments: Pt's baseline dementia limts pt to A+O x 1. Pt difficulty maintaining arousal and respnding to and communicated with therapist.       General Comments      Exercises Other Exercises Other Exercises: Bed mobility: Supine to sit mod assist; Transfers: Sit to/from stand mod assist; Ambulation: <5 feet mod asssit w/RW   Assessment/Plan    PT Assessment Patient needs continued PT services  PT Problem List Decreased strength;Decreased mobility;Decreased safety awareness;Decreased cognition;Decreased balance;Decreased activity tolerance;Decreased range of motion;Decreased coordination;Decreased knowledge of precautions;Decreased knowledge of use of DME       PT Treatment Interventions DME instruction;Gait training;Stair training;Functional mobility training;Balance training;Therapeutic exercise;Therapeutic activities;Patient/family education    PT Goals (Current goals can be found in the Care Plan section)       Frequency Min 2X/week   Barriers to discharge        Co-evaluation               AM-PAC PT "6 Clicks" Daily Activity  Outcome Measure Difficulty turning  over in bed (including adjusting bedclothes, sheets and blankets)?: Unable Difficulty moving from lying on back to sitting on the side of the bed? : Unable Difficulty sitting down on and standing up from a chair with arms (e.g., wheelchair, bedside commode, etc,.)?: Unable Help needed moving to and from a bed to chair (including a wheelchair)?: A Lot Help needed walking in hospital room?: A Lot Help needed climbing 3-5 steps with a railing? : Total 6 Click Score: 8    End of Session Equipment Utilized During Treatment: Gait belt Activity Tolerance: Patient limited by fatigue;Patient limited by lethargy;Treatment limited secondary to medical complications (Comment) Patient left: in chair;with chair alarm set;with call bell/phone within reach Nurse Communication: Mobility status PT Visit Diagnosis: Unsteadiness on feet (R26.81);Other abnormalities of gait and mobility (R26.89);Muscle weakness (generalized) (M62.81)    Time: [id number] PT Time Calculation (min) (ACUTE ONLY): 17 min   Charges:   PT Evaluation $PT Eval Moderate Complexity: 1 Mod         AssurantColby Deona Novitski, SPT 06/08/18,4:41 PM

## 2018-06-08 NOTE — Discharge Summary (Signed)
[facility] [place] [facility]   PATIENT NAME: Dennis Sandoval    MR#:  [id number]  DATE OF BIRTH:  01/03/1941  DATE OF ADMISSION:  06/05/2018 ADMITTING PHYSICIAN: Ihor Austin, MD  DATE OF DISCHARGE: 06/08/2018  PRIMARY CARE PHYSICIAN: [facility]   ADMISSION DIAGNOSIS:  Lower urinary tract infectious disease [N39.0] Fall, initial encounter [W19.XXXA] Altered mental status, unspecified altered mental status type [R41.82]  DISCHARGE DIAGNOSIS:  Active Problems:   UTI (urinary tract infection)   Altered mental status   SECONDARY DIAGNOSIS:   Past Medical History:  Diagnosis Date  . Alzheimer disease 06/16/2015  . Dementia   . Diabetes mellitus without complication (HCC)   . Hypertension   . UTI (urinary tract infection)      ADMITTING HISTORY  HISTORY OF PRESENT ILLNESS: Dennis Sandoval  is a 77 y.o. male with a known history of advanced Alzheimer's dementia diabetes mellitus type 2, hypertension, history of urinary tract infection was found at bedside at the assisted living facility.  Patient has a small contusion over the right eyebrow.  He is lethargic with decreased responsiveness.  Patient was referred to the emergency room and he was evaluated and has urinary tract infection.  Patient is more responsive in the emergency room opens eyes for verbal commands but not completely oriented secondary to dementia.  Moves all extremities.  Hospitalist service was consulted for further care.  HOSPITAL COURSE:   77 year old male patient with history of dementia, diabetes mellitus type 2, hypertension presented to the emergency room for lethargy confusion and possible fall  -Altered mental status secondary to urinary tract infection  Improved.  He does have baseline dementia.  -Urinary tract infection IV Rocephin in the hospital.  Urine cultures are negative but patient's urinalysis showed UTI and symptoms improved with IV antibiotics.   Will transition to oral Keflex for 3 more days at discharge.  -Type 2 diabetes mellitus Started on metformin.  Prescription given at discharge  Stable for discharge to ALF  CONSULTS OBTAINED:    DRUG ALLERGIES:  No Known Allergies  DISCHARGE MEDICATIONS:   Allergies as of 06/08/2018   No Known Allergies     Medication List    TAKE these medications   cephALEXin 250 MG capsule Commonly known as:  KEFLEX Take 1 capsule (250 mg total) by mouth 3 (three) times daily for 3 days.   divalproex 125 MG capsule Commonly known as:  DEPAKOTE SPRINKLE Take 125 mg by mouth every 12 (twelve) hours.   donepezil 10 MG tablet Commonly known as:  ARICEPT Take 1 tablet (10 mg total) by mouth at bedtime. What changed:  when to take this   escitalopram 10 MG tablet Commonly known as:  LEXAPRO Take 0.5 tablets (5 mg total) by mouth daily. (for mood) What changed:    how much to take  additional instructions   memantine 5 MG tablet Commonly known as:  NAMENDA Take 1 tablet (5 mg total) by mouth 2 (two) times daily.   metFORMIN 500 MG tablet Commonly known as:  GLUCOPHAGE Take 1 tablet (500 mg total) by mouth 2 (two) times daily with a meal.   OLANZapine zydis 5 MG disintegrating tablet Commonly known as:  ZYPREXA Take 5 mg by mouth at bedtime.   OLANZapine 2.5 MG tablet Commonly known as:  ZYPREXA Take 2.5 mg by mouth 2 (two) times daily as needed (agitation).   traZODone 50 MG tablet Commonly known as:  DESYREL Take 1 tablet (  50 mg total) by mouth at bedtime as needed for sleep. What changed:  when to take this       Today   VITAL SIGNS:  Blood pressure (!) 138/92, pulse 76, temperature (!) 97.5 F (36.4 C), temperature source Oral, resp. rate 17, height 5\' 10"  (1.778 m), weight 77.8 kg (171 lb 8.3 oz), SpO2 98 %.  I/O:    Intake/Output Summary (Last 24 hours) at 06/08/2018 1015 Last data filed at 06/07/2018 1031 Gross per 24 hour  Intake 120 ml  Output -  Net  120 ml    PHYSICAL EXAMINATION:  Physical Exam  GENERAL:  77 y.o.-year-old patient lying in the bed with no acute distress.  LUNGS: Normal breath sounds bilaterally, no wheezing, rales,rhonchi or crepitation. No use of accessory muscles of respiration.  CARDIOVASCULAR: S1, S2 normal. No murmurs, rubs, or gallops.  ABDOMEN: Soft, non-tender, non-distended. Bowel sounds present. No organomegaly or mass.  NEUROLOGIC: Moves all 4 extremities. PSYCHIATRIC: The patient is alert and awake. confused SKIN: No obvious rash, lesion, or ulcer.   DATA REVIEW:   CBC Recent Labs  Lab 06/06/18 0534  WBC 9.0  HGB 12.2*  HCT 37.3*  PLT 247    Chemistries  Recent Labs  Lab 06/06/18 0534  NA 138  K 4.0  CL 105  CO2 28  GLUCOSE 224*  BUN 13  CREATININE 0.95  CALCIUM 8.5*    Cardiac Enzymes Recent Labs  Lab 06/05/18 0901  TROPONINI <0.03    Microbiology Results  Results for orders placed or performed during the hospital encounter of 06/05/18  Urine Culture     Status: None   Collection Time: 06/05/18 10:23 AM  Result Value Ref Range Status   Specimen Description   Final    URINE, RANDOM Performed at [facility], [address], [place], [place] [address]    Special Requests   Final    NONE Performed at [facility], [address], [place], [place] [address]    Culture   Final    NO GROWTH Performed at [facility], [address], [place], [place] [address]    Report Status 06/06/2018 FINAL  Final  MRSA PCR Screening     Status: None   Collection Time: 06/05/18  5:50 PM  Result Value Ref Range Status   MRSA by PCR NEGATIVE NEGATIVE Final    Comment:        The GeneXpert MRSA Assay (FDA approved for NASAL specimens only), is one component of a comprehensive MRSA colonization surveillance program. It is not intended to diagnose MRSA infection nor to guide or monitor treatment for MRSA infections. Performed at [facility],  [address], [place], [place] [address]     RADIOLOGY:  No results found.  Follow up with PCP in 1 week.  Management plans discussed with the patient, family and they are in agreement.  CODE STATUS:     Code Status Orders  (From admission, onward)        Start     Ordered   06/05/18 1324  Full code  Continuous     06/05/18 1323    Code Status History    Date Active Date Inactive Code Status Order ID Comments User Context   03/27/2018 0842 03/29/2018 1955 Full Code [id number]  [name], MD Inpatient    Advance Directive Documentation     Most Recent Value  Type of Advance Directive  Living will  Pre-existing out  of facility DNR order (yellow form or pink MOST form)  -  "MOST" Form in Place?  -      TOTAL TIME TAKING CARE OF THIS PATIENT ON DAY OF DISCHARGE: more than 30 minutes.   Molinda BailiffSrikar R Aseneth Hack M.D on 06/08/2018 at 10:15 AM  Between 7am to 6pm - Pager - [phone]  After 6pm go to [email or web address] - password [email or web address]  [facility] [place] [facility]  Office  [phone]  CC: Primary care physician; [facility]  Note: This dictation was prepared with Dragon dictation along with smaller phrase technology. Any transcriptional errors that result from this process are unintentional.

## 2018-06-08 NOTE — Progress Notes (Signed)
[facility]. Report called to accepting RN. IV removed, pt on room air and in no distress. Otilio JeffersonMadelyn S Fenton, RN

## 2018-06-08 NOTE — Progress Notes (Signed)
PT Cancellation Note  Patient Details Name: Dennis Sandoval MRN: [id number] DOB: 05/15/1941   Cancelled Treatment:     Pt was unable to be aroused from sleep. Pt was responsive to sternal rub, but minimally so, and immediately returned to sleeping. Therapy Evaluation deferred to later date when pt is alert and able to engage.   Grayland Jackolby Creed Kail, SPT 06/08/18,10:02 AM

## 2018-06-08 NOTE — Clinical Social Work Note (Signed)
Patient is medically ready for discharge today. CSW notified Z[facility]Robin [facility] that patient will return. CSW also notified patient's son Juanito DoomDarrell Michelin. Son states that his brother will pick up patient and transport him back to facility. RN will call report.   Ruthe Mannanandace Leah Skora MSW, [address] [phone]

## 2018-06-08 NOTE — Discharge Instructions (Signed)
Resume diet and activity as before ° ° °

## 2018-06-08 NOTE — Plan of Care (Signed)
  Problem: Education: Goal: Knowledge of General Education information will improve Description: Including pain rating scale, medication(s)/side effects and non-pharmacologic comfort measures Outcome: Progressing   Problem: Health Behavior/Discharge Planning: Goal: Ability to manage health-related needs will improve Outcome: Progressing   Problem: Safety: Goal: Ability to remain free from injury will improve Outcome: Progressing   

## 2018-06-12 DIAGNOSIS — I10 Essential (primary) hypertension: Secondary | ICD-10-CM | POA: Diagnosis not present

## 2018-06-12 DIAGNOSIS — W19XXXD Unspecified fall, subsequent encounter: Secondary | ICD-10-CM | POA: Diagnosis not present

## 2018-06-12 DIAGNOSIS — R269 Unspecified abnormalities of gait and mobility: Secondary | ICD-10-CM | POA: Diagnosis not present

## 2018-06-15 DIAGNOSIS — E119 Type 2 diabetes mellitus without complications: Secondary | ICD-10-CM | POA: Diagnosis not present

## 2018-06-15 DIAGNOSIS — R269 Unspecified abnormalities of gait and mobility: Secondary | ICD-10-CM | POA: Diagnosis not present

## 2018-06-15 DIAGNOSIS — I10 Essential (primary) hypertension: Secondary | ICD-10-CM | POA: Diagnosis not present

## 2018-06-27 ENCOUNTER — Emergency Department: Payer: Medicare Other

## 2018-06-27 ENCOUNTER — Emergency Department
Admission: EM | Admit: 2018-06-27 | Discharge: 2018-06-27 | Disposition: A | Discharge status: Home / Self Care | Payer: Medicare Other | Attending: Emergency Medicine | Admitting: Emergency Medicine

## 2018-06-27 DIAGNOSIS — R0789 Other chest pain: Secondary | ICD-10-CM | POA: Diagnosis not present

## 2018-06-27 DIAGNOSIS — Z7984 Long term (current) use of oral hypoglycemic drugs: Secondary | ICD-10-CM | POA: Insufficient documentation

## 2018-06-27 DIAGNOSIS — Z79899 Other long term (current) drug therapy: Secondary | ICD-10-CM | POA: Diagnosis not present

## 2018-06-27 DIAGNOSIS — F0280 Dementia in other diseases classified elsewhere without behavioral disturbance: Secondary | ICD-10-CM | POA: Insufficient documentation

## 2018-06-27 DIAGNOSIS — E119 Type 2 diabetes mellitus without complications: Secondary | ICD-10-CM | POA: Diagnosis not present

## 2018-06-27 DIAGNOSIS — Z87891 Personal history of nicotine dependence: Secondary | ICD-10-CM | POA: Insufficient documentation

## 2018-06-27 DIAGNOSIS — I10 Essential (primary) hypertension: Secondary | ICD-10-CM | POA: Insufficient documentation

## 2018-06-27 DIAGNOSIS — R079 Chest pain, unspecified: Secondary | ICD-10-CM | POA: Diagnosis not present

## 2018-06-27 DIAGNOSIS — G309 Alzheimer's disease, unspecified: Secondary | ICD-10-CM | POA: Insufficient documentation

## 2018-06-27 LAB — URINALYSIS, COMPLETE (UACMP) WITH MICROSCOPIC
BILIRUBIN URINE: NEGATIVE
Bacteria, UA: NONE SEEN
GLUCOSE, UA: NEGATIVE mg/dL
HGB URINE DIPSTICK: NEGATIVE
KETONES UR: NEGATIVE mg/dL
LEUKOCYTES UA: NEGATIVE
NITRITE: NEGATIVE
PH: 6 (ref 5.0–8.0)
Protein, ur: NEGATIVE mg/dL
SPECIFIC GRAVITY, URINE: 1.019 (ref 1.005–1.030)
Squamous Epithelial / LPF: NONE SEEN (ref 0–5)

## 2018-06-27 LAB — COMPREHENSIVE METABOLIC PANEL
ALT: 8 U/L (ref 0–44)
AST: 13 U/L — ABNORMAL LOW (ref 15–41)
Albumin: 3.4 g/dL — ABNORMAL LOW (ref 3.5–5.0)
Alkaline Phosphatase: 68 U/L (ref 38–126)
Anion gap: 7 (ref 5–15)
BUN: 10 mg/dL (ref 8–23)
CALCIUM: 8.9 mg/dL (ref 8.9–10.3)
CO2: 28 mmol/L (ref 22–32)
CREATININE: 0.95 mg/dL (ref 0.61–1.24)
Chloride: 105 mmol/L (ref 98–111)
Glucose, Bld: 128 mg/dL — ABNORMAL HIGH (ref 70–99)
Potassium: 4.1 mmol/L (ref 3.5–5.1)
Sodium: 140 mmol/L (ref 135–145)
Total Bilirubin: 0.9 mg/dL (ref 0.3–1.2)
Total Protein: 7.3 g/dL (ref 6.5–8.1)

## 2018-06-27 LAB — TROPONIN I

## 2018-06-27 LAB — CBC
HCT: 40.9 % (ref 40.0–52.0)
HEMOGLOBIN: 13.2 g/dL (ref 13.0–18.0)
MCH: 26.4 pg (ref 26.0–34.0)
MCHC: 32.3 g/dL (ref 32.0–36.0)
MCV: 81.8 fL (ref 80.0–100.0)
Platelets: 264 10*3/uL (ref 150–440)
RBC: 5 MIL/uL (ref 4.40–5.90)
RDW: 20 % — ABNORMAL HIGH (ref 11.5–14.5)
WBC: 8.7 10*3/uL (ref 3.8–10.6)

## 2018-06-27 NOTE — ED Notes (Signed)
Lab tubes insufficient quantity so lab at bedside drawing blood at this time

## 2018-06-27 NOTE — ED Notes (Signed)
PIV placement attempted multiple times without success. Butterfly used for lab draw.

## 2018-06-27 NOTE — ED Triage Notes (Addendum)
Pt [facility] after getting up this morning and complaining of chest pain. He is at his baseline mental status. He is supposed to start a z pack for sinus infection tomorrow. He was recently treated for UTI. EKG normal with EMS. No cardiac hx. Pt denying pain at this time.

## 2018-06-27 NOTE — ED Provider Notes (Signed)
[facility] Emergency Department Provider Note   ____________________________________________    I have reviewed the triage vital signs and the nursing notes.   HISTORY  Chief Complaint Chest Pain  History severely limited by dementia   HPI [name] is a 77 y.o. male with significant Alzheimer's dementia who presents today with complaints of chest pain.  Apparently when the patient woke up this morning complained of chest pain.  He no longer complains of this.  Denies shortness of breath.  His son reports that he is at his baseline   Past Medical History:  Diagnosis Date  . Alzheimer disease 06/16/2015  . Dementia   . Diabetes mellitus without complication (HCC)   . Hypertension   . UTI (urinary tract infection)     Patient Active Problem List   Diagnosis Date Noted  . Altered mental status 06/07/2018  . UTI (urinary tract infection) 06/05/2018  . Sepsis (HCC) 03/27/2018  . 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 History of BPH 12/21/2016  . Medication monitoring encounter 01/28/2016  . Hyperglycemia 01/28/2016  . Preventative health care 12/21/2015  . Need for pneumococcal vaccination 12/21/2015  . Screening for AAA (abdominal aortic aneurysm) 12/01/2015  . Smoking history 11/28/2015  . Dementia 07/25/2015  . Benign hypertension 07/25/2015    Past Surgical History:  Procedure Laterality Date  . PROSTATE SURGERY      Prior to Admission medications   Medication Sig Start Date End Date Taking? Authorizing Provider  Cranberry 500 MG CAPS Take 500 mg by mouth daily.   Yes [provider]  divalproex (DEPAKOTE SPRINKLE) 125 MG capsule Take 125 mg by mouth every 12 (twelve) hours.    Yes [provider]  donepezil (ARICEPT) 10 MG tablet Take 1 tablet (10 mg total) by mouth at bedtime. Patient taking differently: Take 10 mg by mouth daily.  01/14/16  Yes Lada, Janit BernMelinda P, MD    escitalopram (LEXAPRO) 10 MG tablet Take 0.5 tablets (5 mg total) by mouth daily. (for mood) Patient taking differently: Take 10 mg by mouth daily.  01/14/16  Yes Lada, Janit BernMelinda P, MD  guaiFENesin (MUCINEX) 600 MG 12 hr tablet Take 600 mg by mouth 2 (two) times daily. 06/26/18 07/01/18 Yes [provider]  memantine (NAMENDA) 5 MG tablet Take 1 tablet (5 mg total) by mouth 2 (two) times daily. 04/14/16  Yes Lada, Janit BernMelinda P, MD  metFORMIN (GLUCOPHAGE) 500 MG tablet Take 1 tablet (500 mg total) by mouth 2 (two) times daily with a meal. 06/08/18 07/08/18 Yes Sudini, Srikar, MD  OLANZapine zydis (ZYPREXA) 5 MG disintegrating tablet Take 5 mg by mouth at bedtime.   Yes [provider]  traZODone (DESYREL) 50 MG tablet Take 1 tablet (50 mg total) by mouth at bedtime as needed for sleep. Patient taking differently: Take 50 mg by mouth at bedtime.  03/29/18  Yes Enedina FinnerPatel, Sona, MD     Allergies Patient has no known allergies.  Family History  Problem Relation Age of Onset  . Dementia Mother   . Dementia Father   . Cancer Brother        not sure what type  . Alcohol abuse Brother   . Heart disease Neg Hx   . Diabetes Neg Hx   . Stroke Neg Hx     Social History Social History   Tobacco Use  . Smoking status: Former Smoker    Packs/day: 0.50    Years: 50.00  Pack years: 25.00    Types: Cigarettes    Last attempt to quit: 11/27/1985    Years since quitting: 32.6  . Smokeless tobacco: Never Used  Substance Use Topics  . Alcohol use: No  . Drug use: No    5 caveat: Unable to obtain review of Systems due to severe dementia    ____________________________________________   PHYSICAL EXAM:  VITAL SIGNS: ED Triage Vitals  Enc Vitals Group     BP 06/27/18 1128 (!) 148/88     Pulse Rate 06/27/18 1128 76     Resp 06/27/18 1128 12     Temp 06/27/18 1128 97.8 F (36.6 C)     Temp Source 06/27/18 1128 Oral     SpO2 06/27/18 1128 100 %     Weight 06/27/18 1126 87 kg (191  lb 11.2 oz)     Height 06/27/18 1126 1.778 m (5\' 10" )     Head Circumference --      Peak Flow --      Pain Score 06/27/18 1125 0     Pain Loc --      Pain Edu? --      Excl. in GC? --     Constitutional: Alert.  No acute distress Eyes: Conjunctivae are normal.  Head: Atraumatic. Nose: No congestion/rhinnorhea. Mouth/Throat: Mucous membranes are moist.    Cardiovascular: Normal rate, irregularly irregular. Grossly normal heart sounds.  Good peripheral circulation. Respiratory: Normal respiratory effort.  No retractions. Lungs CTAB. Gastrointestinal: Soft and nontender. No distention.    Musculoskeletal:  Warm and well perfused Neurologic:  Normal speech and language. No gross focal neurologic deficits are appreciated.  Skin:  Skin is warm, dry and intact. No rash noted. Psychiatric: Mood and affect are normal. Speech and behavior are normal.  ____________________________________________   LABS (all labs ordered are listed, but only abnormal results are displayed)  Labs Reviewed  CBC - Abnormal; Notable for the following components:      Result Value   RDW 20.0 (*)    All other components within normal limits  COMPREHENSIVE METABOLIC PANEL - Abnormal; Notable for the following components:   Glucose, Bld 128 (*)    Albumin 3.4 (*)    AST 13 (*)    All other components within normal limits  URINALYSIS, COMPLETE (UACMP) WITH MICROSCOPIC - Abnormal; Notable for the following components:   Color, Urine YELLOW (*)    APPearance CLEAR (*)    All other components within normal limits  TROPONIN I   ____________________________________________  EKG  ED ECG REPORT I, Jene Everyobert Kippy Melena, the attending physician, personally viewed and interpreted this ECG.  Date: 06/27/2018  Rhythm: Atrial fibrillation QRS Axis: normal Intervals: Abnormal ST/T Wave abnormalities: normal Narrative Interpretation: no evidence of acute  ischemia  ____________________________________________  RADIOLOGY  Chest x-ray no acute change ____________________________________________   PROCEDURES  Procedure(s) performed: No  Procedures   Critical Care performed: No ____________________________________________   INITIAL IMPRESSION / ASSESSMENT AND PLAN / ED COURSE  Pertinent labs & imaging results that were available during my care of the patient were reviewed by me and considered in my medical decision making (see chart for details).  Patient overall well-appearing in no acute distress.  He has no complaints currently.  History severely limited by dementia.  EKG is reassuring, will check labs including troponin, chest x-ray and reevaluate.  Clinical Course as of Jun 27 1548  Tue Jun 27, 2018  1246 No sig change  DG Chest [place] [  RK]    Clinical Course User Index [RK] Jene Every, MD    Work-up is overall quite reassuring.  Patient has not complained of any chest pain during his stay here.  Urinalysis checked and family members request, unremarkable.  Okay for discharge with outpatient follow-up. ____________________________________________   FINAL CLINICAL IMPRESSION(S) / ED DIAGNOSES  Final diagnoses:  Chest pain, unspecified type        Note:  This document was prepared using Dragon voice recognition software and may include unintentional dictation errors.    Jene Every, MD 06/27/18 [phone]

## 2018-06-27 NOTE — ED Notes (Signed)
Pt resting. Respirations even and unlabored. NAD. Stretcher in low and locked position. Call bell in reach. Denies needs at this time. Family at bedside. Will continue to monitor. 

## 2018-06-29 DIAGNOSIS — E119 Type 2 diabetes mellitus without complications: Secondary | ICD-10-CM | POA: Diagnosis not present

## 2018-06-29 DIAGNOSIS — R609 Edema, unspecified: Secondary | ICD-10-CM | POA: Diagnosis not present

## 2018-06-29 DIAGNOSIS — G309 Alzheimer's disease, unspecified: Secondary | ICD-10-CM | POA: Diagnosis not present

## 2018-06-29 DIAGNOSIS — I10 Essential (primary) hypertension: Secondary | ICD-10-CM | POA: Diagnosis not present

## 2018-07-27 DIAGNOSIS — L84 Corns and callosities: Secondary | ICD-10-CM | POA: Diagnosis not present

## 2018-07-27 DIAGNOSIS — E1159 Type 2 diabetes mellitus with other circulatory complications: Secondary | ICD-10-CM | POA: Diagnosis not present

## 2018-07-27 DIAGNOSIS — B351 Tinea unguium: Secondary | ICD-10-CM | POA: Diagnosis not present

## 2018-07-27 DIAGNOSIS — M79676 Pain in unspecified toe(s): Secondary | ICD-10-CM | POA: Diagnosis not present

## 2018-07-31 DIAGNOSIS — E119 Type 2 diabetes mellitus without complications: Secondary | ICD-10-CM | POA: Diagnosis not present

## 2018-07-31 DIAGNOSIS — I10 Essential (primary) hypertension: Secondary | ICD-10-CM | POA: Diagnosis not present

## 2018-07-31 DIAGNOSIS — R269 Unspecified abnormalities of gait and mobility: Secondary | ICD-10-CM | POA: Diagnosis not present

## 2018-08-02 ENCOUNTER — Other Ambulatory Visit: Payer: Self-pay

## 2018-08-02 ENCOUNTER — Ambulatory Visit
Admission: EM | Admit: 2018-08-02 | Discharge: 2018-08-02 | Disposition: A | Discharge status: Home / Self Care | Payer: Medicare Other | Attending: Family Medicine | Admitting: Family Medicine

## 2018-08-02 DIAGNOSIS — R531 Weakness: Secondary | ICD-10-CM | POA: Diagnosis not present

## 2018-08-02 DIAGNOSIS — F0281 Dementia in other diseases classified elsewhere with behavioral disturbance: Secondary | ICD-10-CM | POA: Diagnosis not present

## 2018-08-02 DIAGNOSIS — Z87891 Personal history of nicotine dependence: Secondary | ICD-10-CM | POA: Insufficient documentation

## 2018-08-02 DIAGNOSIS — Z7984 Long term (current) use of oral hypoglycemic drugs: Secondary | ICD-10-CM | POA: Insufficient documentation

## 2018-08-02 DIAGNOSIS — I10 Essential (primary) hypertension: Secondary | ICD-10-CM | POA: Insufficient documentation

## 2018-08-02 DIAGNOSIS — Z79899 Other long term (current) drug therapy: Secondary | ICD-10-CM | POA: Diagnosis not present

## 2018-08-02 DIAGNOSIS — F0390 Unspecified dementia without behavioral disturbance: Secondary | ICD-10-CM | POA: Diagnosis not present

## 2018-08-02 DIAGNOSIS — G309 Alzheimer's disease, unspecified: Secondary | ICD-10-CM | POA: Diagnosis not present

## 2018-08-02 DIAGNOSIS — E119 Type 2 diabetes mellitus without complications: Secondary | ICD-10-CM | POA: Diagnosis not present

## 2018-08-02 LAB — COMPREHENSIVE METABOLIC PANEL
ALBUMIN: 3.6 g/dL (ref 3.5–5.0)
ALK PHOS: 67 U/L (ref 38–126)
ALT: 12 U/L (ref 0–44)
ANION GAP: 9 (ref 5–15)
AST: 12 U/L — ABNORMAL LOW (ref 15–41)
BILIRUBIN TOTAL: 0.3 mg/dL (ref 0.3–1.2)
BUN: 14 mg/dL (ref 8–23)
CALCIUM: 8.8 mg/dL — AB (ref 8.9–10.3)
CO2: 26 mmol/L (ref 22–32)
CREATININE: 0.89 mg/dL (ref 0.61–1.24)
Chloride: 102 mmol/L (ref 98–111)
GFR calc Af Amer: >60 mL/min (ref >60)
GFR calc non Af Amer: >60 mL/min (ref >60)
GLUCOSE: 162 mg/dL — AB (ref 70–99)
Potassium: 3.6 mmol/L (ref 3.5–5.1)
Sodium: 137 mmol/L (ref 135–145)
Total Protein: 7.7 g/dL (ref 6.5–8.1)

## 2018-08-02 LAB — CBC WITH DIFFERENTIAL/PLATELET
Basophils Absolute: 0 10*3/uL (ref 0–0.1)
Basophils Relative: 1 %
Eosinophils Absolute: 0.2 10*3/uL (ref 0–0.7)
Eosinophils Relative: 2 %
HEMATOCRIT: 39.9 % — AB (ref 40.0–52.0)
HEMOGLOBIN: 12.7 g/dL — AB (ref 13.0–18.0)
LYMPHS ABS: 1.4 10*3/uL (ref 1.0–3.6)
LYMPHS PCT: 18 %
MCH: 26 pg (ref 26.0–34.0)
MCHC: 31.9 g/dL — AB (ref 32.0–36.0)
MCV: 81.5 fL (ref 80.0–100.0)
Monocytes Absolute: 0.7 10*3/uL (ref 0.2–1.0)
Monocytes Relative: 9 %
NEUTROS PCT: 70 %
Neutro Abs: 5.4 10*3/uL (ref 1.4–6.5)
Platelets: 289 10*3/uL (ref 150–440)
RBC: 4.89 MIL/uL (ref 4.40–5.90)
RDW: 19.8 % — ABNORMAL HIGH (ref 11.5–14.5)
WBC: 7.7 10*3/uL (ref 3.8–10.6)

## 2018-08-02 LAB — URINALYSIS, COMPLETE (UACMP) WITH MICROSCOPIC
BACTERIA UA: NONE SEEN
Bilirubin Urine: NEGATIVE
Glucose, UA: 100 mg/dL — AB
Ketones, ur: NEGATIVE mg/dL
Leukocytes, UA: NEGATIVE
Nitrite: NEGATIVE
PH: 7 (ref 5.0–8.0)
Protein, ur: NEGATIVE mg/dL
SPECIFIC GRAVITY, URINE: 1.015 (ref 1.005–1.030)
Squamous Epithelial / LPF: NONE SEEN (ref 0–5)
WBC UA: NONE SEEN WBC/hpf (ref 0–5)

## 2018-08-02 LAB — GLUCOSE, CAPILLARY: GLUCOSE-CAPILLARY: 151 mg/dL — AB (ref 70–99)

## 2018-08-02 NOTE — ED Notes (Signed)
Pt arrived to urgent care with pull up saturated in urine. Pt unable to urinate after drinking fluids. Attempt for straight cath unable to pass prostate.

## 2018-08-02 NOTE — ED Triage Notes (Signed)
[facility] and staff told family he might have a UTI due to seeming weak today. This is consistent with past UTIs. Pt with dementia.

## 2018-08-02 NOTE — Discharge Instructions (Addendum)
Recommend increase fluids Monitor and follow up if worsening symptoms or new symptoms develop

## 2018-08-02 NOTE — ED Provider Notes (Signed)
[facility]    CSN: [id number] Arrival date & time: 08/02/18  1316     History   Chief Complaint Chief Complaint  Patient presents with  . Weakness    HPI [address] Dennis Sandoval is a 77 y.o. male.   77 yo male with dementia presents with sons who brought patient in from nursing facility due to patient appearing to be weak today.  Patient's sons state that in the past patient has had UTIs with similar presenting initial symptoms. No vomiting, fevers, diarrhea, constipation or other signs or symptoms.   The history is provided by a relative.  Weakness     Past Medical History:  Diagnosis Date  . Alzheimer disease 06/16/2015  . Dementia   . Diabetes mellitus without complication (HCC)   . Hypertension   . UTI (urinary tract infection)     Patient Active Problem List   Diagnosis Date Noted  . Altered mental status 06/07/2018  . UTI (urinary tract infection) 06/05/2018  . Sepsis (HCC) 03/27/2018  . 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 History of BPH 12/21/2016  . Medication monitoring encounter 01/28/2016  . Hyperglycemia 01/28/2016  . Preventative health care 12/21/2015  . Need for pneumococcal vaccination 12/21/2015  . Screening for AAA (abdominal aortic aneurysm) 12/01/2015  . Smoking history 11/28/2015  . Dementia 07/25/2015  . Benign hypertension 07/25/2015    Past Surgical History:  Procedure Laterality Date  . PROSTATE SURGERY         Home Medications    Prior to Admission medications   Medication Sig Start Date End Date Taking? Authorizing Provider  Cranberry 500 MG CAPS Take 500 mg by mouth daily.    [provider]  divalproex (DEPAKOTE SPRINKLE) 125 MG capsule Take 125 mg by mouth every 12 (twelve) hours.     [provider]  donepezil (ARICEPT) 10 MG tablet Take 1 tablet (10 mg total) by mouth at bedtime. Patient taking differently: Take 10 mg by mouth daily.  01/14/16   Lada,  Janit BernMelinda P, MD  escitalopram (LEXAPRO) 10 MG tablet Take 0.5 tablets (5 mg total) by mouth daily. (for mood) Patient taking differently: Take 10 mg by mouth daily.  01/14/16   Lada, Janit BernMelinda P, MD  memantine (NAMENDA) 5 MG tablet Take 1 tablet (5 mg total) by mouth 2 (two) times daily. 04/14/16   Kerman PasseyLada, Melinda P, MD  metFORMIN (GLUCOPHAGE) 500 MG tablet Take 1 tablet (500 mg total) by mouth 2 (two) times daily with a meal. 06/08/18 07/08/18  Milagros LollSudini, Srikar, MD  OLANZapine zydis (ZYPREXA) 5 MG disintegrating tablet Take 5 mg by mouth at bedtime.    [provider]  traZODone (DESYREL) 50 MG tablet Take 1 tablet (50 mg total) by mouth at bedtime as needed for sleep. Patient taking differently: Take 50 mg by mouth at bedtime.  03/29/18   Enedina FinnerPatel, Sona, MD    Family History Family History  Problem Relation Age of Onset  . Dementia Mother   . Dementia Father   . Cancer Brother        not sure what type  . Alcohol abuse Brother   . Heart disease Neg Hx   . Diabetes Neg Hx   . Stroke Neg Hx     Social History Social History   Tobacco Use  . Smoking status: Former Smoker    Packs/day: 0.50    Years: 50.00    Pack years: 25.00    Types: Cigarettes  Last attempt to quit: 11/27/1985    Years since quitting: 32.7  . Smokeless tobacco: Never Used  Substance Use Topics  . Alcohol use: No  . Drug use: No     Allergies   Patient has no known allergies.   Review of Systems Review of Systems  Neurological: Positive for weakness.     Physical Exam Triage Vital Signs ED Triage Vitals  Enc Vitals Group     BP 08/02/18 1330 137/69     Pulse Rate 08/02/18 1330 75     Resp 08/02/18 1330 18     Temp 08/02/18 1330 97.7 F (36.5 C)     Temp Source 08/02/18 1330 Oral     SpO2 08/02/18 1330 97 %     Weight --      Height 08/02/18 1332 5\' 10"  (1.778 m)     Head Circumference --      Peak Flow --      Pain Score 08/02/18 1332 0     Pain Loc --      Pain Edu? --      Excl. in GC?  --    No data found.  Updated Vital Signs BP 137/69 (BP Location: Right Arm)   Pulse 75   Temp 97.7 F (36.5 C) (Oral)   Resp 18   Ht 5\' 10"  (1.778 m)   SpO2 97%   BMI 27.51 kg/m   Visual Acuity Right Eye Distance:   Left Eye Distance:   Bilateral Distance:    Right Eye Near:   Left Eye Near:    Bilateral Near:     Physical Exam  Constitutional: He appears well-developed and well-nourished. No distress.  HENT:  Head: Normocephalic and atraumatic.  Right Ear: Tympanic membrane, external ear and ear canal normal.  Left Ear: Tympanic membrane, external ear and ear canal normal.  Nose: Rhinorrhea present.  Mouth/Throat: Uvula is midline, oropharynx is clear and moist and mucous membranes are normal. No oropharyngeal exudate or tonsillar abscesses.  Eyes: Pupils are equal, round, and reactive to light. Conjunctivae and EOM are normal. Right eye exhibits no discharge. Left eye exhibits no discharge. No scleral icterus.  Neck: Normal range of motion. Neck supple. No tracheal deviation present. No thyromegaly present.  Cardiovascular: Normal rate, regular rhythm and normal heart sounds.  Pulmonary/Chest: Effort normal and breath sounds normal. No stridor. No respiratory distress. He has no wheezes. He has no rales. He exhibits no tenderness.  Abdominal: Soft. Bowel sounds are normal.  Lymphadenopathy:    He has no cervical adenopathy.  Neurological: He is alert. No cranial nerve deficit.  Skin: Skin is warm and dry. No rash noted. He is not diaphoretic.  Nursing note and vitals reviewed.    UC Treatments / Results  Labs (all labs ordered are listed, but only abnormal results are displayed) Labs Reviewed  URINALYSIS, COMPLETE (UACMP) WITH MICROSCOPIC - Abnormal; Notable for the following components:      Result Value   Glucose, UA 100 (*)    Hgb urine dipstick MODERATE (*)    All other components within normal limits  GLUCOSE, CAPILLARY - Abnormal; Notable for the  following components:   Glucose-Capillary 151 (*)    All other components within normal limits  CBC WITH DIFFERENTIAL/PLATELET - Abnormal; Notable for the following components:   Hemoglobin 12.7 (*)    HCT 39.9 (*)    MCHC 31.9 (*)    RDW 19.8 (*)    All other components within normal  limits  COMPREHENSIVE METABOLIC PANEL - Abnormal; Notable for the following components:   Glucose, Bld 162 (*)    Calcium 8.8 (*)    AST 12 (*)    All other components within normal limits  CBG MONITORING, ED    EKG None  Radiology No results found.  Procedures Procedures (including critical care time)  Medications Ordered in UC Medications - No data to display  Initial Impression / Assessment and Plan / UC Course  I have reviewed the triage vital signs and the nursing notes.  Pertinent labs & imaging results that were available during my care of the patient were reviewed by me and considered in my medical decision making (see chart for details).      Final Clinical Impressions(s) / UC Diagnoses   Final diagnoses:  Generalized weakness     Discharge Instructions     Recommend increase fluids Monitor and follow up if worsening symptoms or new symptoms develop    ED Prescriptions    None      1. Lab results and diagnosis reviewed with family (sons) 2. Patient given po fluids while in clinic and patient appeared to feel stronger, more alert 3. Recommend increasing po fluids and monitoring 4. Follow-up prn if symptoms worsen or don't improve   Controlled Substance Prescriptions [place] Controlled Substance Registry consulted? Not Applicable   Payton Mccallum, MD 08/02/18 [phone]

## 2018-08-21 DIAGNOSIS — E119 Type 2 diabetes mellitus without complications: Secondary | ICD-10-CM | POA: Diagnosis not present

## 2018-08-21 DIAGNOSIS — R269 Unspecified abnormalities of gait and mobility: Secondary | ICD-10-CM | POA: Diagnosis not present

## 2018-08-21 DIAGNOSIS — I10 Essential (primary) hypertension: Secondary | ICD-10-CM | POA: Diagnosis not present

## 2018-08-31 DIAGNOSIS — R350 Frequency of micturition: Secondary | ICD-10-CM | POA: Diagnosis not present

## 2018-08-31 DIAGNOSIS — E119 Type 2 diabetes mellitus without complications: Secondary | ICD-10-CM | POA: Diagnosis not present

## 2018-08-31 DIAGNOSIS — I10 Essential (primary) hypertension: Secondary | ICD-10-CM | POA: Diagnosis not present

## 2018-09-04 DIAGNOSIS — Z79899 Other long term (current) drug therapy: Secondary | ICD-10-CM | POA: Diagnosis not present

## 2018-09-11 DIAGNOSIS — I10 Essential (primary) hypertension: Secondary | ICD-10-CM | POA: Diagnosis not present

## 2018-09-11 DIAGNOSIS — R269 Unspecified abnormalities of gait and mobility: Secondary | ICD-10-CM | POA: Diagnosis not present

## 2018-09-11 DIAGNOSIS — E119 Type 2 diabetes mellitus without complications: Secondary | ICD-10-CM | POA: Diagnosis not present

## 2018-09-18 DIAGNOSIS — R269 Unspecified abnormalities of gait and mobility: Secondary | ICD-10-CM | POA: Diagnosis not present

## 2018-09-18 DIAGNOSIS — E559 Vitamin D deficiency, unspecified: Secondary | ICD-10-CM | POA: Diagnosis not present

## 2018-09-18 DIAGNOSIS — I10 Essential (primary) hypertension: Secondary | ICD-10-CM | POA: Diagnosis not present

## 2018-09-18 DIAGNOSIS — E119 Type 2 diabetes mellitus without complications: Secondary | ICD-10-CM | POA: Diagnosis not present

## 2018-09-18 DIAGNOSIS — Z79899 Other long term (current) drug therapy: Secondary | ICD-10-CM | POA: Diagnosis not present

## 2018-09-19 DIAGNOSIS — I10 Essential (primary) hypertension: Secondary | ICD-10-CM | POA: Diagnosis not present

## 2018-09-19 DIAGNOSIS — M1990 Unspecified osteoarthritis, unspecified site: Secondary | ICD-10-CM | POA: Diagnosis not present

## 2018-09-19 DIAGNOSIS — E559 Vitamin D deficiency, unspecified: Secondary | ICD-10-CM | POA: Diagnosis not present

## 2018-09-19 DIAGNOSIS — G309 Alzheimer's disease, unspecified: Secondary | ICD-10-CM | POA: Diagnosis not present

## 2018-09-19 DIAGNOSIS — E119 Type 2 diabetes mellitus without complications: Secondary | ICD-10-CM | POA: Diagnosis not present

## 2018-09-19 DIAGNOSIS — Z87440 Personal history of urinary (tract) infections: Secondary | ICD-10-CM | POA: Diagnosis not present

## 2018-09-19 DIAGNOSIS — Z9181 History of falling: Secondary | ICD-10-CM | POA: Diagnosis not present

## 2018-09-22 DIAGNOSIS — I10 Essential (primary) hypertension: Secondary | ICD-10-CM | POA: Diagnosis not present

## 2018-09-22 DIAGNOSIS — Z87440 Personal history of urinary (tract) infections: Secondary | ICD-10-CM | POA: Diagnosis not present

## 2018-09-22 DIAGNOSIS — G309 Alzheimer's disease, unspecified: Secondary | ICD-10-CM | POA: Diagnosis not present

## 2018-09-22 DIAGNOSIS — Z9181 History of falling: Secondary | ICD-10-CM | POA: Diagnosis not present

## 2018-09-22 DIAGNOSIS — M1990 Unspecified osteoarthritis, unspecified site: Secondary | ICD-10-CM | POA: Diagnosis not present

## 2018-09-22 DIAGNOSIS — E119 Type 2 diabetes mellitus without complications: Secondary | ICD-10-CM | POA: Diagnosis not present

## 2018-09-22 DIAGNOSIS — E559 Vitamin D deficiency, unspecified: Secondary | ICD-10-CM | POA: Diagnosis not present

## 2018-09-27 ENCOUNTER — Other Ambulatory Visit: Payer: Self-pay

## 2018-09-27 DIAGNOSIS — Z87440 Personal history of urinary (tract) infections: Secondary | ICD-10-CM | POA: Diagnosis not present

## 2018-09-27 DIAGNOSIS — M1990 Unspecified osteoarthritis, unspecified site: Secondary | ICD-10-CM | POA: Diagnosis not present

## 2018-09-27 DIAGNOSIS — G309 Alzheimer's disease, unspecified: Secondary | ICD-10-CM | POA: Diagnosis not present

## 2018-09-27 DIAGNOSIS — I10 Essential (primary) hypertension: Secondary | ICD-10-CM | POA: Diagnosis not present

## 2018-09-27 DIAGNOSIS — Z9181 History of falling: Secondary | ICD-10-CM | POA: Diagnosis not present

## 2018-09-27 DIAGNOSIS — E119 Type 2 diabetes mellitus without complications: Secondary | ICD-10-CM | POA: Diagnosis not present

## 2018-09-27 DIAGNOSIS — E559 Vitamin D deficiency, unspecified: Secondary | ICD-10-CM | POA: Diagnosis not present

## 2018-09-27 NOTE — Patient Outreach (Signed)
[facility]) Care Management  09/27/2018  Dennis MiresSt Clair LealmanWinstead 10/16/1941 [id number]   Medication Adherence call to Mr. [name] patient did not answer patient is due on Metformin 500 mg  Under [facility] Ins.   Dennis AbedAna Sandoval CPhT Pharmacy Technician [facility] Care Management Direct Dial [phone]  Fax [phone] [email or web address]

## 2018-09-28 DIAGNOSIS — E1159 Type 2 diabetes mellitus with other circulatory complications: Secondary | ICD-10-CM | POA: Diagnosis not present

## 2018-09-28 DIAGNOSIS — E785 Hyperlipidemia, unspecified: Secondary | ICD-10-CM | POA: Diagnosis not present

## 2018-09-28 DIAGNOSIS — E559 Vitamin D deficiency, unspecified: Secondary | ICD-10-CM | POA: Diagnosis not present

## 2018-09-29 DIAGNOSIS — Z9181 History of falling: Secondary | ICD-10-CM | POA: Diagnosis not present

## 2018-09-29 DIAGNOSIS — M1990 Unspecified osteoarthritis, unspecified site: Secondary | ICD-10-CM | POA: Diagnosis not present

## 2018-09-29 DIAGNOSIS — G309 Alzheimer's disease, unspecified: Secondary | ICD-10-CM | POA: Diagnosis not present

## 2018-09-29 DIAGNOSIS — E119 Type 2 diabetes mellitus without complications: Secondary | ICD-10-CM | POA: Diagnosis not present

## 2018-09-29 DIAGNOSIS — Z87440 Personal history of urinary (tract) infections: Secondary | ICD-10-CM | POA: Diagnosis not present

## 2018-09-29 DIAGNOSIS — E559 Vitamin D deficiency, unspecified: Secondary | ICD-10-CM | POA: Diagnosis not present

## 2018-09-29 DIAGNOSIS — I10 Essential (primary) hypertension: Secondary | ICD-10-CM | POA: Diagnosis not present

## 2018-10-02 DIAGNOSIS — E119 Type 2 diabetes mellitus without complications: Secondary | ICD-10-CM | POA: Diagnosis not present

## 2018-10-02 DIAGNOSIS — I10 Essential (primary) hypertension: Secondary | ICD-10-CM | POA: Diagnosis not present

## 2018-10-02 DIAGNOSIS — Z9181 History of falling: Secondary | ICD-10-CM | POA: Diagnosis not present

## 2018-10-02 DIAGNOSIS — E559 Vitamin D deficiency, unspecified: Secondary | ICD-10-CM | POA: Diagnosis not present

## 2018-10-02 DIAGNOSIS — G309 Alzheimer's disease, unspecified: Secondary | ICD-10-CM | POA: Diagnosis not present

## 2018-10-02 DIAGNOSIS — M1990 Unspecified osteoarthritis, unspecified site: Secondary | ICD-10-CM | POA: Diagnosis not present

## 2018-10-02 DIAGNOSIS — Z87440 Personal history of urinary (tract) infections: Secondary | ICD-10-CM | POA: Diagnosis not present

## 2018-10-03 DIAGNOSIS — E559 Vitamin D deficiency, unspecified: Secondary | ICD-10-CM | POA: Diagnosis not present

## 2018-10-03 DIAGNOSIS — G309 Alzheimer's disease, unspecified: Secondary | ICD-10-CM | POA: Diagnosis not present

## 2018-10-03 DIAGNOSIS — I10 Essential (primary) hypertension: Secondary | ICD-10-CM | POA: Diagnosis not present

## 2018-10-03 DIAGNOSIS — E119 Type 2 diabetes mellitus without complications: Secondary | ICD-10-CM | POA: Diagnosis not present

## 2018-10-03 DIAGNOSIS — Z87440 Personal history of urinary (tract) infections: Secondary | ICD-10-CM | POA: Diagnosis not present

## 2018-10-03 DIAGNOSIS — M1990 Unspecified osteoarthritis, unspecified site: Secondary | ICD-10-CM | POA: Diagnosis not present

## 2018-10-03 DIAGNOSIS — Z9181 History of falling: Secondary | ICD-10-CM | POA: Diagnosis not present

## 2018-10-03 DIAGNOSIS — B351 Tinea unguium: Secondary | ICD-10-CM | POA: Diagnosis not present

## 2018-10-05 DIAGNOSIS — Z9181 History of falling: Secondary | ICD-10-CM | POA: Diagnosis not present

## 2018-10-05 DIAGNOSIS — M1990 Unspecified osteoarthritis, unspecified site: Secondary | ICD-10-CM | POA: Diagnosis not present

## 2018-10-05 DIAGNOSIS — I10 Essential (primary) hypertension: Secondary | ICD-10-CM | POA: Diagnosis not present

## 2018-10-05 DIAGNOSIS — E559 Vitamin D deficiency, unspecified: Secondary | ICD-10-CM | POA: Diagnosis not present

## 2018-10-05 DIAGNOSIS — Z87440 Personal history of urinary (tract) infections: Secondary | ICD-10-CM | POA: Diagnosis not present

## 2018-10-05 DIAGNOSIS — G309 Alzheimer's disease, unspecified: Secondary | ICD-10-CM | POA: Diagnosis not present

## 2018-10-05 DIAGNOSIS — E119 Type 2 diabetes mellitus without complications: Secondary | ICD-10-CM | POA: Diagnosis not present

## 2018-10-09 DIAGNOSIS — Z9181 History of falling: Secondary | ICD-10-CM | POA: Diagnosis not present

## 2018-10-09 DIAGNOSIS — M1990 Unspecified osteoarthritis, unspecified site: Secondary | ICD-10-CM | POA: Diagnosis not present

## 2018-10-09 DIAGNOSIS — E119 Type 2 diabetes mellitus without complications: Secondary | ICD-10-CM | POA: Diagnosis not present

## 2018-10-09 DIAGNOSIS — Z87440 Personal history of urinary (tract) infections: Secondary | ICD-10-CM | POA: Diagnosis not present

## 2018-10-09 DIAGNOSIS — E559 Vitamin D deficiency, unspecified: Secondary | ICD-10-CM | POA: Diagnosis not present

## 2018-10-09 DIAGNOSIS — G309 Alzheimer's disease, unspecified: Secondary | ICD-10-CM | POA: Diagnosis not present

## 2018-10-09 DIAGNOSIS — I10 Essential (primary) hypertension: Secondary | ICD-10-CM | POA: Diagnosis not present

## 2018-10-11 DIAGNOSIS — M1990 Unspecified osteoarthritis, unspecified site: Secondary | ICD-10-CM | POA: Diagnosis not present

## 2018-10-11 DIAGNOSIS — Z87440 Personal history of urinary (tract) infections: Secondary | ICD-10-CM | POA: Diagnosis not present

## 2018-10-11 DIAGNOSIS — I10 Essential (primary) hypertension: Secondary | ICD-10-CM | POA: Diagnosis not present

## 2018-10-11 DIAGNOSIS — Z9181 History of falling: Secondary | ICD-10-CM | POA: Diagnosis not present

## 2018-10-11 DIAGNOSIS — G309 Alzheimer's disease, unspecified: Secondary | ICD-10-CM | POA: Diagnosis not present

## 2018-10-11 DIAGNOSIS — E119 Type 2 diabetes mellitus without complications: Secondary | ICD-10-CM | POA: Diagnosis not present

## 2018-10-11 DIAGNOSIS — E559 Vitamin D deficiency, unspecified: Secondary | ICD-10-CM | POA: Diagnosis not present

## 2018-10-18 DIAGNOSIS — Z87440 Personal history of urinary (tract) infections: Secondary | ICD-10-CM | POA: Diagnosis not present

## 2018-10-18 DIAGNOSIS — Z9181 History of falling: Secondary | ICD-10-CM | POA: Diagnosis not present

## 2018-10-18 DIAGNOSIS — E119 Type 2 diabetes mellitus without complications: Secondary | ICD-10-CM | POA: Diagnosis not present

## 2018-10-18 DIAGNOSIS — M1990 Unspecified osteoarthritis, unspecified site: Secondary | ICD-10-CM | POA: Diagnosis not present

## 2018-10-18 DIAGNOSIS — E559 Vitamin D deficiency, unspecified: Secondary | ICD-10-CM | POA: Diagnosis not present

## 2018-10-18 DIAGNOSIS — G309 Alzheimer's disease, unspecified: Secondary | ICD-10-CM | POA: Diagnosis not present

## 2018-10-18 DIAGNOSIS — I10 Essential (primary) hypertension: Secondary | ICD-10-CM | POA: Diagnosis not present

## 2018-10-26 ENCOUNTER — Other Ambulatory Visit: Payer: Self-pay

## 2018-10-26 NOTE — Patient Outreach (Signed)
[facility]) Care Management  10/26/2018  Alene MiresSt Clair [place] 08/06/1941 [id number]   Medication Adherence call to Mr. [address] Sherron MondayClair Cassady spoke with patient's daughter she said patient is at a nursing home at this time and nursing home  provide all his medication patient is showing past due on Metformin 500 mg under [facility] Care Ins.   Lillia AbedAna Ollison-Moran CPhT Pharmacy Technician [facility] Care Management Direct Dial [phone]  Fax [phone] [email or web address]

## 2018-10-30 DIAGNOSIS — E1159 Type 2 diabetes mellitus with other circulatory complications: Secondary | ICD-10-CM | POA: Diagnosis not present

## 2018-10-30 DIAGNOSIS — I10 Essential (primary) hypertension: Secondary | ICD-10-CM | POA: Diagnosis not present

## 2018-10-30 DIAGNOSIS — F5109 Other insomnia not due to a substance or known physiological condition: Secondary | ICD-10-CM | POA: Diagnosis not present

## 2018-11-11 ENCOUNTER — Other Ambulatory Visit: Payer: Self-pay

## 2018-11-11 ENCOUNTER — Inpatient Hospital Stay
Admission: EM | Admit: 2018-11-11 | Discharge: 2018-11-12 | DRG: 392 | Disposition: A | Discharge status: Nursing Home (Includes ICF) | Payer: Medicare Other | Attending: Specialist | Admitting: Specialist

## 2018-11-11 ENCOUNTER — Emergency Department: Payer: Medicare Other

## 2018-11-11 DIAGNOSIS — Z7984 Long term (current) use of oral hypoglycemic drugs: Secondary | ICD-10-CM | POA: Diagnosis not present

## 2018-11-11 DIAGNOSIS — E785 Hyperlipidemia, unspecified: Secondary | ICD-10-CM | POA: Diagnosis present

## 2018-11-11 DIAGNOSIS — Z87891 Personal history of nicotine dependence: Secondary | ICD-10-CM | POA: Diagnosis not present

## 2018-11-11 DIAGNOSIS — G309 Alzheimer's disease, unspecified: Secondary | ICD-10-CM | POA: Diagnosis present

## 2018-11-11 DIAGNOSIS — K529 Noninfective gastroenteritis and colitis, unspecified: Secondary | ICD-10-CM | POA: Diagnosis not present

## 2018-11-11 DIAGNOSIS — Z79899 Other long term (current) drug therapy: Secondary | ICD-10-CM

## 2018-11-11 DIAGNOSIS — F0280 Dementia in other diseases classified elsewhere without behavioral disturbance: Secondary | ICD-10-CM | POA: Diagnosis present

## 2018-11-11 DIAGNOSIS — I10 Essential (primary) hypertension: Secondary | ICD-10-CM | POA: Diagnosis present

## 2018-11-11 DIAGNOSIS — R410 Disorientation, unspecified: Secondary | ICD-10-CM

## 2018-11-11 DIAGNOSIS — R531 Weakness: Secondary | ICD-10-CM | POA: Diagnosis not present

## 2018-11-11 DIAGNOSIS — A419 Sepsis, unspecified organism: Secondary | ICD-10-CM | POA: Diagnosis present

## 2018-11-11 DIAGNOSIS — Z87440 Personal history of urinary (tract) infections: Secondary | ICD-10-CM | POA: Diagnosis not present

## 2018-11-11 DIAGNOSIS — F329 Major depressive disorder, single episode, unspecified: Secondary | ICD-10-CM | POA: Diagnosis present

## 2018-11-11 DIAGNOSIS — G9340 Encephalopathy, unspecified: Secondary | ICD-10-CM | POA: Diagnosis present

## 2018-11-11 DIAGNOSIS — E119 Type 2 diabetes mellitus without complications: Secondary | ICD-10-CM | POA: Diagnosis present

## 2018-11-11 DIAGNOSIS — R0902 Hypoxemia: Secondary | ICD-10-CM | POA: Diagnosis not present

## 2018-11-11 DIAGNOSIS — R509 Fever, unspecified: Secondary | ICD-10-CM | POA: Diagnosis not present

## 2018-11-11 DIAGNOSIS — R4020 Unspecified coma: Secondary | ICD-10-CM | POA: Diagnosis not present

## 2018-11-11 DIAGNOSIS — J9811 Atelectasis: Secondary | ICD-10-CM | POA: Diagnosis not present

## 2018-11-11 LAB — COMPREHENSIVE METABOLIC PANEL
ALK PHOS: 52 U/L (ref 38–126)
ALT: 12 U/L (ref 0–44)
AST: 18 U/L (ref 15–41)
Albumin: 3.3 g/dL — ABNORMAL LOW (ref 3.5–5.0)
Anion gap: 7 (ref 5–15)
BILIRUBIN TOTAL: 0.8 mg/dL (ref 0.3–1.2)
BUN: 20 mg/dL (ref 8–23)
CALCIUM: 7.9 mg/dL — AB (ref 8.9–10.3)
CO2: 26 mmol/L (ref 22–32)
Chloride: 108 mmol/L (ref 98–111)
Creatinine, Ser: 1.18 mg/dL (ref 0.61–1.24)
GFR calc Af Amer: >60 mL/min (ref >60)
GFR calc non Af Amer: 59 mL/min — ABNORMAL LOW (ref >60)
Glucose, Bld: 132 mg/dL — ABNORMAL HIGH (ref 70–99)
Potassium: 3.8 mmol/L (ref 3.5–5.1)
Sodium: 141 mmol/L (ref 135–145)
Total Protein: 6.8 g/dL (ref 6.5–8.1)

## 2018-11-11 LAB — URINALYSIS, COMPLETE (UACMP) WITH MICROSCOPIC
BACTERIA UA: NONE SEEN
Bilirubin Urine: NEGATIVE
Glucose, UA: NEGATIVE mg/dL
Ketones, ur: NEGATIVE mg/dL
Leukocytes, UA: NEGATIVE
Nitrite: NEGATIVE
PROTEIN: NEGATIVE mg/dL
Specific Gravity, Urine: 1.025 (ref 1.005–1.030)
pH: 5 (ref 5.0–8.0)

## 2018-11-11 LAB — CBC WITH DIFFERENTIAL/PLATELET
Abs Immature Granulocytes: 0.03 10*3/uL (ref 0.00–0.07)
BASOS ABS: 0 10*3/uL (ref 0.0–0.1)
Basophils Relative: 0 %
Eosinophils Absolute: 0 10*3/uL (ref 0.0–0.5)
Eosinophils Relative: 0 %
HCT: 42.3 % (ref 39.0–52.0)
Hemoglobin: 13.8 g/dL (ref 13.0–17.0)
Immature Granulocytes: 0 %
Lymphocytes Relative: 8 %
Lymphs Abs: 0.7 10*3/uL (ref 0.7–4.0)
MCH: 26.1 pg (ref 26.0–34.0)
MCHC: 32.6 g/dL (ref 30.0–36.0)
MCV: 80.1 fL (ref 80.0–100.0)
Monocytes Absolute: 0.9 10*3/uL (ref 0.1–1.0)
Monocytes Relative: 10 %
NEUTROS ABS: 7.6 10*3/uL (ref 1.7–7.7)
NEUTROS PCT: 82 %
NRBC: 0 % (ref 0.0–0.2)
Platelets: 242 10*3/uL (ref 150–400)
RBC: 5.28 MIL/uL (ref 4.22–5.81)
RDW: 18.7 % — ABNORMAL HIGH (ref 11.5–15.5)
WBC: 9.3 10*3/uL (ref 4.0–10.5)

## 2018-11-11 LAB — GLUCOSE, CAPILLARY
Glucose-Capillary: 150 mg/dL — ABNORMAL HIGH (ref 70–99)
Glucose-Capillary: 102 mg/dL — ABNORMAL HIGH (ref 70–99)
Glucose-Capillary: 130 mg/dL — ABNORMAL HIGH (ref 70–99)

## 2018-11-11 LAB — ACETAMINOPHEN LEVEL

## 2018-11-11 LAB — CG4 I-STAT (LACTIC ACID): Lactic Acid, Venous: 2.71 mmol/L (ref 0.5–1.9)

## 2018-11-11 LAB — AMMONIA: AMMONIA: 35 umol/L (ref 9–35)

## 2018-11-11 LAB — TROPONIN I: Troponin I: <0.03 ng/mL (ref <0.03)

## 2018-11-11 LAB — MRSA PCR SCREENING: MRSA by PCR: NEGATIVE

## 2018-11-11 LAB — LACTIC ACID, PLASMA: Lactic Acid, Venous: 3 mmol/L (ref 0.5–1.9)

## 2018-11-11 LAB — LIPASE, BLOOD: Lipase: 24 U/L (ref 11–51)

## 2018-11-11 LAB — INFLUENZA PANEL BY PCR (TYPE A & B)
Influenza A By PCR: NEGATIVE
Influenza B By PCR: NEGATIVE

## 2018-11-11 LAB — ETHANOL

## 2018-11-11 MED ORDER — DONEPEZIL HCL 5 MG PO TABS
10.0000 mg | ORAL_TABLET | Freq: Every day | ORAL | Status: DC
  Administered 2018-11-12: 09:00:00 10 mg via ORAL
  Filled 2018-11-11: qty 2

## 2018-11-11 MED ORDER — ENOXAPARIN SODIUM 40 MG/0.4ML ~~LOC~~ SOLN
40.0000 mg | SUBCUTANEOUS | Status: DC
  Administered 2018-11-11: 40 mg via SUBCUTANEOUS
  Filled 2018-11-11: qty 0.4

## 2018-11-11 MED ORDER — VITAMIN D3 25 MCG (1000 UNIT) PO TABS
2000.0000 [IU] | ORAL_TABLET | Freq: Every day | ORAL | Status: DC
  Administered 2018-11-12: 09:00:00 2000 [IU] via ORAL
  Filled 2018-11-11: qty 2

## 2018-11-11 MED ORDER — SODIUM CHLORIDE 0.9 % IV BOLUS
500.0000 mL | Freq: Once | INTRAVENOUS | Status: AC
  Administered 2018-11-11: 500 mL via INTRAVENOUS

## 2018-11-11 MED ORDER — ESCITALOPRAM OXALATE 10 MG PO TABS
5.0000 mg | ORAL_TABLET | Freq: Every day | ORAL | Status: DC
  Administered 2018-11-12: 5 mg via ORAL
  Filled 2018-11-11: qty 0.5

## 2018-11-11 MED ORDER — ONDANSETRON HCL 4 MG/2ML IJ SOLN: 4.0000 mg | Freq: Four times a day (QID) | INTRAMUSCULAR | Status: DC | PRN

## 2018-11-11 MED ORDER — TRAZODONE HCL 50 MG PO TABS
50.0000 mg | ORAL_TABLET | Freq: Every day | ORAL | Status: DC
  Administered 2018-11-11: 23:00:00 50 mg via ORAL
  Filled 2018-11-11: qty 1

## 2018-11-11 MED ORDER — ACETAMINOPHEN 650 MG RE SUPP: 650.0000 mg | Freq: Four times a day (QID) | RECTAL | Status: DC | PRN

## 2018-11-11 MED ORDER — CRANBERRY 500 MG PO CAPS: 500.0000 mg | ORAL_CAPSULE | Freq: Every day | ORAL | Status: DC

## 2018-11-11 MED ORDER — ATORVASTATIN CALCIUM 20 MG PO TABS: 10.0000 mg | ORAL_TABLET | Freq: Every day | ORAL | Status: DC

## 2018-11-11 MED ORDER — MEMANTINE HCL 5 MG PO TABS
5.0000 mg | ORAL_TABLET | Freq: Two times a day (BID) | ORAL | Status: DC
  Administered 2018-11-11 – 2018-11-12 (×2): 5 mg via ORAL
  Filled 2018-11-11 (×2): qty 1

## 2018-11-11 MED ORDER — OLANZAPINE 5 MG PO TBDP
5.0000 mg | ORAL_TABLET | Freq: Every day | ORAL | Status: DC
  Administered 2018-11-11: 23:00:00 5 mg via ORAL
  Filled 2018-11-11 (×2): qty 1

## 2018-11-11 MED ORDER — INSULIN ASPART 100 UNIT/ML ~~LOC~~ SOLN: 0.0000 [IU] | Freq: Three times a day (TID) | SUBCUTANEOUS | Status: DC

## 2018-11-11 MED ORDER — DIVALPROEX SODIUM 125 MG PO CSDR
125.0000 mg | DELAYED_RELEASE_CAPSULE | Freq: Two times a day (BID) | ORAL | Status: DC
  Administered 2018-11-11 – 2018-11-12 (×2): 125 mg via ORAL
  Filled 2018-11-11 (×3): qty 1

## 2018-11-11 MED ORDER — SODIUM CHLORIDE 0.9 % IV SOLN
INTRAVENOUS | Status: DC
  Administered 2018-11-11: 1000 mL via INTRAVENOUS
  Administered 2018-11-12: 11:00:00 via INTRAVENOUS

## 2018-11-11 MED ORDER — INSULIN ASPART 100 UNIT/ML ~~LOC~~ SOLN: 0.0000 [IU] | Freq: Every day | SUBCUTANEOUS | Status: DC

## 2018-11-11 MED ORDER — ONDANSETRON HCL 4 MG PO TABS: 4.0000 mg | ORAL_TABLET | Freq: Four times a day (QID) | ORAL | Status: DC | PRN

## 2018-11-11 MED ORDER — ACETAMINOPHEN 325 MG PO TABS: 650.0000 mg | ORAL_TABLET | Freq: Four times a day (QID) | ORAL | Status: DC | PRN

## 2018-11-11 NOTE — ED Notes (Signed)
Attempt to call report X 1 unsuccessful.  

## 2018-11-11 NOTE — H&P (Signed)
[facility] [place] [facility]   PATIENT NAME: Dennis ScoreSt Clair Sandoval    MR#:  [id number]  DATE OF BIRTH:  05/19/1941  DATE OF ADMISSION:  11/11/2018  PRIMARY CARE PHYSICIAN: [facility]   REQUESTING/REFERRING PHYSICIAN: Dr. Ileana RoupJames McShane  CHIEF COMPLAINT:   Chief Complaint  Patient presents with  . Altered Mental Status    HISTORY OF PRESENT ILLNESS:  Dennis Eda KeysClair Tal  is a 77 y.o. male with a known history of dementia presents with altered mental status.  Patient unable to give any history.  As per family the patient was not responding as usual at Christmas.  At his facility the neurovirus has been going around.  He vomited yesterday.  Questionable diarrhea.  Usually the patient is talkative and able to get around with some help.  Currently just lying there.  Hospitalist services were contacted for further evaluation.  Patient having a fever.   PAST MEDICAL HISTORY:   Past Medical History:  Diagnosis Date  . Alzheimer disease (HCC) 06/16/2015  . Dementia (HCC)   . Diabetes mellitus without complication (HCC)   . Hypertension   . UTI (urinary tract infection)     PAST SURGICAL HISTORY:   Past Surgical History:  Procedure Laterality Date  . PROSTATE SURGERY      SOCIAL HISTORY:   Social History   Tobacco Use  . Smoking status: Former Smoker    Packs/day: 0.50    Years: 50.00    Pack years: 25.00    Types: Cigarettes    Last attempt to quit: 11/27/1985    Years since quitting: 32.9  . Smokeless tobacco: Never Used  Substance Use Topics  . Alcohol use: No    FAMILY HISTORY:   Family History  Problem Relation Age of Onset  . Dementia Mother   . Dementia Father   . Cancer Brother        not sure what type  . Alcohol abuse Brother   . Heart disease Neg Hx   . Diabetes Neg Hx   . Stroke Neg Hx     DRUG ALLERGIES:  No Known Allergies  REVIEW OF SYSTEMS:  Unable to provide review of systems secondary to  dementia  MEDICATIONS AT HOME:   Prior to Admission medications   Medication Sig Start Date End Date Taking? Authorizing Provider  atorvastatin (LIPITOR) 10 MG tablet Take 10 mg by mouth daily.   Yes [provider]  Cholecalciferol (VITAMIN D) 50 MCG (2000 UT) tablet Take 2,000 Units by mouth daily.   Yes [provider]  Cranberry 500 MG CAPS Take 500 mg by mouth daily.   Yes [provider]  divalproex (DEPAKOTE SPRINKLE) 125 MG capsule Take 125 mg by mouth every 12 (twelve) hours.    Yes [provider]  donepezil (ARICEPT) 10 MG tablet Take 1 tablet (10 mg total) by mouth at bedtime. Patient taking differently: Take 10 mg by mouth daily.  01/14/16  Yes Lada, Janit BernMelinda P, MD  escitalopram (LEXAPRO) 10 MG tablet Take 0.5 tablets (5 mg total) by mouth daily. (for mood) Patient taking differently: Take 10 mg by mouth daily. (for mood) 01/14/16  Yes Lada, Janit BernMelinda P, MD  memantine (NAMENDA) 5 MG tablet Take 1 tablet (5 mg total) by mouth 2 (two) times daily. 04/14/16  Yes Lada, Janit BernMelinda P, MD  metFORMIN (GLUCOPHAGE) 500 MG tablet Take 1 tablet (500 mg total) by mouth 2 (two) times daily with a meal. 06/08/18 11/11/18 Yes  Sudini, Srikar, MD  OLANZapine zydis (ZYPREXA) 5 MG disintegrating tablet Take 5 mg by mouth at bedtime.   Yes [provider]  traZODone (DESYREL) 50 MG tablet Take 1 tablet (50 mg total) by mouth at bedtime as needed for sleep. Patient taking differently: Take 50 mg by mouth at bedtime.  03/29/18  Yes [name], MD      VITAL SIGNS:  Blood pressure 120/88, pulse 99, temperature (!) 100.4 F (38 C), temperature source Rectal, resp. rate 16, height 5\' 10"  (1.778 m), weight 87 kg, SpO2 99 %.  PHYSICAL EXAMINATION:  GENERAL:  77 y.o.-year-old patient lying in the bed with no acute distress.  EYES: Pupils equal, round, reactive to light and accommodation. No scleral icterus. HEENT: Head atraumatic, normocephalic. Oropharynx and  nasopharynx clear.  NECK:  Supple, no jugular venous distention. No thyroid enlargement, no tenderness.  LUNGS: Normal breath sounds bilaterally, no wheezing, rales,rhonchi or crepitation. No use of accessory muscles of respiration.  CARDIOVASCULAR: S1, S2 tachycardic. No murmurs, rubs, or gallops.  ABDOMEN: Soft, nontender, nondistended. Bowel sounds present. No organomegaly or mass.  EXTREMITIES: No pedal edema, cyanosis, or clubbing.  NEUROLOGIC: Patient opens eyes. PSYCHIATRIC: The patient is alert but not following commands or talking.Marland Kitchen  SKIN: No rash, lesion, or ulcer.   LABORATORY PANEL:   CBC Recent Labs  Lab 11/11/18 1105  WBC 9.3  HGB 13.8  HCT 42.3  PLT 242   ------------------------------------------------------------------------------------------------------------------  Chemistries  No results for input(s): NA, K, CL, CO2, GLUCOSE, BUN, CREATININE, CALCIUM, MG, AST, ALT, ALKPHOS, BILITOT in the last 168 hours.  Invalid input(s): GFRCGP ------------------------------------------------------------------------------------------------------------------  Cardiac Enzymes Recent Labs  Lab 11/11/18 1105  TROPONINI <0.03   ------------------------------------------------------------------------------------------------------------------  RADIOLOGY:  Ct Head Wo Contrast  Result Date: 11/11/2018 CLINICAL DATA:  Patient with vomiting and diarrhea. EXAM: CT HEAD WITHOUT CONTRAST TECHNIQUE: Contiguous axial images were obtained from the base of the skull through the vertex without intravenous contrast. COMPARISON:  Brain CT 06/05/2018 FINDINGS: Brain: Ventricles and sulci are prominent compatible with atrophy. Periventricular and subcortical white matter hypodensity compatible with chronic microvascular ischemic changes. 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 No evidence for acute cortically based infarct, intracranial hemorrhage, mass lesion or mass-effect. Vascular:  Unremarkable Skull: Intact. Sinuses/Orbits: Paranasal sinuses are well aerated. Remote right medial orbital wall fracture. Mastoid air cells unremarkable. Orbits unremarkable. Other: None. IMPRESSION: No acute intracranial process. Electronically Signed   By: Annia Belt M.D.   On: 11/11/2018 12:01   Dg Chest Port 1 View  Result Date: 11/11/2018 CLINICAL DATA:  Unresponsive to commands, dementia. HTN, confusion No heart/lung conditions per chart EXAM: PORTABLE CHEST 1 VIEW COMPARISON:  06/27/2018 and earlier FINDINGS: Shallow lung inflation. Stable elevation of the LEFT hemidiaphragm. There is minimal subsegmental atelectasis at both lung bases. There is perihilar peribronchial thickening. No pulmonary edema. IMPRESSION: 1. Shallow inflation. 2. Bronchitic changes.  Bibasilar atelectasis. Electronically Signed   By: Norva Pavlov M.D.   On: 11/11/2018 12:12      IMPRESSION AND PLAN:   1.  Fever, acute encephalopathy.  Will give IV fluid hydration.  Send off stool studies if has any further diarrhea.  Supportive care for now.  Follow-up cultures. 2.  I ordered a CMP since none was done. 3.  Dementia.  CODE STATUS discussed and family wants him to be a full code.  Continue Aricept and Namenda.  Continue other psychiatric medications 4.  Type 2 diabetes mellitus.  Hold oral medications and start on sliding scale.  5.  Essential hypertension continue antihypertensives tomorrow. 6.  Hyperlipidemia unspecified on Lipitor. 7.  Lactic acidosis.  Repeat lactate.  IV fluid hydration.  Hold Glucophage.  This could be secondary to Glucophage or infection.  All the records are reviewed and case discussed with ED provider. Management plans discussed with the patient, family and they are in agreement.  CODE STATUS: Full code  TOTAL TIME TAKING CARE OF THIS PATIENT: 50 minutes, including acp time.    Alford Highlandichard Miya Luviano M.D on 11/11/2018 at 1:21 PM  Between 7am to 6pm - Pager - [phone]  After 6pm  call admission pager [phone]  [facility] Office  [phone]  CC: Primary care physician; [facility]

## 2018-11-11 NOTE — ED Notes (Signed)
Patient transported to CT 

## 2018-11-11 NOTE — Clinical Social Work Note (Signed)
Clinical Social Work Assessment  Patient Details  Name: Dennis Sandoval MRN: [id number] Date of Birth: 10/20/1941  Date of referral:  11/11/18               Reason for consult:                   Permission sought to share information with:    Permission granted to share information::  Yes, Verbal Permission Granted  Name::     Laban EmperorDarrell and Odette HornsMichael Gutierrez [phone]  Agency::  [facility]  Relationship::     Contact Information:     Housing/Transportation Living arrangements for the past 2 months:  Skilled Nursing Facility([facility]) Source of Information:  Patient, Adult Children Patient Interpreter Needed:  None Criminal Activity/Legal Involvement Pertinent to Current Situation/Hospitalization:  No - Comment as needed Significant Relationships:  Adult Children, Spouse Lives with:  Facility Resident Do you feel safe going back to the place where you live?  Yes Need for family participation in patient care:  Yes (Comment)  Care giving concerns:  Sons are very active in his care.   Social Worker assessment / plan: LCSW dressed up ( Precautions taken) Patient was asleep and this worker was unable to assess patient. He is a 77 year old male who is diagnosed with Alzheimer and [facility].  They are married for 54 years. He needs assistance with his ADLs. He is diabetic, and is able to walk. He is incontinent. LCSW spoke to patients son and he is to return once he is well and DC.  Employment status:  Retired Web designernsurance information:  Managed Medicare(United health Care) PT Recommendations:  Not assessed at this time Information / Referral to community resources:    None required Patient/Family's Response to care:  Good understanding  Patient/Family's Understanding of and Emotional Response to Diagnosis, Current Treatment, and Prognosis: Good understanding and very involved  family  Emotional Assessment Appearance:  Appears stated age Attitude/Demeanor/Rapport:  Lethargic, Unable to Assess Affect (typically observed):  Accepting, Unable to Assess Orientation:  Oriented to Self Alcohol / Substance use:  Not Applicable Psych involvement (Current and /or in the community):  No (Comment)  Discharge Needs  Concerns to be addressed:  Care Coordination Readmission within the last 30 days:  No Current discharge risk:  None Barriers to Discharge:  Continued Medical Work up   Cheron SchaumannBandi, Koven Belinsky M, LCSW 11/11/2018, 5:35 PM

## 2018-11-11 NOTE — Progress Notes (Signed)
PHARMACIST - PHYSICIAN ORDER COMMUNICATION  CONCERNING: P&T Medication Policy on Herbal Medications  DESCRIPTION:  This patient's order for:  Cranberry Caps  has been noted.  This product(s) is classified as an "herbal" or natural product. Due to a lack of definitive safety studies or FDA approval, nonstandard manufacturing practices, plus the potential risk of unknown drug-drug interactions while on inpatient medications, the Pharmacy and Therapeutics Committee does not permit the use of "herbal" or natural products of this type within [facility].   ACTION TAKEN: The pharmacy department is unable to verify this order at this time. Please reevaluate patient's clinical condition at discharge and address if the herbal or natural product(s) should be resumed at that time.  Waldron LabsKaren K Sheehan Stacey, RPh 11/11/2018 2:08 PM

## 2018-11-11 NOTE — Progress Notes (Signed)
Patient ID: Dennis Sandoval, male   DOB: 06/27/1941, 77 y.o.   MRN: [id number]  ACP note  Patient unable to participate in conversation.  2 sons at the bedside.  Diagnosis:Fever, acute encephalopathy, dementia, type 2 diabetes, essential hypertension, hyperlipidemia, lactic acidosis.  CODE STATUS discussed and they wish the patient to be a full code.  Time spent on ACP discussion 17 minutes Dr. Alford Highlandichard Cailan Antonucci

## 2018-11-11 NOTE — Clinical Social Work Note (Signed)
CSW is aware via chart review that this [facility]. CSW will assess once he has been bedded and when able.  Argentina PonderKaren Martha Benito Lemmerman, MSW, Theresia MajorsLCSWA [phone]

## 2018-11-11 NOTE — ED Triage Notes (Signed)
Pt comes EMS [facility]. Pt has been vomitting/diarrhea for two days. 132/88 with EMS and CBG 176. Norovirus going aruond nursing home. Pt reacting to pain/loud voice.

## 2018-11-11 NOTE — [place] FL2 (Addendum)
[place] MEDICAID FL2 LEVEL OF CARE SCREENING TOOL     IDENTIFICATION  Patient Name: Dennis Sandoval Birthdate: 03/02/1941 Sex: male Admission Date (Current Location): 11/11/2018  [place]   Facility and Address:  [facility], [address], [place], [place] [address]      Provider Number: [id number]  Attending Physician Name and Address:  Alford HighlandWieting, Richard, MD  Relative Name and Phone Number:   Juanito DoomDarrell Bench  [phone]  Current Level of Care: Hospital Recommended Level of Care: Memory Care Prior Approval Number:    Date Approved/Denied:   PASRR Number:    Discharge Plan: Other (Comment)([facility])    Current Diagnoses: Patient Active Problem List   Diagnosis Date Noted  . Altered mental status 06/07/2018  . UTI (urinary tract infection) 06/05/2018  . Sepsis (HCC) 03/27/2018  . 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 History of BPH 12/21/2016  . Medication monitoring encounter 01/28/2016  . Hyperglycemia 01/28/2016  . Preventative health care 12/21/2015  . Need for pneumococcal vaccination 12/21/2015  . Screening for AAA (abdominal aortic aneurysm) 12/01/2015  . Smoking history 11/28/2015  . Dementia (HCC) 07/25/2015  . Benign hypertension 07/25/2015    Orientation RESPIRATION BLADDER Height & Weight     Self  Normal Incontinent Weight: 191 lb 12.8 oz (87 kg) Height:  5\' 10"  (177.8 cm)  BEHAVIORAL SYMPTOMS/MOOD NEUROLOGICAL BOWEL NUTRITION STATUS      Incontinent Diet(Diabetic)   Nectar-thick liquid;Dysphagia 3 (Mech soft)      AMBULATORY STATUS COMMUNICATION OF NEEDS Skin   Independent Verbally Normal                       Personal Care Assistance Level of Assistance  Bathing, Feeding, Dressing, Total care Bathing Assistance: Limited assistance Feeding assistance: Independent Dressing Assistance: Limited assistance Total  Care Assistance: Limited assistance   Functional Limitations Info  Sight, Hearing, Speech Sight Info: Adequate Hearing Info: Adequate Speech Info: Adequate([place])    SPECIAL CARE FACTORS FREQUENCY                       Contractures Contractures Info: Not present    Additional Factors Info                   Current Medications (11/11/2018):  This is the current hospital active medication list Current Facility-Administered Medications  Medication Dose Route Frequency Provider Last Rate Last Dose  . 0.9 %  sodium chloride infusion   Intravenous Continuous Alford HighlandWieting, Richard, MD 50 mL/hr at 11/11/18 1600    . acetaminophen (TYLENOL) tablet 650 mg  650 mg Oral Q6H PRN Alford HighlandWieting, Richard, MD       Or  . acetaminophen (TYLENOL) suppository 650 mg  650 mg Rectal Q6H PRN Alford HighlandWieting, Richard, MD      . Melene Muller[START ON 11/12/2018] atorvastatin (LIPITOR) tablet 10 mg  10 mg Oral q1800 Alford HighlandWieting, Richard, MD      . Melene Muller[START ON 11/12/2018] cholecalciferol (VITAMIN D) tablet 2,000 Units  2,000 Units Oral Daily Wieting, Richard, MD      . divalproex (DEPAKOTE SPRINKLE) capsule 125 mg  125 mg Oral Q12H Wieting, Richard, MD      . Melene Muller[START ON 11/12/2018] donepezil (ARICEPT) tablet 10 mg  10 mg Oral Daily Wieting, Richard, MD      . enoxaparin (LOVENOX) injection 40 mg  40 mg Subcutaneous Q24H Alford HighlandWieting, Richard, MD      . [  START ON 11/12/2018] escitalopram (LEXAPRO) tablet 5 mg  5 mg Oral Daily Wieting, Richard, MD      . insulin aspart (novoLOG) injection 0-5 Units  0-5 Units Subcutaneous QHS Wieting, Richard, MD      . insulin aspart (novoLOG) injection 0-9 Units  0-9 Units Subcutaneous TID WC Wieting, Richard, MD      . memantine [facility](NAMENDA) tablet 5 mg  5 mg Oral BID Wieting, Richard, MD      . OLANZapine zydis (ZYPREXA) disintegrating tablet 5 mg  5 mg Oral QHS Wieting, Richard, MD      . ondansetron [facility]ZOFRAN) tablet 4 mg  4 mg Oral Q6H PRN Wieting, Richard, MD       Or  . ondansetron (ZOFRAN)  injection 4 mg  4 mg Intravenous Q6H PRN Wieting, Richard, MD      . traZODone (DESYREL) tablet 50 mg  50 mg Oral QHS Wieting, Richard, MD         Discharge Medications: Medication List    TAKE these medications   atorvastatin 10 MG tablet Commonly known as:  LIPITOR Take 10 mg by mouth daily.   Cranberry 500 MG Caps Take 500 mg by mouth daily.   divalproex 125 MG capsule Commonly known as:  DEPAKOTE SPRINKLE Take 125 mg by mouth every 12 (twelve) hours.   donepezil 10 MG tablet Commonly known as:  ARICEPT Take 1 tablet (10 mg total) by mouth at bedtime. What changed:  when to take this   escitalopram 10 MG tablet Commonly known as:  LEXAPRO Take 0.5 tablets (5 mg total) by mouth daily. (for mood) What changed:  how much to take   memantine 5 MG tablet Commonly known as:  NAMENDA Take 1 tablet (5 mg total) by mouth 2 (two) times daily.   metFORMIN 500 MG tablet Commonly known as:  GLUCOPHAGE Take 1 tablet (500 mg total) by mouth 2 (two) times daily with a meal.   OLANZapine zydis 5 MG disintegrating tablet Commonly known as:  ZYPREXA Take 5 mg by mouth at bedtime.   traZODone 50 MG tablet Commonly known as:  DESYREL Take 1 tablet (50 mg total) by mouth at bedtime as needed for sleep. What changed:  when to take this   Vitamin D 50 MCG (2000 UT) tablet Take 2,000 Units by mouth daily.     Relevant Imaging Results:  Relevant Lab Results:   Additional Information    Johnella MoloneyBandi, [place] M, [place]

## 2018-11-11 NOTE — Progress Notes (Signed)
Pt now more alert, verbal. Drank juice, water and ate yogurt for supper with assistance and tolerated so far.

## 2018-11-11 NOTE — Progress Notes (Signed)
Progress Notes  Signed  Date of Service:  11/11/2018 4:49 PM          Signed         Show:Clear all [x] Manual[] Template[] Copied  Added by: [x] Belva BertinHodge, Kinjal Neitzke W, RN  [] Hover for details Pt admitted from ED with assistance of son. Pt was nonverbal with staff. IVF's continued with plan of care in process. Awaiting stool for collection. MRSA PCR obtained and sent to lab. Pt resting quietly with eyes closed. Pt reports pt normally ambulates with 1+ without equipment.

## 2018-11-11 NOTE — ED Notes (Signed)
Date and time results received: 11/11/18 2:16 PM  Test: lactic acid Critical Value: 3.0  Name of Provider Notified: Dr. Hilton SinclairWeiting

## 2018-11-11 NOTE — ED Notes (Signed)
Pt unable to take PO at this time due to lack of following commands. Pt responds to voice and pain at this time. Is not alert. Pt does not make any words at this time.

## 2018-11-11 NOTE — ED Provider Notes (Addendum)
[facility] Emergency Department Provider Note  ____________________________________________   I have reviewed the triage vital signs and the nursing notes. Where available I have reviewed prior notes and, if possible and indicated, outside hospital notes.    HISTORY  Chief Complaint Altered Mental Status    HPI [name] is a 77 y.o. male  history of frequent urinary tract infections, Alzheimer's disease, dementia, diabetes mellitus, presents today with decreased mental status and vomiting according to the nursing home.  Very limited history available.  Patient himself cannot give a history history as per EMS.  No fever known and normal sugars on the way in.  Level 5 chart caveat; no further history available due to patient status.       Past Medical History:  Diagnosis Date  . Alzheimer disease (HCC) 06/16/2015  . Dementia (HCC)   . Diabetes mellitus without complication (HCC)   . Hypertension   . UTI (urinary tract infection)     Patient Active Problem List   Diagnosis Date Noted  . Altered mental status 06/07/2018  . UTI (urinary tract infection) 06/05/2018  . Sepsis (HCC) 03/27/2018  . 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 History of BPH 12/21/2016  . Medication monitoring encounter 01/28/2016  . Hyperglycemia 01/28/2016  . Preventative health care 12/21/2015  . Need for pneumococcal vaccination 12/21/2015  . Screening for AAA (abdominal aortic aneurysm) 12/01/2015  . Smoking history 11/28/2015  . Dementia (HCC) 07/25/2015  . Benign hypertension 07/25/2015    Past Surgical History:  Procedure Laterality Date  . PROSTATE SURGERY      Prior to Admission medications   Medication Sig Start Date End Date Taking? Authorizing Provider  Cranberry 500 MG CAPS Take 500 mg by mouth daily.    [provider]  divalproex (DEPAKOTE SPRINKLE) 125 MG capsule Take 125 mg by mouth  every 12 (twelve) hours.     [provider]  donepezil (ARICEPT) 10 MG tablet Take 1 tablet (10 mg total) by mouth at bedtime. Patient taking differently: Take 10 mg by mouth daily.  01/14/16   Lada, Janit Bern, MD  escitalopram (LEXAPRO) 10 MG tablet Take 0.5 tablets (5 mg total) by mouth daily. (for mood) Patient taking differently: Take 10 mg by mouth daily.  01/14/16   Lada, Janit Bern, MD  memantine (NAMENDA) 5 MG tablet Take 1 tablet (5 mg total) by mouth 2 (two) times daily. 04/14/16   Kerman Passey, MD  metFORMIN (GLUCOPHAGE) 500 MG tablet Take 1 tablet (500 mg total) by mouth 2 (two) times daily with a meal. 06/08/18 07/08/18  Milagros Loll, MD  OLANZapine zydis (ZYPREXA) 5 MG disintegrating tablet Take 5 mg by mouth at bedtime.    [provider]  traZODone (DESYREL) 50 MG tablet Take 1 tablet (50 mg total) by mouth at bedtime as needed for sleep. Patient taking differently: Take 50 mg by mouth at bedtime.  03/29/18   [name], MD    Allergies Patient has no known allergies.  Family History  Problem Relation Age of Onset  . Dementia Mother   . Dementia Father   . Cancer Brother        not sure what type  . Alcohol abuse Brother   . Heart disease Neg Hx   . Diabetes Neg Hx   . Stroke Neg Hx     Social History Social History   Tobacco Use  . Smoking status: Former Smoker  Packs/day: 0.50    Years: 50.00    Pack years: 25.00    Types: Cigarettes    Last attempt to quit: 11/27/1985    Years since quitting: 32.9  . Smokeless tobacco: Never Used  Substance Use Topics  . Alcohol use: No  . Drug use: No    Review of Systems Level 5 chart caveat; no further history available due to patient status.  ____________________________________________   PHYSICAL EXAM:  VITAL SIGNS: ED Triage Vitals  Enc Vitals Group     BP 11/11/18 1100 106/72     Pulse Rate 11/11/18 1100 (!) 114     Resp 11/11/18 1100 18     Temp 11/11/18 1100 98.8 F (37.1 C)      Temp Source 11/11/18 1100 Oral     SpO2 11/11/18 1100 97 %     Weight 11/11/18 1058 191 lb 12.8 oz (87 kg)     Height 11/11/18 1058 5\' 10"  (1.778 m)     Head Circumference --      Peak Flow --      Pain Score --      Pain Loc --      Pain Edu? --      Excl. in GC? --     Constitutional: Somnolent but arousable to verbal and physical stimuli, will not follow commands. Eyes: Conjunctivae are normal Head: Atraumatic HEENT: No congestion/rhinnorhea. Mucous membranes are moist.  Oropharynx non-erythematous Neck:   Nontender with no meningismus, no masses, no stridor Cardiovascular: Normal rate, regular rhythm. Grossly normal heart sounds.  Good peripheral circulation. Respiratory: Normal respiratory effort.  No retractions. Lungs CTAB. Abdominal: Soft and nontender. No distention. No guarding no rebound Back:  There is no focal tenderness or step off.  there is no midline tenderness there are no lesions noted. there is no CVA tenderness Musculoskeletal: No lower extremity tenderness, no upper extremity tenderness. No joint effusions, no DVT signs strong distal pulses no edema Neurologic: Very limited exam no obvious focality Skin:  Skin is warm, dry and intact. No rash noted. .  ____________________________________________   LABS (all labs ordered are listed, but only abnormal results are displayed)  Labs Reviewed  GLUCOSE, CAPILLARY - Abnormal; Notable for the following components:      Result Value   Glucose-Capillary 150 (*)    All other components within normal limits  CBC WITH DIFFERENTIAL/PLATELET - Abnormal; Notable for the following components:   RDW 18.7 (*)    All other components within normal limits  ACETAMINOPHEN LEVEL - Abnormal; Notable for the following components:   Acetaminophen (Tylenol), Serum <10 (*)    All other components within normal limits  URINALYSIS, COMPLETE (UACMP) WITH MICROSCOPIC - Abnormal; Notable for the following components:   Color, Urine  YELLOW (*)    APPearance CLEAR (*)    Hgb urine dipstick SMALL (*)    All other components within normal limits  CG4 I-STAT (LACTIC ACID) - Abnormal; Notable for the following components:   Lactic Acid, Venous 2.71 (*)    All other components within normal limits  URINE CULTURE  CULTURE, BLOOD (ROUTINE X 2)  CULTURE, BLOOD (ROUTINE X 2)  AMMONIA  LIPASE, BLOOD  TROPONIN I  ETHANOL  INFLUENZA PANEL BY PCR (TYPE A & B)  CBG MONITORING, ED  I-STAT CG4 LACTIC ACID, ED    Pertinent labs  results that were available during my care of the patient were reviewed by me and considered in my medical decision making (see  chart for details). ____________________________________________  EKG  I personally interpreted any EKGs ordered by me or triage Sinus tach rate 115 normal axis no acute ST elevation or depression ____________________________________________  RADIOLOGY  Pertinent labs & imaging results that were available during my care of the patient were reviewed by me and considered in my medical decision making (see chart for details). If possible, patient and/or family made aware of any abnormal findings.  Ct Head Wo Contrast  Result Date: 11/11/2018 CLINICAL DATA:  Patient with vomiting and diarrhea. EXAM: CT HEAD WITHOUT CONTRAST TECHNIQUE: Contiguous axial images were obtained from the base of the skull through the vertex without intravenous contrast. COMPARISON:  Brain CT 06/05/2018 FINDINGS: Brain: Ventricles and sulci are prominent compatible with atrophy. Periventricular and subcortical white matter hypodensity compatible with chronic microvascular ischemic changes. 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 No evidence for acute cortically based infarct, intracranial hemorrhage, mass lesion or mass-effect. Vascular: Unremarkable Skull: Intact. Sinuses/Orbits: Paranasal sinuses are well aerated. Remote right medial orbital wall fracture. Mastoid air cells unremarkable. Orbits  unremarkable. Other: None. IMPRESSION: No acute intracranial process. Electronically Signed   By: Annia Belt M.D.   On: 11/11/2018 12:01   Dg Chest Port 1 View  Result Date: 11/11/2018 CLINICAL DATA:  Unresponsive to commands, dementia. HTN, confusion No heart/lung conditions per chart EXAM: PORTABLE CHEST 1 VIEW COMPARISON:  06/27/2018 and earlier FINDINGS: Shallow lung inflation. Stable elevation of the LEFT hemidiaphragm. There is minimal subsegmental atelectasis at both lung bases. There is perihilar peribronchial thickening. No pulmonary edema. IMPRESSION: 1. Shallow inflation. 2. Bronchitic changes.  Bibasilar atelectasis. Electronically Signed   By: Norva Pavlov M.D.   On: 11/11/2018 12:12   ____________________________________________    PROCEDURES  Procedure(s) performed: None  Procedures  Critical Care performed: None  ____________________________________________   INITIAL IMPRESSION / ASSESSMENT AND PLAN / ED COURSE  Pertinent labs & imaging results that were available during my care of the patient were reviewed by me and considered in my medical decision making (see chart for details).  Here with reported change in mental status and vomiting although is difficult to know he does suffer from severe dementia.  Blood work is reassuring he does have a little bit of a fever, and his lactic acid is trivially elevated.  We are giving him IV fluid, and I am checking the flu, as we do not have any obvious source.  Chest x-ray is no definitive infiltrate and his CT of the head is negative reassuringly.  We are going to continue to assess him here, family apparently were at bedside but no longer are I will see if I can get them on the phone and find out more from them about what they are seeing as the patient cannot give a history  ----------------------------------------- 12:37 PM on 11/11/2018 ----------------------------------------- Talk to the son, patient is in a facility  where there is significant norovirus, he has been having copious vomiting diarrhea apparently overnight."  He gets like this when he is sick".  He is diminished from his baseline I think he will require admission for hydration and observation.  Lactic acid was noted.  I do not think antibiotics are indicated because the only symptoms he has had his vomiting and diarrhea in the context of norovirus.  This could only make things worse.  Blood cultures are pending.  We are continue to hydrate him.  A repeat lactic was ordered for the hospitalist    ____________________________________________   FINAL CLINICAL IMPRESSION(S) /  ED DIAGNOSES  Final diagnoses:  Confusion      This chart was dictated using voice recognition software.  Despite best efforts to proofread,  errors can occur which can change meaning.       Jeanmarie PlantMcShane, James A, MD 11/11/18 1235    Jeanmarie PlantMcShane, James A, MD 11/11/18 1242    Jeanmarie PlantMcShane, James A, MD 11/11/18 [phone]

## 2018-11-11 NOTE — ED Notes (Signed)
Family will be in waiting room if anything needed.

## 2018-11-12 LAB — BASIC METABOLIC PANEL
Anion gap: 6 (ref 5–15)
BUN: 16 mg/dL (ref 8–23)
CO2: 24 mmol/L (ref 22–32)
Calcium: 7.6 mg/dL — ABNORMAL LOW (ref 8.9–10.3)
Chloride: 111 mmol/L (ref 98–111)
Creatinine, Ser: 0.92 mg/dL (ref 0.61–1.24)
GFR calc Af Amer: >60 mL/min (ref >60)
Glucose, Bld: 122 mg/dL — ABNORMAL HIGH (ref 70–99)
Potassium: 3.6 mmol/L (ref 3.5–5.1)
Sodium: 141 mmol/L (ref 135–145)

## 2018-11-12 LAB — CBC
HEMATOCRIT: 38.3 % — AB (ref 39.0–52.0)
HEMOGLOBIN: 12.1 g/dL — AB (ref 13.0–17.0)
MCH: 25.7 pg — ABNORMAL LOW (ref 26.0–34.0)
MCHC: 31.6 g/dL (ref 30.0–36.0)
MCV: 81.3 fL (ref 80.0–100.0)
Platelets: 187 10*3/uL (ref 150–400)
RBC: 4.71 MIL/uL (ref 4.22–5.81)
RDW: 18.8 % — ABNORMAL HIGH (ref 11.5–15.5)
WBC: 6.5 10*3/uL (ref 4.0–10.5)
nRBC: 0 % (ref 0.0–0.2)

## 2018-11-12 LAB — GLUCOSE, CAPILLARY
GLUCOSE-CAPILLARY: 97 mg/dL (ref 70–99)
Glucose-Capillary: 114 mg/dL — ABNORMAL HIGH (ref 70–99)

## 2018-11-12 LAB — URINE CULTURE: Culture: NO GROWTH

## 2018-11-12 LAB — LACTIC ACID, PLASMA: LACTIC ACID, VENOUS: 2 mmol/L — AB (ref 0.5–1.9)

## 2018-11-12 NOTE — Clinical Social Work Note (Signed)
The [facility]. The patient's son Laban EmperorDarrell and the facility are aware and in agreement. Darrell has asked that the RN contact him when his father is ready for transport.The CSW has sent documentation to the facility and will deliver the discharge packet as soon as possible. At that time, the CSW will sign off. Please consult should needs arise.  Argentina PonderKaren Martha Jayce Kainz, MSW, Theresia MajorsLCSWA [phone]

## 2018-11-12 NOTE — Progress Notes (Signed)
No n/v/d since admission, VSS. Alert, baseline pleasantly confused. Tolerating diet well. Ambulated with PT; up in chair.  Son coming to transport pt back to facility. Pt dressed and ready to go when family available for transport.

## 2018-11-12 NOTE — Discharge Summary (Addendum)
[facility] [place] [facility]   PATIENT NAME: Dennis Sandoval    MR#:  [id number]  DATE OF BIRTH:  02/27/1941  DATE OF ADMISSION:  11/11/2018 ADMITTING PHYSICIAN: Alford Highlandichard Wieting, MD  DATE OF DISCHARGE: 11/12/2018  PRIMARY CARE PHYSICIAN: [facility]    ADMISSION DIAGNOSIS:  Confusion [R41.0] Gastroenteritis [K52.9]  DISCHARGE DIAGNOSIS:  Active Problems:   Sepsis (HCC)   SECONDARY DIAGNOSIS:   Past Medical History:  Diagnosis Date  . Alzheimer disease (HCC) 06/16/2015  . Dementia (HCC)   . Diabetes mellitus without complication (HCC)   . Hypertension   . UTI (urinary tract infection)     HOSPITAL COURSE:   77 year old male with past medical history of Alzheimer's dementia, diabetes, hypertension, previous history of urinary tract infection who presented to the hospital secondary to altered mental status/encephalopathy.  1.  Altered mental status/encephalopathy-secondary to underlying dementia with possible underlying viral gastroenteritis.  Patient has clinically improved overnight after IV fluid hydration.  He is alert and follows commands is tolerating p.o. well.  2.  Gastroenteritis-this was suspected on admission.  Patient although has had no nausea vomiting or diarrhea while in the hospital.  We have not been able to collect any stool in the hospital. - Continue supportive care with antiemetics, patient is tolerating p.o. well.  Patient is afebrile and hemodynamically stable.  3.  History of dementia-patient will continue his Aricept and Namenda.  4.  History of depression-patient will continue his Lexapro.  5.  Diabetes type 2 without complication-patient will resume his metformin upon discharge.  6.  Hyperlipidemia-patient will continue his atorvastatin.  DISCHARGE CONDITIONS:   Stable  CONSULTS OBTAINED:    DRUG ALLERGIES:  No Known Allergies  DISCHARGE MEDICATIONS:   Allergies as of 11/12/2018   No Known  Allergies     Medication List    TAKE these medications   atorvastatin 10 MG tablet Commonly known as:  LIPITOR Take 10 mg by mouth daily.   Cranberry 500 MG Caps Take 500 mg by mouth daily.   divalproex 125 MG capsule Commonly known as:  DEPAKOTE SPRINKLE Take 125 mg by mouth every 12 (twelve) hours.   donepezil 10 MG tablet Commonly known as:  ARICEPT Take 1 tablet (10 mg total) by mouth at bedtime. What changed:  when to take this   escitalopram 10 MG tablet Commonly known as:  LEXAPRO Take 0.5 tablets (5 mg total) by mouth daily. (for mood) What changed:  how much to take   memantine 5 MG tablet Commonly known as:  NAMENDA Take 1 tablet (5 mg total) by mouth 2 (two) times daily.   metFORMIN 500 MG tablet Commonly known as:  GLUCOPHAGE Take 1 tablet (500 mg total) by mouth 2 (two) times daily with a meal.   OLANZapine zydis 5 MG disintegrating tablet Commonly known as:  ZYPREXA Take 5 mg by mouth at bedtime.   traZODone 50 MG tablet Commonly known as:  DESYREL Take 1 tablet (50 mg total) by mouth at bedtime as needed for sleep. What changed:  when to take this   Vitamin D 50 MCG (2000 UT) tablet Take 2,000 Units by mouth daily.         DISCHARGE INSTRUCTIONS:   DIET:  Cardiac diet and Diabetic diet Dysphagia 3 diet with nectar thick liquids and aspiration precautions.  DISCHARGE CONDITION:  Stable  ACTIVITY:  Activity as tolerated  OXYGEN:  Home Oxygen: No.   Oxygen Delivery: room air  DISCHARGE  LOCATION:  Assisted Living.   If you experience worsening of your admission symptoms, develop shortness of breath, life threatening emergency, suicidal or homicidal thoughts you must seek medical attention immediately by calling 911 or calling your MD immediately  if symptoms less severe.  You Must read complete instructions/literature along with all the possible adverse reactions/side effects for all the Medicines you take and that have been  prescribed to you. Take any new Medicines after you have completely understood and accpet all the possible adverse reactions/side effects.   Please note  You were cared for by a hospitalist during your hospital stay. If you have any questions about your discharge medications or the care you received while you were in the hospital after you are discharged, you can call the unit and asked to speak with the hospitalist on call if the hospitalist that took care of you is not available. Once you are discharged, your primary care physician will handle any further medical issues. Please note that NO REFILLS for any discharge medications will be authorized once you are discharged, as it is imperative that you return to your primary care physician (or establish a relationship with a primary care physician if you do not have one) for your aftercare needs so that they can reassess your need for medications and monitor your lab values.     Today   No N/V or diarrhea overnight.  Clinically stable and will d/c back to [facility] today.   VITAL SIGNS:  Blood pressure 117/88, pulse 76, temperature (!) 97.5 F (36.4 C), temperature source Oral, resp. rate 12, height 5\' 10"  (1.778 m), weight 87 kg, SpO2 98 %.  I/O:    Intake/Output Summary (Last 24 hours) at 11/12/2018 1341 Last data filed at 11/12/2018 1102 Gross per 24 hour  Intake 1491.95 ml  Output -  Net 1491.95 ml    PHYSICAL EXAMINATION:  GENERAL:  77 y.o.-year-old demented patient sitting up in chair following commands in no acute distress.  EYES: Pupils equal, round, reactive to light and accommodation. No scleral icterus. Extraocular muscles intact.  HEENT: Head atraumatic, normocephalic. Oropharynx and nasopharynx clear.  NECK:  Supple, no jugular venous distention. No thyroid enlargement, no tenderness.  LUNGS: Normal breath sounds bilaterally, no wheezing, rales,rhonchi. No use of accessory muscles of respiration.  CARDIOVASCULAR: S1,  S2 normal. No murmurs, rubs, or gallops.  ABDOMEN: Soft, non-tender, non-distended. Bowel sounds present. No organomegaly or mass.  EXTREMITIES: No pedal edema, cyanosis, or clubbing.  NEUROLOGIC: Cranial nerves II through XII are intact. No focal motor or sensory defecits b/l. Globally weak.  PSYCHIATRIC: The patient is alert and oriented x 1.   SKIN: No obvious rash, lesion, or ulcer.   DATA REVIEW:   CBC Recent Labs  Lab 11/12/18 0432  WBC 6.5  HGB 12.1*  HCT 38.3*  PLT 187    Chemistries  Recent Labs  Lab 11/11/18 1740 11/12/18 0432  NA 141 141  K 3.8 3.6  CL 108 111  CO2 26 24  GLUCOSE 132* 122*  BUN 20 16  CREATININE 1.18 0.92  CALCIUM 7.9* 7.6*  AST 18  --   ALT 12  --   ALKPHOS 52  --   BILITOT 0.8  --     Cardiac Enzymes Recent Labs  Lab 11/11/18 1105  TROPONINI <0.03    Microbiology Results  Results for orders placed or performed during the hospital encounter of 11/11/18  Culture, blood (routine x 2)     Status:  None (Preliminary result)   Collection Time: 11/11/18 11:05 AM  Result Value Ref Range Status   Specimen Description BLOOD RIGHT HAND  Final   Special Requests   Final    BOTTLES DRAWN AEROBIC AND ANAEROBIC Blood Culture adequate volume   Culture   Final    NO GROWTH < 24 HOURS Performed at [facility], [address], [place], [place] [address]    Report Status PENDING  Incomplete  Culture, blood (routine x 2)     Status: None (Preliminary result)   Collection Time: 11/11/18 11:49 AM  Result Value Ref Range Status   Specimen Description BLOOD LEFT ANTECUBITAL  Final   Special Requests   Final    BOTTLES DRAWN AEROBIC AND ANAEROBIC Blood Culture results may not be optimal due to an excessive volume of blood received in culture bottles   Culture   Final    NO GROWTH < 24 HOURS Performed at [facility], [address], [place], [place] [address]    Report Status PENDING  Incomplete  MRSA PCR Screening      Status: None   Collection Time: 11/11/18  3:45 PM  Result Value Ref Range Status   MRSA by PCR NEGATIVE NEGATIVE Final    Comment:        The GeneXpert MRSA Assay (FDA approved for NASAL specimens only), is one component of a comprehensive MRSA colonization surveillance program. It is not intended to diagnose MRSA infection nor to guide or monitor treatment for MRSA infections. Performed at [facility], [address] [place], [place] [address]     RADIOLOGY:  Ct Head Wo Contrast  Result Date: 11/11/2018 CLINICAL DATA:  Patient with vomiting and diarrhea. EXAM: CT HEAD WITHOUT CONTRAST TECHNIQUE: Contiguous axial images were obtained from the base of the skull through the vertex without intravenous contrast. COMPARISON:  Brain CT 06/05/2018 FINDINGS: Brain: Ventricles and sulci are prominent compatible with atrophy. Periventricular and subcortical white matter hypodensity compatible with chronic microvascular ischemic changes. 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 No evidence for acute cortically based infarct, intracranial hemorrhage, mass lesion or mass-effect. Vascular: Unremarkable Skull: Intact. Sinuses/Orbits: Paranasal sinuses are well aerated. Remote right medial orbital wall fracture. Mastoid air cells unremarkable. Orbits unremarkable. Other: None. IMPRESSION: No acute intracranial process. Electronically Signed   By: Annia Belt M.D.   On: 11/11/2018 12:01   Dg Chest Port 1 View  Result Date: 11/11/2018 CLINICAL DATA:  Unresponsive to commands, dementia. HTN, confusion No heart/lung conditions per chart EXAM: PORTABLE CHEST 1 VIEW COMPARISON:  06/27/2018 and earlier FINDINGS: Shallow lung inflation. Stable elevation of the LEFT hemidiaphragm. There is minimal subsegmental atelectasis at both lung bases. There is perihilar peribronchial thickening. No pulmonary edema. IMPRESSION: 1. Shallow inflation. 2. Bronchitic changes.  Bibasilar atelectasis. Electronically  Signed   By: Norva Pavlov M.D.   On: 11/11/2018 12:12      Management plans discussed with the patient, family and they are in agreement.  CODE STATUS:     Code Status Orders  (From admission, onward)         Start     Ordered   11/11/18 1320  Full code  Continuous     11/11/18 1320       TOTAL TIME TAKING CARE OF THIS PATIENT: 40 minutes.    Houston Siren M.D on 11/12/2018 at 1:41 PM  Between 7am to 6pm - Pager - [phone]  After 6pm go to [email or web address] - password [email or web address]  [employer] [facility]  Office  [phone]  CC: Primary care physician; [facility]

## 2018-11-12 NOTE — Evaluation (Signed)
Clinical/Bedside Swallow Evaluation Patient Details  Name: Dennis Sandoval MRN: [id number] Date of Birth: 05/09/1941  Today's Date: 11/12/2018 Time: SLP Start Time (ACUTE ONLY): 0815 SLP Stop Time (ACUTE ONLY): 0847 SLP Time Calculation (min) (ACUTE ONLY): 32 min  Past Medical History:  Past Medical History:  Diagnosis Date  . Alzheimer disease (HCC) 06/16/2015  . Dementia (HCC)   . Diabetes mellitus without complication (HCC)   . Hypertension   . UTI (urinary tract infection)    Past Surgical History:  Past Surgical History:  Procedure Laterality Date  . PROSTATE SURGERY     HPI:      Assessment / Plan / Recommendation Clinical Impression  pt was asleep upon st arrival, however easily awakened. pt presents with a moderate oral pharyngeal dysphagia as characterized by an inability to adequatly intake solids and thin liquids. pt was given trials of dysphagia, 1, 2 and 3 trials. pt was able to intake of solids with no ssx aspiration. pt was given trials of enctar thick liquid with no overt ssx aspiration. pt was given trials of thin liquid with noted throat clear and wet vocal quality post intake. pt did not attempt to independently cough post wet vocal qualtiy. pt was encouraged to cough but has decreased ability to follow commands.  SLP recommends to continue dysphagia 3 with nectar thick liquids at this time.  SLP Visit Diagnosis: Dysphagia, oropharyngeal phase (R13.12)    Aspiration Risk  Moderate aspiration risk    Diet Recommendation Nectar-thick liquid;Dysphagia 3 (Mech soft)   Liquid Administration via: Cup;No straw Medication Administration: Crushed with puree Supervision: Staff to assist with self feeding Compensations: Slow rate;Small sips/bites Postural Changes: Seated upright at 90 degrees    Other  Recommendations Oral Care Recommendations: Oral care BID Other Recommendations: Remove water pitcher   Follow up Recommendations        Frequency and Duration  min 3x week  2 weeks       Prognosis Prognosis for Safe Diet Advancement: Fair Barriers to Reach Goals: Cognitive deficits      Swallow Study   General Date of Onset: 11/11/18 Type of Study: Bedside Swallow Evaluation Diet Prior to this Study: Dysphagia 3 (soft);Nectar-thick liquids Temperature Spikes Noted: No Respiratory Status: Room air History of Recent Intubation: No Behavior/Cognition: Alert;Cooperative;Pleasant mood;Confused;Distractible;Requires cueing Oral Cavity Assessment: Within Functional Limits Oral Care Completed by SLP: Yes Oral Cavity - Dentition: Adequate natural dentition Vision: Functional for self-feeding Self-Feeding Abilities: Needs assist Patient Positioning: Upright in bed Baseline Vocal Quality: Normal Volitional Cough: Weak Volitional Swallow: Unable to elicit    Oral/Motor/Sensory Function Overall Oral Motor/Sensory Function: Within functional limits   Ice Chips Ice chips: Not tested   Thin Liquid Thin Liquid: Impaired Presentation: Cup Oral Phase Impairments: Reduced labial seal;Reduced lingual movement/coordination Oral Phase Functional Implications: Right anterior spillage;Left anterior spillage Pharyngeal  Phase Impairments: Suspected delayed Swallow;Wet Vocal Quality;Throat Clearing - Immediate    Nectar Thick Nectar Thick Liquid: Within functional limits Presentation: Cup;Spoon   Honey Thick Honey Thick Liquid: Not tested   Puree Puree: Within functional limits Presentation: Spoon   Solid     Solid: Impaired Presentation: Spoon Oral Phase Impairments: Impaired mastication Oral Phase Functional Implications: Oral residue;Oral holding;Impaired mastication;Prolonged oral transit Pharyngeal Phase Impairments: Suspected delayed Swallow      Stacie Harris Sauber 11/12/2018,9:20 AM

## 2018-11-12 NOTE — Progress Notes (Signed)
Son, Dennis Sandoval, here to [facility] with pt transported to private vehicle in transport chair. AVS packet sent with son after oral and written education.

## 2018-11-12 NOTE — Evaluation (Signed)
Physical Therapy Evaluation Patient Details Name: Dennis Sandoval MRN: [id number] DOB: 12/31/1940 Today's Date: 11/12/2018   History of Present Illness  77 y.o. male with a known history of dementia presents with altered mental status.  He is normally talkative and relatively active but had AMS at Memory care unit along with nausea and vomiting.    Clinical Impression  Pt generally did well with PT exam, he was able to answer some basic questions but did not know where he was, date, etc.  He was able to get to EOB with light extra cuing and walk to the door and back with a lot of VCs and light tactile guidance but little physical assist.  Unsure how this compares to his baseline, but it appears that he should be able to return to memory care unit once medically cleared, possible HHPT per comparison to baseline regarding mobility, etc and ability to participate; he does not appear to need STR.    Follow Up Recommendations (return memory care unit, HHPT? (per baseline comparison))    Equipment Recommendations  None recommended by PT    Recommendations for Other Services       Precautions / Restrictions Precautions Precautions: Fall Restrictions Weight Bearing Restrictions: No      Mobility  Bed Mobility Overal bed mobility: Modified Independent             General bed mobility comments: Pt needed only light tactile cuing to get to EOB  Transfers Overall transfer level: Needs assistance Equipment used: None Transfers: Sit to/from Stand Sit to Stand: Min guard         General transfer comment: Again light tactile cuing to assist with understanding what we were trying to do and to insure safety first time standing  Ambulation/Gait Ambulation/Gait assistance: Min guard Gait Distance (Feet): 35 Feet Assistive device: None       General Gait Details: Pt was able to ambulate with shuffling but relatively steady gait.  He needed constant cuing to stay on task but did  not have any LOBs or overt unsteadiness.    Stairs            Wheelchair Mobility    Modified Rankin (Stroke Patients Only)       Balance Overall balance assessment: Modified Independent                                           Pertinent Vitals/Pain Pain Assessment: No/denies pain    Home Living Family/patient expects to be discharged to:: (memory care unit)                 Additional Comments: [facility]    Prior Function Level of Independence: Independent               Hand Dominance        Extremity/Trunk Assessment   Upper Extremity Assessment Upper Extremity Assessment: Overall WFL for tasks assessed;Difficult to assess due to impaired cognition    Lower Extremity Assessment Lower Extremity Assessment: Overall WFL for tasks assessed;Difficult to assess due to impaired cognition       Communication   Communication: No difficulties(slow and limited response, but appropriate per baseline)  Cognition Arousal/Alertness: Awake/alert Behavior During Therapy: Flat affect Overall Cognitive Status: History of cognitive impairments - at baseline(unsure as to baseline, but able to interact reasonably well)  General Comments      Exercises     Assessment/Plan    PT Assessment Patient needs continued PT services  PT Problem List Decreased activity tolerance;Decreased balance;Decreased mobility;Decreased cognition;Decreased knowledge of use of DME;Decreased safety awareness;Decreased strength;Decreased range of motion       PT Treatment Interventions DME instruction;Gait training;Stair training;Functional mobility training;Therapeutic activities;Therapeutic exercise;Balance training;Neuromuscular re-education;Patient/family education;Cognitive remediation    PT Goals (Current goals can be found in the Care Plan section)  Acute Rehab PT Goals Patient Stated Goal:  none stated PT Goal Formulation: Patient unable to participate in goal setting Time For Goal Achievement: 11/26/18 Potential to Achieve Goals: Good    Frequency Min 2X/week   Barriers to discharge        Co-evaluation               AM-PAC PT "6 Clicks" Mobility  Outcome Measure Help needed turning from your back to your side while in a flat bed without using bedrails?: A Little Help needed moving from lying on your back to sitting on the side of a flat bed without using bedrails?: A Little Help needed moving to and from a bed to a chair (including a wheelchair)?: A Little Help needed standing up from a chair using your arms (e.g., wheelchair or bedside chair)?: A Little Help needed to walk in hospital room?: A Little Help needed climbing 3-5 steps with a railing? : A Little 6 Click Score: 18    End of Session Equipment Utilized During Treatment: Gait belt Activity Tolerance: Patient tolerated treatment well Patient left: with chair alarm set;with call bell/phone within reach Nurse Communication: Mobility status PT Visit Diagnosis: Muscle weakness (generalized) (M62.81);Difficulty in walking, not elsewhere classified (R26.2)    Time: [id number] PT Time Calculation (min) (ACUTE ONLY): 26 min   Charges:   PT Evaluation $PT Eval Low Complexity: 1 Low          Malachi ProGalen R Harland Aguiniga, DPT 11/12/2018, 1:24 PM

## 2018-11-13 DIAGNOSIS — E785 Hyperlipidemia, unspecified: Secondary | ICD-10-CM | POA: Diagnosis not present

## 2018-11-13 DIAGNOSIS — R350 Frequency of micturition: Secondary | ICD-10-CM | POA: Diagnosis not present

## 2018-11-13 DIAGNOSIS — E1159 Type 2 diabetes mellitus with other circulatory complications: Secondary | ICD-10-CM | POA: Diagnosis not present

## 2018-11-13 DIAGNOSIS — E559 Vitamin D deficiency, unspecified: Secondary | ICD-10-CM | POA: Diagnosis not present

## 2018-11-16 DIAGNOSIS — E119 Type 2 diabetes mellitus without complications: Secondary | ICD-10-CM | POA: Diagnosis not present

## 2018-11-16 DIAGNOSIS — E860 Dehydration: Secondary | ICD-10-CM | POA: Diagnosis not present

## 2018-11-16 LAB — CULTURE, BLOOD (ROUTINE X 2)
CULTURE: NO GROWTH
Culture: NO GROWTH
Special Requests: ADEQUATE

## 2018-11-30 ENCOUNTER — Encounter: Payer: Self-pay | Admitting: Emergency Medicine

## 2018-11-30 ENCOUNTER — Emergency Department: Payer: Medicare Other

## 2018-11-30 ENCOUNTER — Emergency Department
Admission: EM | Admit: 2018-11-30 | Discharge: 2018-11-30 | Disposition: A | Discharge status: Home / Self Care | Payer: Medicare Other | Attending: Emergency Medicine | Admitting: Emergency Medicine

## 2018-11-30 DIAGNOSIS — I10 Essential (primary) hypertension: Secondary | ICD-10-CM | POA: Diagnosis not present

## 2018-11-30 DIAGNOSIS — G309 Alzheimer's disease, unspecified: Secondary | ICD-10-CM | POA: Insufficient documentation

## 2018-11-30 DIAGNOSIS — E119 Type 2 diabetes mellitus without complications: Secondary | ICD-10-CM | POA: Insufficient documentation

## 2018-11-30 DIAGNOSIS — Z87891 Personal history of nicotine dependence: Secondary | ICD-10-CM | POA: Insufficient documentation

## 2018-11-30 DIAGNOSIS — J9811 Atelectasis: Secondary | ICD-10-CM | POA: Diagnosis not present

## 2018-11-30 DIAGNOSIS — R4182 Altered mental status, unspecified: Secondary | ICD-10-CM | POA: Insufficient documentation

## 2018-11-30 DIAGNOSIS — Z79899 Other long term (current) drug therapy: Secondary | ICD-10-CM | POA: Insufficient documentation

## 2018-11-30 DIAGNOSIS — R410 Disorientation, unspecified: Secondary | ICD-10-CM | POA: Diagnosis not present

## 2018-11-30 DIAGNOSIS — E1159 Type 2 diabetes mellitus with other circulatory complications: Secondary | ICD-10-CM | POA: Diagnosis not present

## 2018-11-30 DIAGNOSIS — Z7984 Long term (current) use of oral hypoglycemic drugs: Secondary | ICD-10-CM | POA: Insufficient documentation

## 2018-11-30 DIAGNOSIS — R400 Somnolence: Secondary | ICD-10-CM | POA: Diagnosis not present

## 2018-11-30 DIAGNOSIS — N3000 Acute cystitis without hematuria: Secondary | ICD-10-CM | POA: Diagnosis not present

## 2018-11-30 LAB — COMPREHENSIVE METABOLIC PANEL
ALT: 14 U/L (ref 0–44)
AST: 15 U/L (ref 15–41)
Albumin: 3.8 g/dL (ref 3.5–5.0)
Alkaline Phosphatase: 67 U/L (ref 38–126)
Anion gap: 10 (ref 5–15)
BUN: 13 mg/dL (ref 8–23)
CO2: 29 mmol/L (ref 22–32)
Calcium: 9.8 mg/dL (ref 8.9–10.3)
Chloride: 101 mmol/L (ref 98–111)
Creatinine, Ser: 1.12 mg/dL (ref 0.61–1.24)
GFR calc Af Amer: >60 mL/min (ref >60)
Glucose, Bld: 153 mg/dL — ABNORMAL HIGH (ref 70–99)
Potassium: 3.8 mmol/L (ref 3.5–5.1)
Sodium: 140 mmol/L (ref 135–145)
Total Bilirubin: 0.7 mg/dL (ref 0.3–1.2)
Total Protein: 7.9 g/dL (ref 6.5–8.1)

## 2018-11-30 LAB — CBC WITH DIFFERENTIAL/PLATELET
Abs Immature Granulocytes: 0.05 10*3/uL (ref 0.00–0.07)
Basophils Absolute: 0 10*3/uL (ref 0.0–0.1)
Basophils Relative: 1 %
Eosinophils Absolute: 0.3 10*3/uL (ref 0.0–0.5)
Eosinophils Relative: 3 %
HCT: 42.6 % (ref 39.0–52.0)
Hemoglobin: 13.2 g/dL (ref 13.0–17.0)
Immature Granulocytes: 1 %
Lymphocytes Relative: 16 %
Lymphs Abs: 1.3 10*3/uL (ref 0.7–4.0)
MCH: 26 pg (ref 26.0–34.0)
MCHC: 31 g/dL (ref 30.0–36.0)
MCV: 83.9 fL (ref 80.0–100.0)
MONO ABS: 0.8 10*3/uL (ref 0.1–1.0)
Monocytes Relative: 10 %
Neutro Abs: 6 10*3/uL (ref 1.7–7.7)
Neutrophils Relative %: 69 %
PLATELETS: 323 10*3/uL (ref 150–400)
RBC: 5.08 MIL/uL (ref 4.22–5.81)
RDW: 19 % — ABNORMAL HIGH (ref 11.5–15.5)
WBC: 8.5 10*3/uL (ref 4.0–10.5)
nRBC: 0 % (ref 0.0–0.2)

## 2018-11-30 LAB — URINALYSIS, COMPLETE (UACMP) WITH MICROSCOPIC
Bacteria, UA: NONE SEEN
Bilirubin Urine: NEGATIVE
Glucose, UA: 50 mg/dL — AB
HGB URINE DIPSTICK: NEGATIVE
Ketones, ur: 5 mg/dL — AB
Leukocytes, UA: NEGATIVE
NITRITE: NEGATIVE
Protein, ur: NEGATIVE mg/dL
Specific Gravity, Urine: 1.021 (ref 1.005–1.030)
pH: 6 (ref 5.0–8.0)

## 2018-11-30 MED ORDER — SODIUM CHLORIDE 0.9 % IV BOLUS
1000.0000 mL | Freq: Once | INTRAVENOUS | Status: AC
  Administered 2018-11-30: 1000 mL via INTRAVENOUS

## 2018-11-30 MED ORDER — SODIUM CHLORIDE 0.9% FLUSH: 3.0000 mL | Freq: Once | INTRAVENOUS | Status: DC

## 2018-11-30 NOTE — ED Notes (Signed)
Place on pressure bag per MD.

## 2018-11-30 NOTE — ED Triage Notes (Signed)
Pt coming from [facility] for AMS per [facility]. Pt has hx of recurrent UTIs and has just started a second round of antibiotics. Pt has fluctuating confusion per EMS. Pt was able eat lunch. Pt in NAD at this time and appears drowsy. VS stable at this time.

## 2018-11-30 NOTE — ED Provider Notes (Signed)
[facility] Emergency Department Provider Note  ____________________________________________  Time seen: Approximately 3:27 PM  I have reviewed the triage vital signs and the nursing notes.   HISTORY  Chief Complaint Altered Mental Status  Level 5 caveat:  Portions of the history and physical were unable to be obtained due to AMS   HPI Dennis Sandoval is a 78 y.o. male with a history of dementia, diabetes, hypertension, recurrent UTIs who presents for evaluation of altered mental status.  According to the nursing home patient has recurrent UTIs.  Has recently finished a course of antibiotics and was started on a second course today for recurrent positive UA.  Over the course of the day today patient has been more sleepy, less awake and more confused.  He has had no vomiting or diarrhea, no fever, no cough.  Patient denies any pain but other than that unable to answer any further questions.   Past Medical History:  Diagnosis Date  . Alzheimer disease (HCC) 06/16/2015  . Dementia (HCC)   . Diabetes mellitus without complication (HCC)   . Hypertension   . UTI (urinary tract infection)     Patient Active Problem List   Diagnosis Date Noted  . Altered mental status 06/07/2018  . UTI (urinary tract infection) 06/05/2018  . Sepsis (HCC) 03/27/2018  . 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 History of BPH 12/21/2016  . Medication monitoring encounter 01/28/2016  . Hyperglycemia 01/28/2016  . Preventative health care 12/21/2015  . Need for pneumococcal vaccination 12/21/2015  . Screening for AAA (abdominal aortic aneurysm) 12/01/2015  . Smoking history 11/28/2015  . Dementia (HCC) 07/25/2015  . Benign hypertension 07/25/2015    Past Surgical History:  Procedure Laterality Date  . PROSTATE SURGERY      Prior to Admission medications   Medication Sig Start Date End Date Taking? Authorizing Provider    atorvastatin (LIPITOR) 10 MG tablet Take 10 mg by mouth daily.    [provider]  Cholecalciferol (VITAMIN D) 50 MCG (2000 UT) tablet Take 2,000 Units by mouth daily.    [provider]  Cranberry 500 MG CAPS Take 500 mg by mouth daily.    [provider]  divalproex (DEPAKOTE SPRINKLE) 125 MG capsule Take 125 mg by mouth every 12 (twelve) hours.     [provider]  donepezil (ARICEPT) 10 MG tablet Take 1 tablet (10 mg total) by mouth at bedtime. Patient taking differently: Take 10 mg by mouth daily.  01/14/16   Lada, Janit Bern, MD  escitalopram (LEXAPRO) 10 MG tablet Take 0.5 tablets (5 mg total) by mouth daily. (for mood) Patient taking differently: Take 10 mg by mouth daily. (for mood) 01/14/16   Lada, Janit Bern, MD  memantine (NAMENDA) 5 MG tablet Take 1 tablet (5 mg total) by mouth 2 (two) times daily. 04/14/16   Kerman Passey, MD  metFORMIN (GLUCOPHAGE) 500 MG tablet Take 1 tablet (500 mg total) by mouth 2 (two) times daily with a meal. 06/08/18 11/11/18  Milagros Loll, MD  OLANZapine zydis (ZYPREXA) 5 MG disintegrating tablet Take 5 mg by mouth at bedtime.    [provider]  traZODone (DESYREL) 50 MG tablet Take 1 tablet (50 mg total) by mouth at bedtime as needed for sleep. Patient taking differently: Take 50 mg by mouth at bedtime.  03/29/18   [name], MD    Allergies Patient has no known allergies.  Family History  Problem  Relation Age of Onset  . Dementia Mother   . Dementia Father   . Cancer Brother        not sure what type  . Alcohol abuse Brother   . Heart disease Neg Hx   . Diabetes Neg Hx   . Stroke Neg Hx     Social History Social History   Tobacco Use  . Smoking status: Former Smoker    Packs/day: 0.50    Years: 50.00    Pack years: 25.00    Types: Cigarettes    Last attempt to quit: 11/27/1985    Years since quitting: 33.0  . Smokeless tobacco: Never Used  Substance Use Topics  . Alcohol use: No  . Drug  use: No    Review of Systems  Constitutional: Negative for fever. + confusion ENT: Negative for sore throat. Cardiovascular: Negative for chest pain. Respiratory: Negative for shortness of breath. Gastrointestinal: Negative for abdominal pain, vomiting or diarrhea. Musculoskeletal: Negative for back pain.  ____________________________________________   PHYSICAL EXAM:  VITAL SIGNS: ED Triage Vitals  Enc Vitals Group     BP 11/30/18 1505 (!) 147/90     Pulse --      Resp --      Temp 11/30/18 1505 (!) 97.4 F (36.3 C)     Temp Source 11/30/18 1505 Axillary     SpO2 11/30/18 1505 100 %     Weight 11/30/18 1506 191 lb 12.8 oz (87 kg)     Height 11/30/18 1506 5\' 10"  (1.778 m)     Head Circumference --      Peak Flow --      Pain Score --      Pain Loc --      Pain Edu? --      Excl. in GC? --     Constitutional: Sleepy, arousable by name calling, denies any pain, falls back to sleep, no distress HEENT:      Head: Normocephalic and atraumatic.         Eyes: Conjunctivae are normal. Sclera is non-icteric.       Mouth/Throat: Mucous membranes are moist.       Neck: Supple with no signs of meningismus. Cardiovascular: Regular rate and rhythm. No murmurs, gallops, or rubs. 2+ symmetrical distal pulses are present in all extremities. No JVD. Respiratory: Shallow breathing, normal sats, normal work of breathing, faint crackles bilaterally  Gastrointestinal: Soft, non tender, and non distended with positive bowel sounds. No rebound or guarding. Musculoskeletal: Nontender with normal range of motion in all extremities. No edema, cyanosis, or erythema of extremities. Neurologic: Somnolent but easily arousable, face is symmetric, speech is normal, moving all 4 extremities, follos simple commands. Skin: Skin is warm, dry and intact. No rash noted. Psychiatric: Mood and affect are normal. Speech and behavior are normal.  ____________________________________________   LABS (all  labs ordered are listed, but only abnormal results are displayed)  Labs Reviewed  COMPREHENSIVE METABOLIC PANEL - Abnormal; Notable for the following components:      Result Value   Glucose, Bld 153 (*)    All other components within normal limits  CBC WITH DIFFERENTIAL/PLATELET - Abnormal; Notable for the following components:   RDW 19.0 (*)    All other components within normal limits  URINALYSIS, COMPLETE (UACMP) WITH MICROSCOPIC - Abnormal; Notable for the following components:   Color, Urine YELLOW (*)    APPearance CLEAR (*)    Glucose, UA 50 (*)    Ketones, ur 5 (*)  All other components within normal limits  CBG MONITORING, ED   ____________________________________________  EKG  ED ECG REPORT I, Nita Sickle, the attending physician, personally viewed and interpreted this ECG.  Normal sinus rhythm, rate of 81, normal intervals, normal axis, no Dennis elevations or depressions. Unchanged from prior ____________________________________________  RADIOLOGY  I have personally reviewed the images performed during this visit and I agree with the Radiologist's read.   Interpretation by Radiologist:  Dg Chest 2 View  Result Date: 11/30/2018 CLINICAL DATA:  Acute mental status changes. Recurrent urinary tract infection. Current history of hypertension, diabetes and dementia. Former smoker. EXAM: CHEST - 2 VIEW COMPARISON:  11/11/2018 and earlier. FINDINGS: AP ERECT and LATERAL images were obtained. Suboptimal inspiration accounts for crowded bronchovascular markings diffusely and atelectasis in the BILATERAL lower lobes, RIGHT MIDDLE LOBE and lingula, and accentuates the cardiac silhouette. Taking this into account, cardiac silhouette upper normal in size to slightly enlarged. Lungs otherwise clear. Pulmonary vascularity normal. No pleural effusions. Degenerative changes and DISH involving the thoracic spine. IMPRESSION: Suboptimal inspiration accounts for atelectasis in the  BILATERAL lower lobes, RIGHT MIDDLE LOBE and lingula. No acute cardiopulmonary disease otherwise. Electronically Signed   By: Hulan Saas M.D.   On: 11/30/2018 16:03   Ct Head Wo Contrast  Result Date: 11/30/2018 CLINICAL DATA:  Altered mental status. EXAM: CT HEAD WITHOUT CONTRAST TECHNIQUE: Contiguous axial images were obtained from the base of the skull through the vertex without intravenous contrast. COMPARISON:  CT 11/11/2018 FINDINGS: Brain: Moderate atrophy unchanged. Negative for acute infarct, hemorrhage, or mass lesion. Small chronic infarct left cerebellum. Vascular: Atherosclerotic calcification. Negative for hyperdense vessel Skull: Negative Sinuses/Orbits: Negative Other: None IMPRESSION: No acute abnormality no change from the prior CT. Electronically Signed   By: Marlan Palau M.D.   On: 11/30/2018 17:10      ____________________________________________   PROCEDURES  Procedure(s) performed: None Procedures Critical Care performed:  None ____________________________________________   INITIAL IMPRESSION / ASSESSMENT AND PLAN / ED COURSE  78 y.o. male with a history of dementia, diabetes, hypertension, recurrent UTIs who presents for evaluation of altered mental status.  Ddx UTI, PNA, dehydration, AKI, intracranial hemorrhage. Low suspicion for flu with no respiratory symptoms, vomiting, diarrhea or fever. No focal neuro deficits on exam concerning for stroke.  Patient is somnolent but easily arousable, grossly neurologically intact, following simple commands, not answering many questions, shallow breathing with normal work of breathing and normal sats with faint crackles bilaterally possibly from atelectasis versus pneumonia.  Abdomen is soft with no tenderness.  Labs, CXR, imaging and UA pending.     _________________________ 6:41 PM on 11/30/2018 ----------------------------------------- Patient is awake, alert, answers questions, family at the bedside.  Head CT,  chest x-ray, EKG, urinalysis, labs all within normal limits.  Family requested IV hydration since patient has been dehydrated in the past and presented similarly.  He was given a liter of fluids.  Recommended close follow-up with primary care doctor.  At this time with normal vitals, normal exam, normal evaluation patient does not meet criteria for admission.  Discussed standard return precautions with family.    As part of my medical decision making, I reviewed the following data within the electronic MEDICAL RECORD NUMBER History obtained from family, Nursing notes reviewed and incorporated, Labs reviewed , EKG interpreted , Old EKG reviewed, Old chart reviewed, Radiograph reviewed , Notes from prior ED visits and [place] Controlled Substance Database    Pertinent labs & imaging results that were available during  my care of the patient were reviewed by me and considered in my medical decision making (see chart for details).    ____________________________________________   FINAL CLINICAL IMPRESSION(S) / ED DIAGNOSES  Final diagnoses:  Altered mental status, unspecified altered mental status type      NEW MEDICATIONS STARTED DURING THIS VISIT:  ED Discharge Orders    None       Note:  This document was prepared using Dragon voice recognition software and may include unintentional dictation errors.    Don PerkingVeronese, [place], MD 11/30/18 1843

## 2018-12-04 DIAGNOSIS — Z79899 Other long term (current) drug therapy: Secondary | ICD-10-CM | POA: Diagnosis not present

## 2018-12-07 DIAGNOSIS — E119 Type 2 diabetes mellitus without complications: Secondary | ICD-10-CM | POA: Diagnosis not present

## 2018-12-07 DIAGNOSIS — N3000 Acute cystitis without hematuria: Secondary | ICD-10-CM | POA: Diagnosis not present

## 2018-12-12 DIAGNOSIS — B351 Tinea unguium: Secondary | ICD-10-CM | POA: Diagnosis not present

## 2018-12-25 DIAGNOSIS — N3000 Acute cystitis without hematuria: Secondary | ICD-10-CM | POA: Diagnosis not present

## 2018-12-25 DIAGNOSIS — E119 Type 2 diabetes mellitus without complications: Secondary | ICD-10-CM | POA: Diagnosis not present

## 2019-01-07 ENCOUNTER — Emergency Department
Admission: EM | Admit: 2019-01-07 | Discharge: 2019-01-07 | Disposition: A | Discharge status: Home / Self Care | Payer: Medicare Other | Attending: Emergency Medicine | Admitting: Emergency Medicine

## 2019-01-07 ENCOUNTER — Other Ambulatory Visit: Payer: Self-pay

## 2019-01-07 DIAGNOSIS — F0390 Unspecified dementia without behavioral disturbance: Secondary | ICD-10-CM | POA: Diagnosis not present

## 2019-01-07 DIAGNOSIS — I10 Essential (primary) hypertension: Secondary | ICD-10-CM | POA: Diagnosis not present

## 2019-01-07 DIAGNOSIS — R531 Weakness: Secondary | ICD-10-CM

## 2019-01-07 DIAGNOSIS — Z87891 Personal history of nicotine dependence: Secondary | ICD-10-CM | POA: Diagnosis not present

## 2019-01-07 DIAGNOSIS — E119 Type 2 diabetes mellitus without complications: Secondary | ICD-10-CM | POA: Insufficient documentation

## 2019-01-07 DIAGNOSIS — Z79899 Other long term (current) drug therapy: Secondary | ICD-10-CM | POA: Insufficient documentation

## 2019-01-07 LAB — CBC
HCT: 41.6 % (ref 39.0–52.0)
HEMOGLOBIN: 13.1 g/dL (ref 13.0–17.0)
MCH: 26.3 pg (ref 26.0–34.0)
MCHC: 31.5 g/dL (ref 30.0–36.0)
MCV: 83.4 fL (ref 80.0–100.0)
Platelets: 278 10*3/uL (ref 150–400)
RBC: 4.99 MIL/uL (ref 4.22–5.81)
RDW: 18.6 % — ABNORMAL HIGH (ref 11.5–15.5)
WBC: 8.2 10*3/uL (ref 4.0–10.5)
nRBC: 0 % (ref 0.0–0.2)

## 2019-01-07 LAB — BASIC METABOLIC PANEL
Anion gap: 7 (ref 5–15)
BUN: 12 mg/dL (ref 8–23)
CO2: 29 mmol/L (ref 22–32)
Calcium: 9.2 mg/dL (ref 8.9–10.3)
Chloride: 101 mmol/L (ref 98–111)
Creatinine, Ser: 1 mg/dL (ref 0.61–1.24)
GFR calc Af Amer: >60 mL/min (ref >60)
GFR calc non Af Amer: >60 mL/min (ref >60)
GLUCOSE: 184 mg/dL — AB (ref 70–99)
Potassium: 4 mmol/L (ref 3.5–5.1)
Sodium: 137 mmol/L (ref 135–145)

## 2019-01-07 LAB — URINALYSIS, COMPLETE (UACMP) WITH MICROSCOPIC
Bacteria, UA: NONE SEEN
Bilirubin Urine: NEGATIVE
GLUCOSE, UA: 50 mg/dL — AB
Ketones, ur: 5 mg/dL — AB
Leukocytes,Ua: NEGATIVE
Nitrite: NEGATIVE
Protein, ur: NEGATIVE mg/dL
Specific Gravity, Urine: 1.014 (ref 1.005–1.030)
pH: 6 (ref 5.0–8.0)

## 2019-01-07 LAB — TROPONIN I: Troponin I: <0.03 ng/mL (ref <0.03)

## 2019-01-07 MED ORDER — SODIUM CHLORIDE 0.9 % IV BOLUS
1000.0000 mL | Freq: Once | INTRAVENOUS | Status: AC
  Administered 2019-01-07: 1000 mL via INTRAVENOUS

## 2019-01-07 NOTE — ED Notes (Signed)
Family up to desk wanting this RN to come re-eval the pt due to pt looking much better than he did when they initially brought him in. Family thinks pt was dehydrated because he isn't encouraged to drink at the nsg home, pt drank both bottles of water the family had with them. Pt appears more alert and is acknowledging this  RN and smiling

## 2019-01-07 NOTE — Discharge Instructions (Addendum)
Nursing home staff: Please encourage oral hydration throughout the day preferably every 2 hours while awake as the patient appears to be very sensitive to hypovolemia/dehydration.    Return to the emergency department for any significant weakness, confusion more so than baseline, fever or any other symptom personally concerning to staff.

## 2019-01-07 NOTE — ED Triage Notes (Signed)
Pt brought in by son, son states that he saw him on Friday and he didn't look like this, pt appears weak and had to be assisted out of the car. Son states he is normally able to move himself and isn't today, pt is coming from [facility]. Son states that he was going to visit and saw him this way and was very concerned

## 2019-01-07 NOTE — ED Provider Notes (Signed)
[facility] Emergency Department Provider Note  Time seen: 8:03 PM  I have reviewed the triage vital signs and the nursing notes.   HISTORY  Chief Complaint Weakness   HPI Dennis Sandoval is a 78 y.o. male with a past medical history of dementia, diabetes, hypertension, presents to the emergency department for weakness.  According to the son, the patient's other son had seen him earlier today at the nursing facility and the patient appeared less responsive than normal.  Much more weak than normal.  Son states this is happened before when the patient becomes dehydrated.  He had the patient drink several glasses of water at the nursing facility, and upon arrival to the emergency department son states the patient is largely acting back to his baseline.   Past Medical History:  Diagnosis Date  . Alzheimer disease (HCC) 06/16/2015  . Dementia (HCC)   . Diabetes mellitus without complication (HCC)   . Hypertension   . UTI (urinary tract infection)     Patient Active Problem List   Diagnosis Date Noted  . Altered mental status 06/07/2018  . UTI (urinary tract infection) 06/05/2018  . Sepsis (HCC) 03/27/2018  . 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 History of BPH 12/21/2016  . Medication monitoring encounter 01/28/2016  . Hyperglycemia 01/28/2016  . Preventative health care 12/21/2015  . Need for pneumococcal vaccination 12/21/2015  . Screening for AAA (abdominal aortic aneurysm) 12/01/2015  . Smoking history 11/28/2015  . Dementia (HCC) 07/25/2015  . Benign hypertension 07/25/2015    Past Surgical History:  Procedure Laterality Date  . PROSTATE SURGERY      Prior to Admission medications   Medication Sig Start Date End Date Taking? Authorizing Provider  atorvastatin (LIPITOR) 10 MG tablet Take 10 mg by mouth daily.    [provider]  Cholecalciferol (VITAMIN D) 50 MCG (2000 UT) tablet Take  2,000 Units by mouth daily.    [provider]  Cranberry 500 MG CAPS Take 500 mg by mouth daily.    [provider]  divalproex (DEPAKOTE SPRINKLE) 125 MG capsule Take 125 mg by mouth every 12 (twelve) hours.     [provider]  donepezil (ARICEPT) 10 MG tablet Take 1 tablet (10 mg total) by mouth at bedtime. Patient taking differently: Take 10 mg by mouth daily.  01/14/16   Lada, Janit Bern, MD  escitalopram (LEXAPRO) 10 MG tablet Take 0.5 tablets (5 mg total) by mouth daily. (for mood) Patient taking differently: Take 10 mg by mouth daily. (for mood) 01/14/16   Lada, Janit Bern, MD  Lactobacillus (ACIDOPHILUS/PECTIN) 100 MG CAPS Take 1 capsule by mouth at bedtime.    [provider]  memantine (NAMENDA) 5 MG tablet Take 1 tablet (5 mg total) by mouth 2 (two) times daily. 04/14/16   Kerman Passey, MD  metFORMIN (GLUCOPHAGE) 500 MG tablet Take 1 tablet (500 mg total) by mouth 2 (two) times daily with a meal. 06/08/18 11/30/18  Sudini, Wardell Heath, MD  OLANZapine zydis (ZYPREXA) 5 MG disintegrating tablet Take 5 mg by mouth at bedtime.    [provider]  traZODone (DESYREL) 50 MG tablet Take 1 tablet (50 mg total) by mouth at bedtime as needed for sleep. 03/29/18   [name], MD    No Known Allergies  Family History  Problem Relation Age of Onset  . Dementia Mother   . Dementia Father   . Cancer Brother  not sure what type  . Alcohol abuse Brother   . Heart disease Neg Hx   . Diabetes Neg Hx   . Stroke Neg Hx     Social History Social History   Tobacco Use  . Smoking status: Former Smoker    Packs/day: 0.50    Years: 50.00    Pack years: 25.00    Types: Cigarettes    Last attempt to quit: 11/27/1985    Years since quitting: 33.1  . Smokeless tobacco: Never Used  Substance Use Topics  . Alcohol use: No  . Drug use: No    Review of Systems Unable to obtain adequate/accurate review of systems secondary to baseline  dementia.  ____________________________________________   PHYSICAL EXAM:  VITAL SIGNS: ED Triage Vitals  Enc Vitals Group     BP 01/07/19 1609 (!) 144/97     Pulse Rate 01/07/19 1609 75     Resp 01/07/19 1609 18     Temp 01/07/19 1609 (!) 97.2 F (36.2 C)     Temp Source 01/07/19 1609 Oral     SpO2 01/07/19 1609 100 %     Weight 01/07/19 1620 200 lb (90.7 kg)     Height 01/07/19 1620 6' (1.829 m)     Head Circumference --      Peak Flow --      Pain Score 01/07/19 1620 0     Pain Loc --      Pain Edu? --      Excl. in GC? --    Constitutional: Patient is awake and alert, no acute distress.  No medical complaints. Eyes: Normal exam ENT   Head: Normocephalic and atraumatic.   Mouth/Throat: Mucous membranes are moist. Cardiovascular: Normal rate, regular rhythm.  Respiratory: Normal respiratory effort without tachypnea nor retractions. Breath sounds are clear Gastrointestinal: Soft and nontender. No distention. Musculoskeletal: Nontender with normal range of motion in all extremities.  Neurologic:  Normal speech and language. No gross focal neurologic deficits  Skin:  Skin is warm, dry and intact.  Psychiatric: Mood and affect are normal.   ____________________________________________    EKG  EKG viewed and interpreted by myself shows a normal sinus rhythm at 79 bpm with a narrow QRS, left axis deviation, largely normal intervals, nonspecific but no concerning Dennis changes.  ____________________________________________   INITIAL IMPRESSION / ASSESSMENT AND PLAN / ED COURSE  Pertinent labs & imaging results that were available during my care of the patient were reviewed by me and considered in my medical decision making (see chart for details).  Patient presents to the emergency department for weakness and decreased responsiveness although family states this has largely resolved.  States similar events have occurred with dehydration in the past.  Patient drinks  several glasses of water before coming to the ER.  Upon arrival to the ER son states he is acting back at baseline.  We will check labs, IV hydrate, check urinalysis and continue to closely monitor.  Patient's labs are largely within normal limits, urinalysis is normal.  Patient continues to appear well, son states he is acting at baseline.  We will plan to discharge patient home with PCP follow-up.  Family agreeable to plan of care.  ____________________________________________   FINAL CLINICAL IMPRESSION(S) / ED DIAGNOSES  Weakness   Minna Antis, MD 01/07/19 2152

## 2019-01-07 NOTE — ED Triage Notes (Addendum)
Pt comes via POV from [facility] with c/o weakness. Family states pt has not urinated since about 11am and could be dehydrated.  Pt is not able to follow command and ambulates. Pt states he is sleepy and tired.  Pt denies any pain.  Per daughter in law when she arrived to facility she noticed how weak pt was and gave him about 3-4 glasses of water. She states he sucked it all down. She states it seemed to perk up pt some.  Pt sleeping in triage at this time. Pt has hx of dementia but per family this is not his normal baseline.

## 2019-02-05 ENCOUNTER — Other Ambulatory Visit: Payer: Self-pay

## 2019-02-05 NOTE — Patient Outreach (Signed)
[facility] Customer service manager [facility]) Care Management  02/05/2019  Dennis Sandoval [place] 1941/05/27 [id number]   Medication Adherence call to Mr. [address] [place] patient did not answer patient is due on Metformin 500 mg patient is at a long term nursing facility Pharmacy said they will deliver tomorrow. Dennis Sandoval is showing past due under [employer].   Lillia Abed CPhT Pharmacy Technician [facility] Management Direct Dial [phone]  Fax [phone] [email or web address]

## 2019-02-10 ENCOUNTER — Emergency Department: Payer: Medicare Other

## 2019-02-10 ENCOUNTER — Other Ambulatory Visit: Payer: Self-pay

## 2019-02-10 ENCOUNTER — Emergency Department
Admission: EM | Admit: 2019-02-10 | Discharge: 2019-02-10 | Disposition: A | Discharge status: Home / Self Care | Payer: Medicare Other | Attending: Emergency Medicine | Admitting: Emergency Medicine

## 2019-02-10 DIAGNOSIS — Z87891 Personal history of nicotine dependence: Secondary | ICD-10-CM | POA: Insufficient documentation

## 2019-02-10 DIAGNOSIS — Z043 Encounter for examination and observation following other accident: Secondary | ICD-10-CM | POA: Insufficient documentation

## 2019-02-10 DIAGNOSIS — Z7984 Long term (current) use of oral hypoglycemic drugs: Secondary | ICD-10-CM | POA: Diagnosis not present

## 2019-02-10 DIAGNOSIS — G309 Alzheimer's disease, unspecified: Secondary | ICD-10-CM | POA: Insufficient documentation

## 2019-02-10 DIAGNOSIS — Z79899 Other long term (current) drug therapy: Secondary | ICD-10-CM | POA: Insufficient documentation

## 2019-02-10 DIAGNOSIS — E119 Type 2 diabetes mellitus without complications: Secondary | ICD-10-CM | POA: Diagnosis not present

## 2019-02-10 DIAGNOSIS — I10 Essential (primary) hypertension: Secondary | ICD-10-CM | POA: Diagnosis not present

## 2019-02-10 DIAGNOSIS — W19XXXA Unspecified fall, initial encounter: Secondary | ICD-10-CM | POA: Insufficient documentation

## 2019-02-10 DIAGNOSIS — Y939 Activity, unspecified: Secondary | ICD-10-CM | POA: Insufficient documentation

## 2019-02-10 DIAGNOSIS — Y9289 Other specified places as the place of occurrence of the external cause: Secondary | ICD-10-CM | POA: Insufficient documentation

## 2019-02-10 DIAGNOSIS — Y998 Other external cause status: Secondary | ICD-10-CM | POA: Diagnosis not present

## 2019-02-10 LAB — CBC WITH DIFFERENTIAL/PLATELET
Abs Immature Granulocytes: 0.05 10*3/uL (ref 0.00–0.07)
BASOS PCT: 1 %
Basophils Absolute: 0.1 10*3/uL (ref 0.0–0.1)
Eosinophils Absolute: 0.4 10*3/uL (ref 0.0–0.5)
Eosinophils Relative: 4 %
HCT: 39.8 % (ref 39.0–52.0)
Hemoglobin: 12.7 g/dL — ABNORMAL LOW (ref 13.0–17.0)
IMMATURE GRANULOCYTES: 1 %
Lymphocytes Relative: 21 %
Lymphs Abs: 1.7 10*3/uL (ref 0.7–4.0)
MCH: 26.2 pg (ref 26.0–34.0)
MCHC: 31.9 g/dL (ref 30.0–36.0)
MCV: 82.2 fL (ref 80.0–100.0)
Monocytes Absolute: 0.7 10*3/uL (ref 0.1–1.0)
Monocytes Relative: 9 %
NEUTROS PCT: 64 %
NRBC: 0 % (ref 0.0–0.2)
Neutro Abs: 5.3 10*3/uL (ref 1.7–7.7)
Platelets: 252 10*3/uL (ref 150–400)
RBC: 4.84 MIL/uL (ref 4.22–5.81)
RDW: 18.4 % — ABNORMAL HIGH (ref 11.5–15.5)
WBC: 8.1 10*3/uL (ref 4.0–10.5)

## 2019-02-10 LAB — BASIC METABOLIC PANEL
Anion gap: 8 (ref 5–15)
BUN: 11 mg/dL (ref 8–23)
CO2: 28 mmol/L (ref 22–32)
Calcium: 9 mg/dL (ref 8.9–10.3)
Chloride: 104 mmol/L (ref 98–111)
Creatinine, Ser: 0.96 mg/dL (ref 0.61–1.24)
GFR calc non Af Amer: >60 mL/min (ref >60)
Glucose, Bld: 87 mg/dL (ref 70–99)
Potassium: 3.7 mmol/L (ref 3.5–5.1)
SODIUM: 140 mmol/L (ref 135–145)

## 2019-02-10 LAB — URINALYSIS, COMPLETE (UACMP) WITH MICROSCOPIC
Bacteria, UA: NONE SEEN
Bilirubin Urine: NEGATIVE
Glucose, UA: NEGATIVE mg/dL
Hgb urine dipstick: NEGATIVE
KETONES UR: NEGATIVE mg/dL
Leukocytes,Ua: NEGATIVE
Nitrite: NEGATIVE
PH: 5 (ref 5.0–8.0)
PROTEIN: NEGATIVE mg/dL
Specific Gravity, Urine: 1.018 (ref 1.005–1.030)
Squamous Epithelial / HPF: NONE SEEN (ref 0–5)

## 2019-02-10 NOTE — ED Notes (Signed)
In and out cath completed by this RN, assisted by [facility]. 22F. Patient tolerated well. Sterile technique maintained. Clear, amber urine returned.

## 2019-02-10 NOTE — ED Notes (Signed)
Attempted to call patients son, Rachael Fee, for discharge pick up/transport.

## 2019-02-10 NOTE — ED Provider Notes (Signed)
[facility] Emergency Department Provider Note ____________________________________________   First MD Initiated Contact with Patient 02/10/19 [phone]     (approximate)  I have reviewed the triage vital signs and the nursing notes.   HISTORY  Chief Complaint Fall  Level 5 caveat: History present illness limited due to dementia  HPI Dennis Sandoval is a 78 y.o. male with PMH as noted below who presents for evaluation after an apparent fall.  The patient was sitting in his chair and then found sitting on the floor after an unwitnessed fall.  The patient does not remember what happened and is not able to give any history.  He denies any pain currently.  Past Medical History:  Diagnosis Date  . Alzheimer disease (HCC) 06/16/2015  . Dementia (HCC)   . Diabetes mellitus without complication (HCC)   . Hypertension   . UTI (urinary tract infection)     Patient Active Problem List   Diagnosis Date Noted  . Altered mental status 06/07/2018  . UTI (urinary tract infection) 06/05/2018  . Sepsis (HCC) 03/27/2018  . 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 History of BPH 12/21/2016  . Medication monitoring encounter 01/28/2016  . Hyperglycemia 01/28/2016  . Preventative health care 12/21/2015  . Need for pneumococcal vaccination 12/21/2015  . Screening for AAA (abdominal aortic aneurysm) 12/01/2015  . Smoking history 11/28/2015  . Dementia (HCC) 07/25/2015  . Benign hypertension 07/25/2015    Past Surgical History:  Procedure Laterality Date  . PROSTATE SURGERY      Prior to Admission medications   Medication Sig Start Date End Date Taking? Authorizing Provider  atorvastatin (LIPITOR) 10 MG tablet Take 10 mg by mouth daily.    [provider]  Cholecalciferol (VITAMIN D) 50 MCG (2000 UT) tablet Take 2,000 Units by mouth daily.    [provider]  Cranberry 500 MG CAPS Take 500 mg by mouth  daily.    [provider]  divalproex (DEPAKOTE SPRINKLE) 125 MG capsule Take 125 mg by mouth every 12 (twelve) hours.     [provider]  donepezil (ARICEPT) 10 MG tablet Take 1 tablet (10 mg total) by mouth at bedtime. Patient taking differently: Take 10 mg by mouth daily.  01/14/16   Lada, Janit Bern, MD  escitalopram (LEXAPRO) 10 MG tablet Take 0.5 tablets (5 mg total) by mouth daily. (for mood) Patient taking differently: Take 10 mg by mouth daily. (for mood) 01/14/16   Lada, Janit Bern, MD  Lactobacillus (ACIDOPHILUS/PECTIN) 100 MG CAPS Take 1 capsule by mouth at bedtime.    [provider]  memantine (NAMENDA) 5 MG tablet Take 1 tablet (5 mg total) by mouth 2 (two) times daily. 04/14/16   Kerman Passey, MD  metFORMIN (GLUCOPHAGE) 500 MG tablet Take 1 tablet (500 mg total) by mouth 2 (two) times daily with a meal. 06/08/18 11/30/18  Sudini, Wardell Heath, MD  OLANZapine zydis (ZYPREXA) 5 MG disintegrating tablet Take 5 mg by mouth at bedtime.    [provider]  traZODone (DESYREL) 50 MG tablet Take 1 tablet (50 mg total) by mouth at bedtime as needed for sleep. 03/29/18   [name], MD    Allergies Patient has no known allergies.  Family History  Problem Relation Age of Onset  . Dementia Mother   . Dementia Father   . Cancer Brother        not sure what type  . Alcohol abuse Brother   .  Heart disease Neg Hx   . Diabetes Neg Hx   . Stroke Neg Hx     Social History Social History   Tobacco Use  . Smoking status: Former Smoker    Packs/day: 0.50    Years: 50.00    Pack years: 25.00    Types: Cigarettes    Last attempt to quit: 11/27/1985    Years since quitting: 33.2  . Smokeless tobacco: Never Used  Substance Use Topics  . Alcohol use: No  . Drug use: No    Review of Systems Level 5 caveat: Unable to obtain review of systems due to dementia    ____________________________________________   PHYSICAL EXAM:  VITAL SIGNS: ED Triage  Vitals  Enc Vitals Group     BP 02/10/19 0653 136/88     Pulse --      Resp --      Temp --      Temp src --      SpO2 02/10/19 0650 97 %     Weight 02/10/19 0655 195 lb 8 oz (88.7 kg)     Height 02/10/19 0655 5\' 11"  (1.803 m)     Head Circumference --      Peak Flow --      Pain Score 02/10/19 0654 0     Pain Loc --      Pain Edu? --      Excl. in GC? --     Constitutional: Alert, oriented x1.  Relatively comfortable appearing and in no acute distress. Eyes: Conjunctivae are normal.  EOMI.  PERRLA. Head: Atraumatic. Nose: No congestion/rhinnorhea. Mouth/Throat: Mucous membranes are moist.   Neck: Normal range of motion.  No midline spinal tenderness. Cardiovascular: Normal rate, regular rhythm. Grossly normal heart sounds.  Good peripheral circulation. Respiratory: Normal respiratory effort.  No retractions. Lungs CTAB. Gastrointestinal: Soft and nontender. No distention.  Genitourinary: No flank tenderness. Musculoskeletal: No lower extremity edema.  Extremities warm and well perfused.  No midline spinal tenderness. Neurologic: Motor intact in all extremities.  Normal speech. Skin:  Skin is warm and dry. No rash noted. Psychiatric: Calm and cooperative.  ____________________________________________   LABS (all labs ordered are listed, but only abnormal results are displayed)  Labs Reviewed  CBC WITH DIFFERENTIAL/PLATELET - Abnormal; Notable for the following components:      Result Value   Hemoglobin 12.7 (*)    RDW 18.4 (*)    All other components within normal limits  URINALYSIS, COMPLETE (UACMP) WITH MICROSCOPIC - Abnormal; Notable for the following components:   Color, Urine YELLOW (*)    APPearance CLEAR (*)    All other components within normal limits  BASIC METABOLIC PANEL   ____________________________________________  EKG  ED ECG REPORT I, Dionne Bucy, the attending physician, personally viewed and interpreted this ECG.  Date: 02/10/2019 EKG  Time: 0650 Rate: 71 Rhythm: normal sinus rhythm QRS Axis: normal Intervals: normal Dennis/T Wave abnormalities: normal Narrative Interpretation: no evidence of acute ischemia  ____________________________________________  RADIOLOGY  CT head: No ICH CT cervical spine: No acute fracture  ____________________________________________   PROCEDURES  Procedure(s) performed: No  Procedures  Critical Care performed: No ____________________________________________   INITIAL IMPRESSION / ASSESSMENT AND PLAN / ED COURSE  Pertinent labs & imaging results that were available during my care of the patient were reviewed by me and considered in my medical decision making (see chart for details).  78 year old male with history of dementia and other PMH as noted above presents after being found  on the floor in a sitting position after apparent fall out of chair.  He is unable to give any relevant history.  On exam, the patient is relatively comfortable appearing.  His vital signs are normal.  There is no visible evidence of trauma.  He has no midline spinal tenderness.  Neuro exam is nonfocal.  Because he is unable to give any significant history, I will obtain CT head and cervical spine to rule out acute injury as well as basic labs and a UA to evaluate for precipitating causes of weakness.  If the work-up is negative I anticipate discharge back to the facility.  ----------------------------------------- 9:39 AM on 02/10/2019 -----------------------------------------  No acute findings on the work-up.  The patient is stable for discharge back to his facility.  Return precautions provided. ____________________________________________   FINAL CLINICAL IMPRESSION(S) / ED DIAGNOSES  Final diagnoses:  Fall, initial encounter      NEW MEDICATIONS STARTED DURING THIS VISIT:  New Prescriptions   No medications on file     Note:  This document was prepared using Dragon voice  recognition software and may include unintentional dictation errors.    Dionne Bucy, MD 02/10/19 [phone]

## 2019-02-10 NOTE — ED Notes (Signed)
Attempted to call patients son, Casimiro Needle, for discharge pick up/transport.

## 2019-02-10 NOTE — ED Notes (Signed)
Called and spoke to [facility]. Britney at facility reports patient will need to be transported back to facility via EMS. Christen Bame, ED secretary, notified.

## 2019-02-10 NOTE — ED Notes (Signed)
Attempted to call [facility] for report x 2.

## 2019-02-10 NOTE — ED Notes (Signed)
Patient transported to CT at this time. 

## 2019-02-10 NOTE — ED Triage Notes (Signed)
Patient is a resident at [facility]. Patient was sitting in chair then found sitting in floor from unwitnessed fall. Patient brought in by [facility]. Per staff at facility they believe patient fell face first in floor. Patient has history if Diabetes.

## 2019-02-10 NOTE — Discharge Instructions (Addendum)
Dennis Sandoval evaluation in the emergency department today shows no signs of acute injury.  He had a negative CT scan of his head and neck and his labs and urinalysis are normal.  He is safe to go back to his facility.  He should return to the ER for any new or worsening weakness or other concerning new symptoms.

## 2019-02-10 NOTE — ED Notes (Signed)
Pt's son Westan Dresen, [phone] or Xzorion Laur [phone].

## 2019-03-26 DIAGNOSIS — R269 Unspecified abnormalities of gait and mobility: Secondary | ICD-10-CM | POA: Diagnosis not present

## 2019-03-26 DIAGNOSIS — E119 Type 2 diabetes mellitus without complications: Secondary | ICD-10-CM | POA: Diagnosis not present

## 2019-03-26 DIAGNOSIS — I10 Essential (primary) hypertension: Secondary | ICD-10-CM | POA: Diagnosis not present

## 2019-04-09 DIAGNOSIS — R269 Unspecified abnormalities of gait and mobility: Secondary | ICD-10-CM | POA: Diagnosis not present

## 2019-04-09 DIAGNOSIS — E119 Type 2 diabetes mellitus without complications: Secondary | ICD-10-CM | POA: Diagnosis not present

## 2019-04-09 DIAGNOSIS — I10 Essential (primary) hypertension: Secondary | ICD-10-CM | POA: Diagnosis not present

## 2019-04-10 DIAGNOSIS — N390 Urinary tract infection, site not specified: Secondary | ICD-10-CM | POA: Diagnosis not present

## 2019-04-10 DIAGNOSIS — Z79899 Other long term (current) drug therapy: Secondary | ICD-10-CM | POA: Diagnosis not present

## 2019-04-30 DIAGNOSIS — Z79899 Other long term (current) drug therapy: Secondary | ICD-10-CM | POA: Diagnosis not present

## 2019-04-30 DIAGNOSIS — R269 Unspecified abnormalities of gait and mobility: Secondary | ICD-10-CM | POA: Diagnosis not present

## 2019-04-30 DIAGNOSIS — E119 Type 2 diabetes mellitus without complications: Secondary | ICD-10-CM | POA: Diagnosis not present

## 2019-04-30 DIAGNOSIS — R531 Weakness: Secondary | ICD-10-CM | POA: Diagnosis not present

## 2019-04-30 DIAGNOSIS — N390 Urinary tract infection, site not specified: Secondary | ICD-10-CM | POA: Diagnosis not present

## 2019-04-30 DIAGNOSIS — I10 Essential (primary) hypertension: Secondary | ICD-10-CM | POA: Diagnosis not present

## 2019-05-03 DIAGNOSIS — B351 Tinea unguium: Secondary | ICD-10-CM | POA: Diagnosis not present

## 2019-05-03 DIAGNOSIS — E119 Type 2 diabetes mellitus without complications: Secondary | ICD-10-CM | POA: Diagnosis not present

## 2019-05-06 ENCOUNTER — Other Ambulatory Visit: Payer: Self-pay

## 2019-05-06 ENCOUNTER — Emergency Department: Payer: Medicare Other

## 2019-05-06 ENCOUNTER — Emergency Department
Admission: EM | Admit: 2019-05-06 | Discharge: 2019-05-06 | Disposition: A | Discharge status: Home / Self Care | Payer: Medicare Other | Attending: Emergency Medicine | Admitting: Emergency Medicine

## 2019-05-06 ENCOUNTER — Encounter: Payer: Self-pay | Admitting: Emergency Medicine

## 2019-05-06 DIAGNOSIS — Z79899 Other long term (current) drug therapy: Secondary | ICD-10-CM | POA: Insufficient documentation

## 2019-05-06 DIAGNOSIS — R29898 Other symptoms and signs involving the musculoskeletal system: Secondary | ICD-10-CM | POA: Diagnosis not present

## 2019-05-06 DIAGNOSIS — Z7984 Long term (current) use of oral hypoglycemic drugs: Secondary | ICD-10-CM | POA: Insufficient documentation

## 2019-05-06 DIAGNOSIS — E119 Type 2 diabetes mellitus without complications: Secondary | ICD-10-CM | POA: Insufficient documentation

## 2019-05-06 DIAGNOSIS — R531 Weakness: Secondary | ICD-10-CM | POA: Diagnosis not present

## 2019-05-06 DIAGNOSIS — G9389 Other specified disorders of brain: Secondary | ICD-10-CM | POA: Diagnosis not present

## 2019-05-06 DIAGNOSIS — I10 Essential (primary) hypertension: Secondary | ICD-10-CM | POA: Diagnosis not present

## 2019-05-06 DIAGNOSIS — R5383 Other fatigue: Secondary | ICD-10-CM | POA: Diagnosis not present

## 2019-05-06 DIAGNOSIS — G309 Alzheimer's disease, unspecified: Secondary | ICD-10-CM | POA: Insufficient documentation

## 2019-05-06 DIAGNOSIS — Z87891 Personal history of nicotine dependence: Secondary | ICD-10-CM | POA: Insufficient documentation

## 2019-05-06 DIAGNOSIS — Z7401 Bed confinement status: Secondary | ICD-10-CM | POA: Diagnosis not present

## 2019-05-06 DIAGNOSIS — F0280 Dementia in other diseases classified elsewhere without behavioral disturbance: Secondary | ICD-10-CM | POA: Diagnosis not present

## 2019-05-06 DIAGNOSIS — J9811 Atelectasis: Secondary | ICD-10-CM | POA: Diagnosis not present

## 2019-05-06 DIAGNOSIS — M2550 Pain in unspecified joint: Secondary | ICD-10-CM | POA: Diagnosis not present

## 2019-05-06 DIAGNOSIS — R5381 Other malaise: Secondary | ICD-10-CM | POA: Diagnosis not present

## 2019-05-06 DIAGNOSIS — R410 Disorientation, unspecified: Secondary | ICD-10-CM | POA: Diagnosis not present

## 2019-05-06 DIAGNOSIS — R404 Transient alteration of awareness: Secondary | ICD-10-CM | POA: Diagnosis not present

## 2019-05-06 LAB — URINALYSIS, COMPLETE (UACMP) WITH MICROSCOPIC
Bacteria, UA: NONE SEEN
Bilirubin Urine: NEGATIVE
Glucose, UA: NEGATIVE mg/dL
Hgb urine dipstick: NEGATIVE
Ketones, ur: NEGATIVE mg/dL
Leukocytes,Ua: NEGATIVE
Nitrite: NEGATIVE
Protein, ur: NEGATIVE mg/dL
Specific Gravity, Urine: 1.001 — ABNORMAL LOW (ref 1.005–1.030)
Squamous Epithelial / HPF: NONE SEEN (ref 0–5)
pH: 6 (ref 5.0–8.0)

## 2019-05-06 LAB — BASIC METABOLIC PANEL
Anion gap: 10 (ref 5–15)
BUN: 11 mg/dL (ref 8–23)
CO2: 28 mmol/L (ref 22–32)
Calcium: 8.9 mg/dL (ref 8.9–10.3)
Chloride: 101 mmol/L (ref 98–111)
Creatinine, Ser: 0.97 mg/dL (ref 0.61–1.24)
GFR calc Af Amer: >60 mL/min (ref >60)
GFR calc non Af Amer: >60 mL/min (ref >60)
Glucose, Bld: 111 mg/dL — ABNORMAL HIGH (ref 70–99)
Potassium: 4 mmol/L (ref 3.5–5.1)
Sodium: 139 mmol/L (ref 135–145)

## 2019-05-06 LAB — CBC WITH DIFFERENTIAL/PLATELET
Abs Immature Granulocytes: 0.05 10*3/uL (ref 0.00–0.07)
Basophils Absolute: 0 10*3/uL (ref 0.0–0.1)
Basophils Relative: 0 %
Eosinophils Absolute: 0.3 10*3/uL (ref 0.0–0.5)
Eosinophils Relative: 3 %
HCT: 39.7 % (ref 39.0–52.0)
Hemoglobin: 12.7 g/dL — ABNORMAL LOW (ref 13.0–17.0)
Immature Granulocytes: 1 %
Lymphocytes Relative: 19 %
Lymphs Abs: 1.6 10*3/uL (ref 0.7–4.0)
MCH: 26.4 pg (ref 26.0–34.0)
MCHC: 32 g/dL (ref 30.0–36.0)
MCV: 82.5 fL (ref 80.0–100.0)
Monocytes Absolute: 1 10*3/uL (ref 0.1–1.0)
Monocytes Relative: 11 %
Neutro Abs: 5.7 10*3/uL (ref 1.7–7.7)
Neutrophils Relative %: 66 %
Platelets: 243 10*3/uL (ref 150–400)
RBC: 4.81 MIL/uL (ref 4.22–5.81)
RDW: 17.8 % — ABNORMAL HIGH (ref 11.5–15.5)
WBC: 8.6 10*3/uL (ref 4.0–10.5)
nRBC: 0.2 % (ref 0.0–0.2)

## 2019-05-06 NOTE — ED Notes (Signed)
Patient is incontinent of urine.  Pad changed after in and out cath performed.  Pad urine soaked.  Had been changed about 1 hour previously.

## 2019-05-06 NOTE — ED Triage Notes (Signed)
Pt to ED via [facility] from [facility] for decline in health. Per EMS pt has hx/o dementia. Pt has had decline in health over the last few weeks, pt is more lethargic and not as active, pts family wanted him sent out for evaluation. Pt was started on antibiotics yesterday for UTI.

## 2019-05-06 NOTE — ED Notes (Signed)
Spoke to patient's son at this time.  Patient was diagnosed for a UTI on the 17th but didn't get the first dose until this morning.  Patient's son states patient's doctor had not prescribed an antibiotic until yesterday.  Son states patient was ambulatory with walker/assistance 2.5 weeks ago but now is not walking well at all.

## 2019-05-06 NOTE — ED Notes (Signed)
This tech cleaned up pt. Pt placed in a clean gown and personal clothing were placed in a personal belongings bag.

## 2019-05-06 NOTE — ED Provider Notes (Addendum)
[facility] Emergency Department Provider Note  ____________________________________________   I have reviewed the triage vital signs and the nursing notes. Where available I have reviewed prior notes and, if possible and indicated, outside hospital notes.    HISTORY  Chief Complaint Failure To Thrive    HPI Dennis Sandoval is a 78 y.o. male  Patient seen and evaluated during the coronavirus epidemic during a time with low staffing, patient sent him apparently at the essence is a family because they are worried that he has been getting gradually weaker.  He himself cannot give a history.  Over the last 3 weeks he seems to be weaker to them.  Patient himself has no complaints but suffers from significant dementia.  Will follow some commands no history of Alzheimer's disease, dementia diabetes hypertension. Patient apparently was diagnosed with UTI did get his first dose of antibiotics today.   Past Medical History:  Diagnosis Date  . Alzheimer disease (HCC) 06/16/2015  . Dementia (HCC)   . Diabetes mellitus without complication (HCC)   . Hypertension   . UTI (urinary tract infection)     Patient Active Problem List   Diagnosis Date Noted  . Altered mental status 06/07/2018  . UTI (urinary tract infection) 06/05/2018  . Sepsis (HCC) 03/27/2018  . 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 History of BPH 12/21/2016  . Medication monitoring encounter 01/28/2016  . Hyperglycemia 01/28/2016  . Preventative health care 12/21/2015  . Need for pneumococcal vaccination 12/21/2015  . Screening for AAA (abdominal aortic aneurysm) 12/01/2015  . Smoking history 11/28/2015  . Dementia (HCC) 07/25/2015  . Benign hypertension 07/25/2015    Past Surgical History:  Procedure Laterality Date  . PROSTATE SURGERY      Prior to Admission medications   Medication Sig Start Date End Date Taking? Authorizing Provider   atorvastatin (LIPITOR) 10 MG tablet Take 10 mg by mouth daily.   Yes [provider]  Cholecalciferol (VITAMIN D) 50 MCG (2000 UT) tablet Take 2,000 Units by mouth daily.   Yes [provider]  ciprofloxacin (CIPRO) 250 MG tablet Take 250 mg by mouth 2 (two) times daily. For 7 days   Yes [provider]  Cranberry 500 MG CAPS Take 500 mg by mouth daily.   Yes [provider]  divalproex (DEPAKOTE SPRINKLE) 125 MG capsule Take 125 mg by mouth every 12 (twelve) hours.    Yes [provider]  escitalopram (LEXAPRO) 10 MG tablet Take 0.5 tablets (5 mg total) by mouth daily. (for mood) Patient taking differently: Take 10 mg by mouth daily. (for mood) 01/14/16  Yes Lada, Janit BernMelinda P, MD  Lactobacillus (ACIDOPHILUS/PECTIN) 100 MG CAPS Take 1 capsule by mouth at bedtime.   Yes [provider]  memantine (NAMENDA) 5 MG tablet Take 1 tablet (5 mg total) by mouth 2 (two) times daily. 04/14/16  Yes Lada, Janit BernMelinda P, MD  metFORMIN (GLUCOPHAGE) 500 MG tablet Take 1 tablet (500 mg total) by mouth 2 (two) times daily with a meal. 06/08/18 05/06/19 Yes Sudini, Srikar, MD  OLANZapine zydis (ZYPREXA) 5 MG disintegrating tablet Take 5 mg by mouth at bedtime.   Yes [provider]  donepezil (ARICEPT) 10 MG tablet Take 1 tablet (10 mg total) by mouth at bedtime. Patient not taking: Reported on 05/06/2019 01/14/16   Kerman PasseyLada, Melinda P, MD  traZODone (DESYREL) 50 MG tablet Take 1 tablet (50 mg total) by mouth at bedtime as needed for  sleep. 03/29/18   Enedina FinnerPatel, Sona, MD    Allergies Patient has no known allergies.  Family History  Problem Relation Age of Onset  . Dementia Mother   . Dementia Father   . Cancer Brother        not sure what type  . Alcohol abuse Brother   . Heart disease Neg Hx   . Diabetes Neg Hx   . Stroke Neg Hx     Social History Social History   Tobacco Use  . Smoking status: Former Smoker    Packs/day: 0.50    Years: 50.00    Pack  years: 25.00    Types: Cigarettes    Quit date: 11/27/1985    Years since quitting: 33.4  . Smokeless tobacco: Never Used  Substance Use Topics  . Alcohol use: No  . Drug use: No    Review of Systems Level 5 chart caveat; no further history available due to patient status.   ____________________________________________   PHYSICAL EXAM:  VITAL SIGNS: ED Triage Vitals [05/06/19 1628]  Enc Vitals Group     BP (!) 145/94     Pulse Rate 71     Resp 12     Temp (!) 97.2 F (36.2 C)     Temp Source Axillary     SpO2 97 %     Weight      Height      Head Circumference      Peak Flow      Pain Score      Pain Loc      Pain Edu?      Excl. in GC?     Constitutional: Been in no acute distress Eyes: Conjunctivae are normal Head: Atraumatic HEENT: No congestion/rhinnorhea. Mucous membranes are moist.  Oropharynx non-erythematous Neck:   Nontender with no meningismus, no masses, no stridor Cardiovascular: Normal rate, regular rhythm. Grossly normal heart sounds.  Good peripheral circulation. Respiratory: Normal respiratory effort.  No retractions. Lungs CTAB. Abdominal: Soft and nontender. No distention. No guarding no rebound Back:  There is no focal tenderness or step off.  there is no midline tenderness there are no lesions noted. there is no CVA tenderness Musculoskeletal: No lower extremity tenderness, no upper extremity tenderness. No joint effusions, no DVT signs strong distal pulses no edema Neurologic: Neurologic exam, patient noncompliant Skin:  Skin is warm, dry and intact. No rash noted.   ____________________________________________   LABS (all labs ordered are listed, but only abnormal results are displayed)  Labs Reviewed  BASIC METABOLIC PANEL - Abnormal; Notable for the following components:      Result Value   Glucose, Bld 111 (*)    All other components within normal limits  CBC WITH DIFFERENTIAL/PLATELET - Abnormal; Notable for the following  components:   Hemoglobin 12.7 (*)    RDW 17.8 (*)    All other components within normal limits  URINALYSIS, COMPLETE (UACMP) WITH MICROSCOPIC - Abnormal; Notable for the following components:   Color, Urine COLORLESS (*)    APPearance CLEAR (*)    Specific Gravity, Urine 1.001 (*)    All other components within normal limits    Pertinent labs  results that were available during my care of the patient were reviewed by me and considered in my medical decision making (see chart for details). ____________________________________________  EKG  I personally interpreted any EKGs ordered by me or triage Rate 67, normal axis, nonspecific Dennis changes, slight artifact limits interpretation but appears to be  sinus rhythm not flutter. ____________________________________________  RADIOLOGY  Pertinent labs & imaging results that were available during my care of the patient were reviewed by me and considered in my medical decision making (see chart for details). If possible, patient and/or family made aware of any abnormal findings.  No results found. ____________________________________________    PROCEDURES  Procedure(s) performed: None  Procedures  Critical Care performed: None  ____________________________________________   INITIAL IMPRESSION / ASSESSMENT AND PLAN / ED COURSE  Pertinent labs & imaging results that were available during my care of the patient were reviewed by me and considered in my medical decision making (see chart for details).  Patient here because family is concerned that he is getting gradually weaker over the last 3 weeks to a month.  He is well-appearing although he is quite demented.  No evidence of UTI basic blood work reassuring, we will obtain CT scan of his head is "CVA certainly cannot be ruled out on the basis of neurologic exam alone and we will obtain chest x-ray if that is negative we will encourage.  Patient follow-up with PCP.  Have extensive  discussion with family, they do think this is a gradual progression for him, they do not wish him to be in the emergency room anymore because of the threat of coronavirus, patient has no red flags that would direct my care any further and he does have close outpatient follow-up I advised the family to follow closely with primary care for continued investigation into his gradual decline     ____________________________________________   FINAL CLINICAL IMPRESSION(S) / ED DIAGNOSES  Final diagnoses:  Weakness      This chart was dictated using voice recognition software.  Despite best efforts to proofread,  errors can occur which can change meaning.      Schuyler Amor, MD 05/06/19 1846    Schuyler Amor, MD 05/06/19 1940    Schuyler Amor, MD 05/06/19 2005

## 2019-05-07 DIAGNOSIS — R531 Weakness: Secondary | ICD-10-CM | POA: Diagnosis not present

## 2019-05-07 DIAGNOSIS — I10 Essential (primary) hypertension: Secondary | ICD-10-CM | POA: Diagnosis not present

## 2019-05-07 DIAGNOSIS — R269 Unspecified abnormalities of gait and mobility: Secondary | ICD-10-CM | POA: Diagnosis not present

## 2019-05-07 DIAGNOSIS — E119 Type 2 diabetes mellitus without complications: Secondary | ICD-10-CM | POA: Diagnosis not present

## 2019-05-07 LAB — URINE CULTURE: Culture: NO GROWTH

## 2019-05-15 ENCOUNTER — Other Ambulatory Visit: Payer: Self-pay

## 2019-05-15 ENCOUNTER — Emergency Department: Payer: Medicare Other

## 2019-05-15 ENCOUNTER — Emergency Department
Admission: EM | Admit: 2019-05-15 | Discharge: 2019-05-15 | Disposition: A | Discharge status: Home / Self Care | Payer: Medicare Other | Attending: Emergency Medicine | Admitting: Emergency Medicine

## 2019-05-15 DIAGNOSIS — M2550 Pain in unspecified joint: Secondary | ICD-10-CM | POA: Diagnosis not present

## 2019-05-15 DIAGNOSIS — Y939 Activity, unspecified: Secondary | ICD-10-CM | POA: Diagnosis not present

## 2019-05-15 DIAGNOSIS — R404 Transient alteration of awareness: Secondary | ICD-10-CM | POA: Diagnosis not present

## 2019-05-15 DIAGNOSIS — W19XXXA Unspecified fall, initial encounter: Secondary | ICD-10-CM | POA: Diagnosis not present

## 2019-05-15 DIAGNOSIS — Z79899 Other long term (current) drug therapy: Secondary | ICD-10-CM | POA: Insufficient documentation

## 2019-05-15 DIAGNOSIS — Z7984 Long term (current) use of oral hypoglycemic drugs: Secondary | ICD-10-CM | POA: Insufficient documentation

## 2019-05-15 DIAGNOSIS — F0280 Dementia in other diseases classified elsewhere without behavioral disturbance: Secondary | ICD-10-CM | POA: Insufficient documentation

## 2019-05-15 DIAGNOSIS — Z043 Encounter for examination and observation following other accident: Secondary | ICD-10-CM | POA: Insufficient documentation

## 2019-05-15 DIAGNOSIS — I10 Essential (primary) hypertension: Secondary | ICD-10-CM | POA: Insufficient documentation

## 2019-05-15 DIAGNOSIS — S0990XA Unspecified injury of head, initial encounter: Secondary | ICD-10-CM | POA: Diagnosis not present

## 2019-05-15 DIAGNOSIS — Y92122 Bedroom in nursing home as the place of occurrence of the external cause: Secondary | ICD-10-CM | POA: Insufficient documentation

## 2019-05-15 DIAGNOSIS — Y999 Unspecified external cause status: Secondary | ICD-10-CM | POA: Insufficient documentation

## 2019-05-15 DIAGNOSIS — G309 Alzheimer's disease, unspecified: Secondary | ICD-10-CM | POA: Diagnosis not present

## 2019-05-15 DIAGNOSIS — R296 Repeated falls: Secondary | ICD-10-CM | POA: Diagnosis not present

## 2019-05-15 DIAGNOSIS — S199XXA Unspecified injury of neck, initial encounter: Secondary | ICD-10-CM | POA: Diagnosis not present

## 2019-05-15 DIAGNOSIS — R531 Weakness: Secondary | ICD-10-CM | POA: Diagnosis not present

## 2019-05-15 DIAGNOSIS — Z7401 Bed confinement status: Secondary | ICD-10-CM | POA: Diagnosis not present

## 2019-05-15 DIAGNOSIS — Z87891 Personal history of nicotine dependence: Secondary | ICD-10-CM | POA: Diagnosis not present

## 2019-05-15 DIAGNOSIS — I440 Atrioventricular block, first degree: Secondary | ICD-10-CM | POA: Diagnosis not present

## 2019-05-15 LAB — URINALYSIS, ROUTINE W REFLEX MICROSCOPIC
Bilirubin Urine: NEGATIVE
Glucose, UA: NEGATIVE mg/dL
Hgb urine dipstick: NEGATIVE
Ketones, ur: NEGATIVE mg/dL
Leukocytes,Ua: NEGATIVE
Nitrite: NEGATIVE
Protein, ur: NEGATIVE mg/dL
Specific Gravity, Urine: 1.01 (ref 1.005–1.030)
pH: 6 (ref 5.0–8.0)

## 2019-05-15 LAB — BASIC METABOLIC PANEL
Anion gap: 6 (ref 5–15)
BUN: 13 mg/dL (ref 8–23)
CO2: 29 mmol/L (ref 22–32)
Calcium: 8.9 mg/dL (ref 8.9–10.3)
Chloride: 102 mmol/L (ref 98–111)
Creatinine, Ser: 0.9 mg/dL (ref 0.61–1.24)
GFR calc Af Amer: >60 mL/min (ref >60)
GFR calc non Af Amer: >60 mL/min (ref >60)
Glucose, Bld: 120 mg/dL — ABNORMAL HIGH (ref 70–99)
Potassium: 3.8 mmol/L (ref 3.5–5.1)
Sodium: 137 mmol/L (ref 135–145)

## 2019-05-15 LAB — CBC
HCT: 40.7 % (ref 39.0–52.0)
Hemoglobin: 12.9 g/dL — ABNORMAL LOW (ref 13.0–17.0)
MCH: 26.5 pg (ref 26.0–34.0)
MCHC: 31.7 g/dL (ref 30.0–36.0)
MCV: 83.6 fL (ref 80.0–100.0)
Platelets: 260 10*3/uL (ref 150–400)
RBC: 4.87 MIL/uL (ref 4.22–5.81)
RDW: 18.1 % — ABNORMAL HIGH (ref 11.5–15.5)
WBC: 10.3 10*3/uL (ref 4.0–10.5)
nRBC: 0 % (ref 0.0–0.2)

## 2019-05-15 LAB — TROPONIN I (HIGH SENSITIVITY): Troponin I (High Sensitivity): 3 ng/L (ref <18)

## 2019-05-15 NOTE — Discharge Instructions (Signed)
You have been seen in the Emergency Department (ED) today for a fall.  Your work up does not show any concerning injuries.   ? ?Please follow up with your doctor regarding today's Emergency Department (ED) visit and your recent fall.   ? ?Return to the ED if you have any headache, confusion, slurred speech, weakness/numbness of any arm or leg, or any increased pain. ? ?

## 2019-05-15 NOTE — ED Provider Notes (Signed)
[facility] Emergency Department Provider Note  ____________________________________________   First MD Initiated Contact with Patient 05/15/19 0210     (approximate)  I have reviewed the triage vital signs and the nursing notes.   HISTORY  Chief Complaint Fall  Level 5 caveat:  history/ROS limited by chronic dementia  HPI Dennis Sandoval is a 78 y.o. male with medical history as listed below who has a recent history of falls and multiple ED visits (this is his fifth visit in 6 months within the [facility]).  He presents by EMS tonight for evaluation of an apparent unwitnessed fall.  EMS was called to [facility] after the staff apparently found the patient on the floor.  It is unclear how long he was there.  He is in no distress and reports no pain.  He will answer simple questions and he denies pain.  He says nothing is wrong but he is unable to provide any history.         Past Medical History:  Diagnosis Date  . Alzheimer disease (HCC) 06/16/2015  . Dementia (HCC)   . Diabetes mellitus without complication (HCC)   . Hypertension   . UTI (urinary tract infection)     Patient Active Problem List   Diagnosis Date Noted  . Altered mental status 06/07/2018  . UTI (urinary tract infection) 06/05/2018  . Sepsis (HCC) 03/27/2018  . 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 History of BPH 12/21/2016  . Medication monitoring encounter 01/28/2016  . Hyperglycemia 01/28/2016  . Preventative health care 12/21/2015  . Need for pneumococcal vaccination 12/21/2015  . Screening for AAA (abdominal aortic aneurysm) 12/01/2015  . Smoking history 11/28/2015  . Dementia (HCC) 07/25/2015  . Benign hypertension 07/25/2015    Past Surgical History:  Procedure Laterality Date  . PROSTATE SURGERY      Prior to Admission medications   Medication Sig Start Date End Date Taking? Authorizing Provider  atorvastatin  (LIPITOR) 10 MG tablet Take 10 mg by mouth daily.    [provider]  Cholecalciferol (VITAMIN D) 50 MCG (2000 UT) tablet Take 2,000 Units by mouth daily.    [provider]  ciprofloxacin (CIPRO) 250 MG tablet Take 250 mg by mouth 2 (two) times daily. For 7 days    [provider]  Cranberry 500 MG CAPS Take 500 mg by mouth daily.    [provider]  divalproex (DEPAKOTE SPRINKLE) 125 MG capsule Take 125 mg by mouth every 12 (twelve) hours.     [provider]  donepezil (ARICEPT) 10 MG tablet Take 1 tablet (10 mg total) by mouth at bedtime. Patient not taking: Reported on 05/06/2019 01/14/16   Kerman PasseyLada, Melinda P, MD  escitalopram (LEXAPRO) 10 MG tablet Take 0.5 tablets (5 mg total) by mouth daily. (for mood) Patient taking differently: Take 10 mg by mouth daily. (for mood) 01/14/16   Lada, Janit BernMelinda P, MD  Lactobacillus (ACIDOPHILUS/PECTIN) 100 MG CAPS Take 1 capsule by mouth at bedtime.    [provider]  memantine (NAMENDA) 5 MG tablet Take 1 tablet (5 mg total) by mouth 2 (two) times daily. 04/14/16   Kerman PasseyLada, Melinda P, MD  metFORMIN (GLUCOPHAGE) 500 MG tablet Take 1 tablet (500 mg total) by mouth 2 (two) times daily with a meal. 06/08/18 05/06/19  Sudini, Wardell HeathSrikar, MD  OLANZapine zydis (ZYPREXA) 5 MG disintegrating tablet Take 5 mg by mouth at bedtime.    [provider]  traZODone (DESYREL) 50 MG tablet Take 1 tablet (50 mg total) by mouth at bedtime as needed for sleep. 03/29/18   Fritzi Mandes, MD    Allergies Patient has no known allergies.  Family History  Problem Relation Age of Onset  . Dementia Mother   . Dementia Father   . Cancer Brother        not sure what type  . Alcohol abuse Brother   . Heart disease Neg Hx   . Diabetes Neg Hx   . Stroke Neg Hx     Social History Social History   Tobacco Use  . Smoking status: Former Smoker    Packs/day: 0.50    Years: 50.00    Pack years: 25.00    Types: Cigarettes    Quit date:  11/27/1985    Years since quitting: 33.4  . Smokeless tobacco: Never Used  Substance Use Topics  . Alcohol use: No  . Drug use: No    Review of Systems Level 5 caveat:  history/ROS limited by chronic dementia   ____________________________________________   PHYSICAL EXAM:  VITAL SIGNS: ED Triage Vitals  Enc Vitals Group     BP 05/15/19 0149 (!) 147/83     Pulse Rate 05/15/19 0149 81     Resp 05/15/19 0149 18     Temp 05/15/19 0149 98 F (36.7 C)     Temp Source 05/15/19 0149 Oral     SpO2 05/15/19 0146 97 %     Weight 05/15/19 0149 89 kg (196 lb 3.4 oz)     Height 05/15/19 0149 1.829 m (6')     Head Circumference --      Peak Flow --      Pain Score --      Pain Loc --      Pain Edu? --      Excl. in [place]? --     Constitutional: Sleeping comfortably, awakens normal tone of voice, answers simple questions with yes and no and denies pain.  Chronic dementia. Eyes: Conjunctivae are normal.  Head: Atraumatic. Nose: No congestion/rhinnorhea. Mouth/Throat: Mucous membranes are moist. Neck: No stridor.  No meningeal signs.   Cardiovascular: Normal rate, regular rhythm. Good peripheral circulation. Grossly normal heart sounds. Respiratory: Normal respiratory effort.  No retractions. No audible wheezing. Gastrointestinal: Soft and nontender. No distention.  Musculoskeletal: No pain or tenderness with range of motion of upper and lower extremities.  No tenderness to palpation or "rocking" of the pelvis. Neurologic:  Normal speech and language. No gross focal neurologic deficits are appreciated.  Skin:  Skin is warm, dry and intact. No rash noted.   ____________________________________________   LABS (all labs ordered are listed, but only abnormal results are displayed)  Labs Reviewed  BASIC METABOLIC PANEL - Abnormal; Notable for the following components:      Result Value   Glucose, Bld 120 (*)    All other components within normal limits  CBC - Abnormal; Notable for  the following components:   Hemoglobin 12.9 (*)    RDW 18.1 (*)    All other components within normal limits  URINALYSIS, ROUTINE W REFLEX MICROSCOPIC - Abnormal; Notable for the following components:   Color, Urine YELLOW (*)    APPearance CLEAR (*)    All other components within normal limits  URINE CULTURE  TROPONIN I (HIGH SENSITIVITY)   ____________________________________________  EKG  ED ECG REPORT I, Hinda Kehr, the attending physician, personally viewed and interpreted this ECG.  Date: 05/15/2019 EKG  Time: 1:56 AM Rate: 75 Rhythm: Sinus rhythm with first-degree AV block  QRS Axis: normal Intervals: PR interval of 58 ms, QTC normal at 470 ms Dennis/T Wave abnormalities: Non-specific Dennis segment / T-wave changes, but no clear evidence of acute ischemia. Narrative Interpretation: no definitive evidence of acute ischemia; does not meet STEMI criteria.   ____________________________________________  RADIOLOGY   ED MD interpretation:  No acute abnormalities on CT head/c-spine  Official radiology report(s): Ct Head Wo Contrast  Result Date: 05/15/2019 CLINICAL DATA:  Unwitnessed fall.  Alzheimer's EXAM: CT HEAD WITHOUT CONTRAST CT CERVICAL SPINE WITHOUT CONTRAST TECHNIQUE: Multidetector CT imaging of the head and cervical spine was performed following the standard protocol without intravenous contrast. Multiplanar CT image reconstructions of the cervical spine were also generated. COMPARISON:  05/06/2019 head CT FINDINGS: CT HEAD FINDINGS Brain: No evidence of acute infarction, hemorrhage, hydrocephalus, extra-axial collection or mass lesion/mass effect. Atrophy most notable in the temporal lobes. Small vessel ischemic gliosis in the periventricular white matter. Small remote left cerebellar infarct. Vascular: No hyperdense vessel or unexpected calcification. Skull: Normal. Negative for fracture or focal lesion. Sinuses/Orbits: No evidence of injury CT CERVICAL SPINE FINDINGS  Alignment: No traumatic malalignment Skull base and vertebrae: Negative for fracture Soft tissues and spinal canal: No prevertebral fluid or swelling. No visible canal hematoma. Disc levels: Spondylosis with moderate to bulky ventral spurring from C4 to C7. Upper chest: Negative IMPRESSION: 1. No evidence of acute intracranial or cervical spine injury. 2. Atrophy in keeping with history of Alzheimer's disease. Electronically Signed   By: Marnee SpringJonathon  Watts M.D.   On: 05/15/2019 04:12   Ct Cervical Spine Wo Contrast  Result Date: 05/15/2019 CLINICAL DATA:  Unwitnessed fall.  Alzheimer's EXAM: CT HEAD WITHOUT CONTRAST CT CERVICAL SPINE WITHOUT CONTRAST TECHNIQUE: Multidetector CT imaging of the head and cervical spine was performed following the standard protocol without intravenous contrast. Multiplanar CT image reconstructions of the cervical spine were also generated. COMPARISON:  05/06/2019 head CT FINDINGS: CT HEAD FINDINGS Brain: No evidence of acute infarction, hemorrhage, hydrocephalus, extra-axial collection or mass lesion/mass effect. Atrophy most notable in the temporal lobes. Small vessel ischemic gliosis in the periventricular white matter. Small remote left cerebellar infarct. Vascular: No hyperdense vessel or unexpected calcification. Skull: Normal. Negative for fracture or focal lesion. Sinuses/Orbits: No evidence of injury CT CERVICAL SPINE FINDINGS Alignment: No traumatic malalignment Skull base and vertebrae: Negative for fracture Soft tissues and spinal canal: No prevertebral fluid or swelling. No visible canal hematoma. Disc levels: Spondylosis with moderate to bulky ventral spurring from C4 to C7. Upper chest: Negative IMPRESSION: 1. No evidence of acute intracranial or cervical spine injury. 2. Atrophy in keeping with history of Alzheimer's disease. Electronically Signed   By: Marnee SpringJonathon  Watts M.D.   On: 05/15/2019 04:12     ____________________________________________   PROCEDURES   Procedure(s) performed (including Critical Care):  Procedures   ____________________________________________   INITIAL IMPRESSION / MDM / ASSESSMENT AND PLAN / ED COURSE  As part of my medical decision making, I reviewed the following data within the electronic MEDICAL RECORD NUMBER Nursing notes reviewed and incorporated, Labs reviewed , EKG interpreted , Old chart reviewed and Notes from prior ED visits      *Dennis Sandoval was evaluated in Emergency Department on 05/15/2019 for the symptoms described in the history of present illness. He was evaluated in the context of the global COVID-19 pandemic, which necessitated consideration that the patient might be at risk for infection with the SARS-CoV-2  virus that causes COVID-19. Institutional protocols and algorithms that pertain to the evaluation of patients at risk for COVID-19 are in a state of rapid change based on information released by regulatory bodies including the CDC and federal and state organizations. These policies and algorithms were followed during the patient's care in the ED.  Some ED evaluations and interventions may be delayed as a result of limited staffing during the pandemic.*  Differential diagnosis includes, but is not limited to, unwitnessed fall or the patient happens off down to the floor, acute intracranial injury, cervical spine injury, extremity injury.  UTI or electrolyte abnormality is also possible.  The patient is in no distress with stable vital signs. Will rule out acute lab abnormalities, UTI, intracranial or c-spine injury, and reassess.  Anticipate d/c back to facility.  Clinical Course as of May 15 415  Tue May 15, 2019  0319 Urinalysis is normal with no evidence of infection  Urinalysis, Routine w reflex microscopic(!) [CF]  [id number] Of note, the patient's labs are all reassuring.  He has a high-sensitivity troponin of 3, normal basic  metabolic panel, and normal CBC.  No evidence of infection in the urine.  Given his otherwise normal and reassuring exam, initially I ordered a CK but I do not think it is necessary at this point particularly given the reassuring urinalysis and physical exam.  I am awaiting the results of the CT scans and then anticipate discharge back to his facility.   [CF]  0415 No evidence of acute abnormality on head nor c-spine CT.  will d/c back to his facility. No indication of emergent medical condition/injury at this time.   [CF]    Clinical Course User Index [CF] Loleta RoseForbach, Maurice Fotheringham, MD     ____________________________________________  FINAL CLINICAL IMPRESSION(S) / ED DIAGNOSES  Final diagnoses:  Fall, initial encounter     MEDICATIONS GIVEN DURING THIS VISIT:  Medications - No data to display   ED Discharge Orders    None       Note:  This document was prepared using Dragon voice recognition software and may include unintentional dictation errors.   Loleta RoseForbach, Jonique Kulig, MD 05/15/19 [phone]

## 2019-05-15 NOTE — ED Notes (Signed)
3rd attempt to contact [facility] unsuccessful

## 2019-05-15 NOTE — ED Triage Notes (Addendum)
Pt arrives to ED via [facility] from [facility] s/p unwitnessed fall. EMS states they were called to facility in regards to a pt that fell; unknown how long the pt was on the floor or if pt experienced any LOC. Pt is at baseline per EMS with a h/x of dementia and Alzheimer's.

## 2019-05-16 LAB — URINE CULTURE
Culture: NO GROWTH
Special Requests: NORMAL

## 2019-05-24 DIAGNOSIS — E119 Type 2 diabetes mellitus without complications: Secondary | ICD-10-CM | POA: Diagnosis not present

## 2019-05-24 DIAGNOSIS — I10 Essential (primary) hypertension: Secondary | ICD-10-CM | POA: Diagnosis not present

## 2019-05-24 DIAGNOSIS — F5109 Other insomnia not due to a substance or known physiological condition: Secondary | ICD-10-CM | POA: Diagnosis not present

## 2019-06-11 DIAGNOSIS — I10 Essential (primary) hypertension: Secondary | ICD-10-CM | POA: Diagnosis not present

## 2019-06-11 DIAGNOSIS — F5109 Other insomnia not due to a substance or known physiological condition: Secondary | ICD-10-CM | POA: Diagnosis not present

## 2019-06-11 DIAGNOSIS — E119 Type 2 diabetes mellitus without complications: Secondary | ICD-10-CM | POA: Diagnosis not present

## 2019-06-19 ENCOUNTER — Encounter: Payer: Self-pay | Admitting: Emergency Medicine

## 2019-06-19 ENCOUNTER — Other Ambulatory Visit: Payer: Self-pay

## 2019-06-19 ENCOUNTER — Emergency Department
Admission: EM | Admit: 2019-06-19 | Discharge: 2019-06-19 | Disposition: A | Discharge status: Home / Self Care | Payer: Medicare Other | Attending: Emergency Medicine | Admitting: Emergency Medicine

## 2019-06-19 DIAGNOSIS — F0280 Dementia in other diseases classified elsewhere without behavioral disturbance: Secondary | ICD-10-CM | POA: Insufficient documentation

## 2019-06-19 DIAGNOSIS — R279 Unspecified lack of coordination: Secondary | ICD-10-CM | POA: Diagnosis not present

## 2019-06-19 DIAGNOSIS — R404 Transient alteration of awareness: Secondary | ICD-10-CM | POA: Diagnosis not present

## 2019-06-19 DIAGNOSIS — Z87891 Personal history of nicotine dependence: Secondary | ICD-10-CM | POA: Insufficient documentation

## 2019-06-19 DIAGNOSIS — R410 Disorientation, unspecified: Secondary | ICD-10-CM | POA: Diagnosis not present

## 2019-06-19 DIAGNOSIS — E119 Type 2 diabetes mellitus without complications: Secondary | ICD-10-CM | POA: Insufficient documentation

## 2019-06-19 DIAGNOSIS — I10 Essential (primary) hypertension: Secondary | ICD-10-CM | POA: Insufficient documentation

## 2019-06-19 DIAGNOSIS — R4182 Altered mental status, unspecified: Secondary | ICD-10-CM | POA: Insufficient documentation

## 2019-06-19 DIAGNOSIS — Z7984 Long term (current) use of oral hypoglycemic drugs: Secondary | ICD-10-CM | POA: Diagnosis not present

## 2019-06-19 DIAGNOSIS — G309 Alzheimer's disease, unspecified: Secondary | ICD-10-CM | POA: Diagnosis not present

## 2019-06-19 DIAGNOSIS — Z743 Need for continuous supervision: Secondary | ICD-10-CM | POA: Diagnosis not present

## 2019-06-19 DIAGNOSIS — Z79899 Other long term (current) drug therapy: Secondary | ICD-10-CM | POA: Diagnosis not present

## 2019-06-19 DIAGNOSIS — R4020 Unspecified coma: Secondary | ICD-10-CM | POA: Diagnosis not present

## 2019-06-19 LAB — COMPREHENSIVE METABOLIC PANEL
ALT: 14 U/L (ref 0–44)
AST: 18 U/L (ref 15–41)
Albumin: 3.7 g/dL (ref 3.5–5.0)
Alkaline Phosphatase: 69 U/L (ref 38–126)
Anion gap: 7 (ref 5–15)
BUN: 15 mg/dL (ref 8–23)
CO2: 27 mmol/L (ref 22–32)
Calcium: 9.1 mg/dL (ref 8.9–10.3)
Chloride: 104 mmol/L (ref 98–111)
Creatinine, Ser: 0.81 mg/dL (ref 0.61–1.24)
GFR calc Af Amer: >60 mL/min (ref >60)
GFR calc non Af Amer: >60 mL/min (ref >60)
Glucose, Bld: 149 mg/dL — ABNORMAL HIGH (ref 70–99)
Potassium: 4.6 mmol/L (ref 3.5–5.1)
Sodium: 138 mmol/L (ref 135–145)
Total Bilirubin: 0.7 mg/dL (ref 0.3–1.2)
Total Protein: 7.8 g/dL (ref 6.5–8.1)

## 2019-06-19 LAB — CBC
HCT: 39.7 % (ref 39.0–52.0)
Hemoglobin: 12.7 g/dL — ABNORMAL LOW (ref 13.0–17.0)
MCH: 26.2 pg (ref 26.0–34.0)
MCHC: 32 g/dL (ref 30.0–36.0)
MCV: 82 fL (ref 80.0–100.0)
Platelets: 260 10*3/uL (ref 150–400)
RBC: 4.84 MIL/uL (ref 4.22–5.81)
RDW: 17.9 % — ABNORMAL HIGH (ref 11.5–15.5)
WBC: 9.4 10*3/uL (ref 4.0–10.5)
nRBC: 0 % (ref 0.0–0.2)

## 2019-06-19 LAB — URINALYSIS, COMPLETE (UACMP) WITH MICROSCOPIC
Bacteria, UA: NONE SEEN
Bilirubin Urine: NEGATIVE
Glucose, UA: 50 mg/dL — AB
Hgb urine dipstick: NEGATIVE
Ketones, ur: NEGATIVE mg/dL
Leukocytes,Ua: NEGATIVE
Nitrite: NEGATIVE
Protein, ur: NEGATIVE mg/dL
Specific Gravity, Urine: 1.018 (ref 1.005–1.030)
pH: 6 (ref 5.0–8.0)

## 2019-06-19 NOTE — ED Notes (Signed)
[facility] contacted about pts pending discharge. Per [facility] pt to be transported back via ems

## 2019-06-19 NOTE — ED Notes (Signed)
Pts son, Marguerite Olea, contacted and updated on pt pending discharge.

## 2019-06-19 NOTE — ED Provider Notes (Signed)
[facility] Emergency Department Provider Note  Time seen: 3:21 PM  I have reviewed the triage vital signs and the nursing notes.   HISTORY  Chief Complaint Altered Mental Status   HPI Dennis Sandoval is a 78 y.o. male with a past medical history of dementia, diabetes, hypertension, presents to the emergency department for altered mental status.  According to EMS report patient was not responding to staff members at his nursing facility so EMS was called.  EMS states upon their arrival patient is awake and alert, acting at baseline.  Does have a history of dementia and cannot contribute to his history or review of systems at this time.  Upon my evaluation patient initially sleeping, awakens to voice, denies any complaints at this time.   Past Medical History:  Diagnosis Date  . Alzheimer disease (HCC) 06/16/2015  . Dementia (HCC)   . Diabetes mellitus without complication (HCC)   . Hypertension   . UTI (urinary tract infection)     Patient Active Problem List   Diagnosis Date Noted  . Altered mental status 06/07/2018  . UTI (urinary tract infection) 06/05/2018  . Sepsis (HCC) 03/27/2018  . 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 History of BPH 12/21/2016  . Medication monitoring encounter 01/28/2016  . Hyperglycemia 01/28/2016  . Preventative health care 12/21/2015  . Need for pneumococcal vaccination 12/21/2015  . Screening for AAA (abdominal aortic aneurysm) 12/01/2015  . Smoking history 11/28/2015  . Dementia (HCC) 07/25/2015  . Benign hypertension 07/25/2015    Past Surgical History:  Procedure Laterality Date  . PROSTATE SURGERY      Prior to Admission medications   Medication Sig Start Date End Date Taking? Authorizing Provider  atorvastatin (LIPITOR) 10 MG tablet Take 10 mg by mouth daily.    [provider]  Cholecalciferol (VITAMIN D) 50 MCG (2000 UT) tablet Take 2,000 Units by  mouth daily.    [provider]  ciprofloxacin (CIPRO) 250 MG tablet Take 250 mg by mouth 2 (two) times daily. For 7 days    [provider]  Cranberry 500 MG CAPS Take 500 mg by mouth daily.    [provider]  divalproex (DEPAKOTE SPRINKLE) 125 MG capsule Take 125 mg by mouth every 12 (twelve) hours.     [provider]  donepezil (ARICEPT) 10 MG tablet Take 1 tablet (10 mg total) by mouth at bedtime. Patient not taking: Reported on 05/06/2019 01/14/16   Kerman PasseyLada, Melinda P, MD  escitalopram (LEXAPRO) 10 MG tablet Take 0.5 tablets (5 mg total) by mouth daily. (for mood) Patient taking differently: Take 10 mg by mouth daily. (for mood) 01/14/16   Lada, Janit BernMelinda P, MD  Lactobacillus (ACIDOPHILUS/PECTIN) 100 MG CAPS Take 1 capsule by mouth at bedtime.    [provider]  memantine (NAMENDA) 5 MG tablet Take 1 tablet (5 mg total) by mouth 2 (two) times daily. 04/14/16   Kerman PasseyLada, Melinda P, MD  metFORMIN (GLUCOPHAGE) 500 MG tablet Take 1 tablet (500 mg total) by mouth 2 (two) times daily with a meal. 06/08/18 05/06/19  Sudini, Wardell HeathSrikar, MD  OLANZapine zydis (ZYPREXA) 5 MG disintegrating tablet Take 5 mg by mouth at bedtime.    [provider]  traZODone (DESYREL) 50 MG tablet Take 1 tablet (50 mg total) by mouth at bedtime as needed for sleep. 03/29/18   Enedina FinnerPatel, Sona, MD    No Known Allergies  Family History  Problem Relation Age of  Onset  . Dementia Mother   . Dementia Father   . Cancer Brother        not sure what type  . Alcohol abuse Brother   . Heart disease Neg Hx   . Diabetes Neg Hx   . Stroke Neg Hx     Social History Social History   Tobacco Use  . Smoking status: Former Smoker    Packs/day: 0.50    Years: 50.00    Pack years: 25.00    Types: Cigarettes    Quit date: 11/27/1985    Years since quitting: 33.5  . Smokeless tobacco: Never Used  Substance Use Topics  . Alcohol use: No  . Drug use: No    Review of Systems Unable to  obtain adequate/accurate review of systems secondary to baseline dementia  ____________________________________________   PHYSICAL EXAM:  VITAL SIGNS: ED Triage Vitals  Enc Vitals Group     BP 06/19/19 1443 (!) 148/99     Pulse Rate 06/19/19 1443 87     Resp 06/19/19 1443 18     Temp 06/19/19 1449 97.8 F (36.6 C)     Temp Source 06/19/19 1449 Oral     SpO2 06/19/19 1443 96 %     Weight 06/19/19 1444 190 lb (86.2 kg)     Height --      Head Circumference --      Peak Flow --      Pain Score --      Pain Loc --      Pain Edu? --      Excl. in [place]? --    Constitutional: Initially sleeping, awakens to voice, denies any complaints at this time.  Does fall asleep shortly after evaluation. Eyes: Normal exam ENT      Head: Normocephalic and atraumatic.      Mouth/Throat: Mucous membranes are moist. Cardiovascular: Normal rate, regular rhythm. Respiratory: Normal respiratory effort without tachypnea nor retractions. Breath sounds are clear  Gastrointestinal: Soft and nontender. No distention.   Musculoskeletal: Nontender with normal range of motion in all extremities. Neurologic:  Normal speech and language. No gross focal neurologic deficits  Skin:  Skin is warm, dry and intact.  Psychiatric: Mood and affect are normal.   ____________________________________________    EKG  EKG viewed and interpreted by myself shows a sinus rhythm 86 bpm with a narrow QRS, left axis deviation, largely normal intervals with nonspecific Dennis changes  ____________________________________________   INITIAL IMPRESSION / ASSESSMENT AND PLAN / ED COURSE  Pertinent labs & imaging results that were available during my care of the patient were reviewed by me and considered in my medical decision making (see chart for details).   Patient presents to the emergency department altered mental status/unresponsiveness.  Patient is somnolent but awakens to voice answers questions, has baseline dementia.   Denies any acute concerns or complaints at this time.  We will check labs, EKG and urinalysis.  As long as the patient continues to appear well anticipate likely discharge to his nursing facility.  Patient's lab work is largely normal.  Patient continues to appear well in the emergency department.  We will discharge her with nursing facility with PCP follow-up.  Dennis Sandoval was evaluated in Emergency Department on 06/19/2019 for the symptoms described in the history of present illness. He was evaluated in the context of the global COVID-19 pandemic, which necessitated consideration that the patient might be at risk for infection with the SARS-CoV-2 virus that causes  COVID-19. Institutional protocols and algorithms that pertain to the evaluation of patients at risk for COVID-19 are in a state of rapid change based on information released by regulatory bodies including the CDC and federal and state organizations. These policies and algorithms were followed during the patient's care in the ED.  ____________________________________________   FINAL CLINICAL IMPRESSION(S) / ED DIAGNOSES  Altered mental status   Harvest Dark, MD 06/19/19 [phone]

## 2019-06-19 NOTE — ED Triage Notes (Signed)
Pt arrived from [facility].  Staff called EMS because they said he was unresponsive. EMS arrived and pt would talk to them, dementia at baseline.  Pt acting like eating, had just finished lunch at facility. BG 152. Pt currently at baseline.

## 2019-06-19 NOTE — ED Notes (Signed)
[facility]  CALLED  FOR  TRANSPORT 

## 2019-06-21 DIAGNOSIS — E785 Hyperlipidemia, unspecified: Secondary | ICD-10-CM | POA: Diagnosis not present

## 2019-06-21 DIAGNOSIS — E119 Type 2 diabetes mellitus without complications: Secondary | ICD-10-CM | POA: Diagnosis not present

## 2019-07-10 ENCOUNTER — Other Ambulatory Visit: Payer: Self-pay

## 2019-07-10 NOTE — Patient Outreach (Signed)
[place]) Care Management  07/10/2019  [place] [birth year] [id number]   Medication Adherence call to Mr. [place] patient is at a long term assistance living patient is showing  past due on Metformin 500 mg under [place].   [place] Management Direct Dial [phone]  Fax [phone] [email or web address] [email or web address]

## 2019-07-17 DIAGNOSIS — E785 Hyperlipidemia, unspecified: Secondary | ICD-10-CM | POA: Diagnosis not present

## 2019-07-17 DIAGNOSIS — E119 Type 2 diabetes mellitus without complications: Secondary | ICD-10-CM | POA: Diagnosis not present

## 2019-07-20 IMAGING — CR DG CHEST 1V PORT
1 series · 1 of 1 positions shown · non-contrast
Comparison: None.

CLINICAL DATA: Status post unwitnessed fall, with concern for chest
injury. Initial encounter.

EXAM:
PORTABLE CHEST 1 VIEW

[dg chest port 1 view]
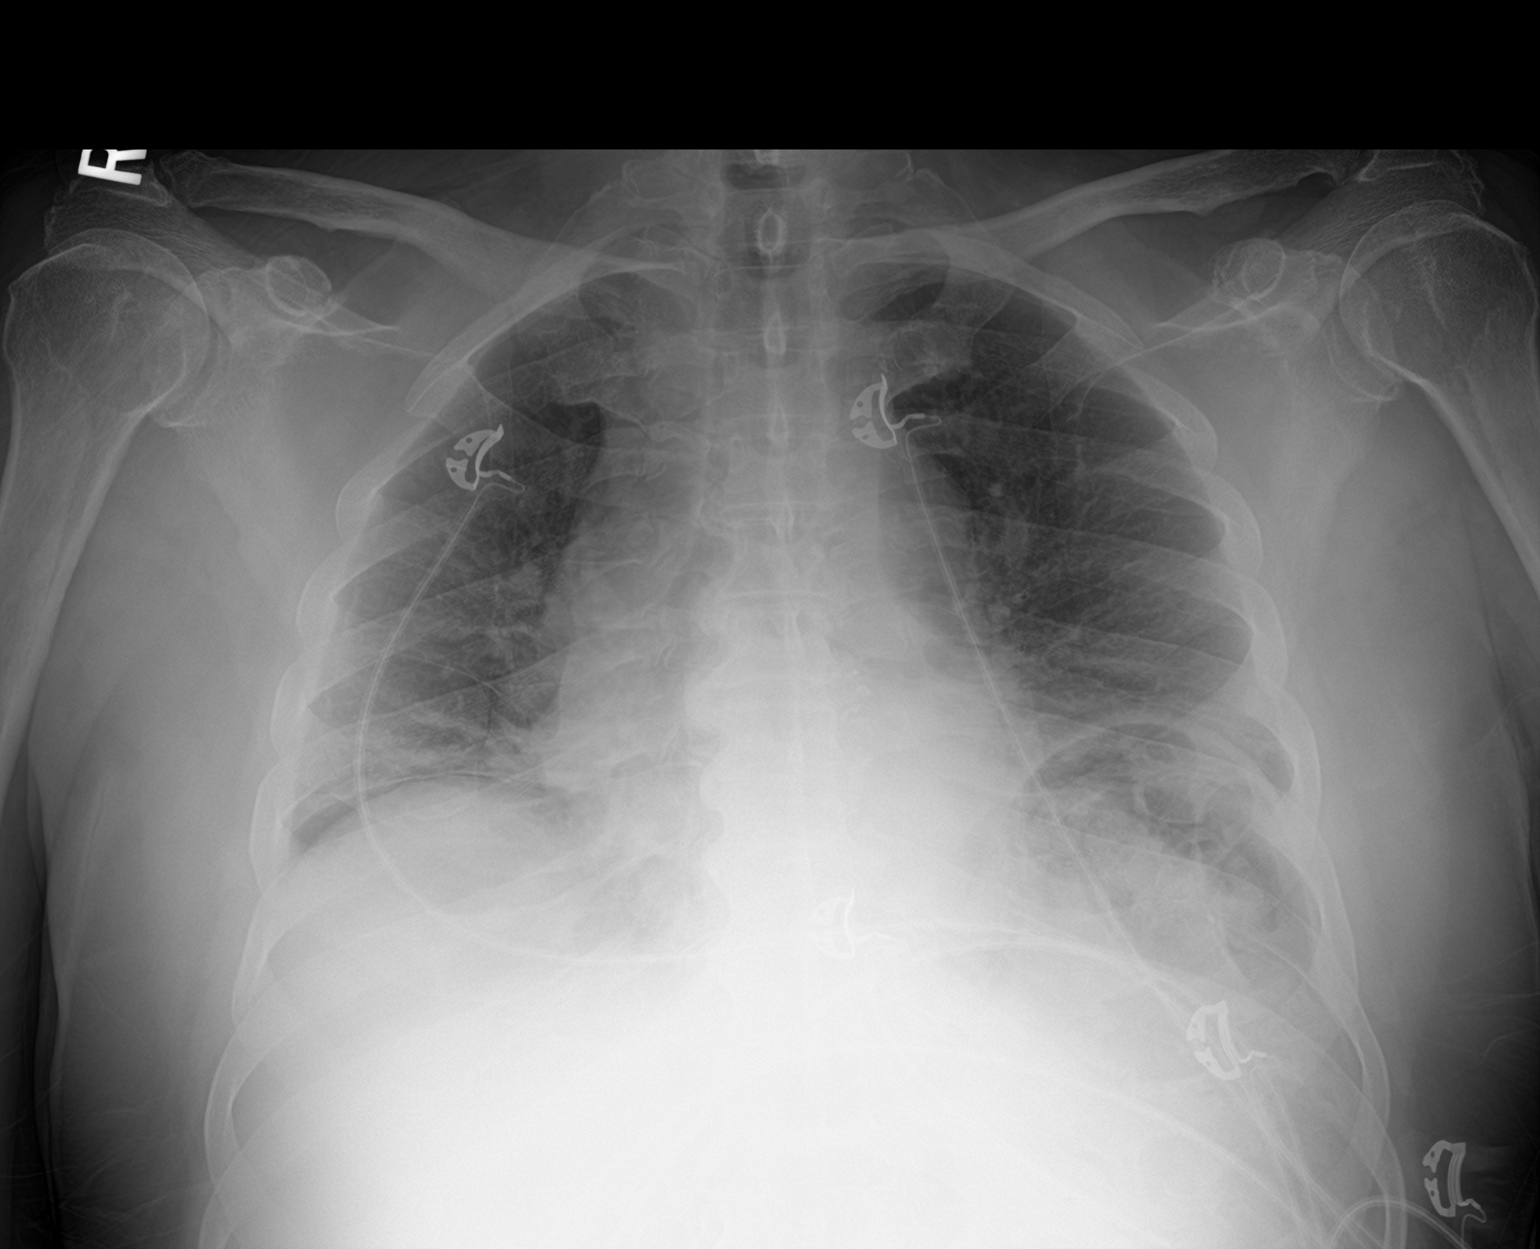

[1 of 1 positions shown; findings below may reference images not displayed]

FINDINGS: The lungs are hypoexpanded. Mild bibasilar airspace opacities may
reflect atelectasis or possibly pneumonia. There is no evidence of
pleural effusion or pneumothorax.

The cardiomediastinal silhouette is within normal limits. No acute
osseous abnormalities are seen.
IMPRESSION: Lungs hypoexpanded. Mild bibasilar airspace opacities may reflect
atelectasis or possibly pneumonia. No displaced rib fracture seen.

## 2019-07-20 IMAGING — CT CT CERVICAL SPINE W/O CM
3 of 7 series · 11 of 33 positions shown, 13 images · non-contrast
Comparison: 03/26/2018 MR brain and head CT.

CLINICAL DATA: 76-year-old male post fall. Denies hitting head.
History of dementia. Initial encounter.

EXAM:
CT HEAD WITHOUT CONTRAST
CT CERVICAL SPINE WITHOUT CONTRAST
TECHNIQUE: Multidetector CT imaging of the head and cervical spine was
performed following the standard protocol without intravenous
contrast. Multiplanar CT image reconstructions of the cervical spine
were also generated.

[Series 4: sagittal bone · sagittal · 0.22mm/px · 5 of 70 slices shown]
[im 10/70  bone]
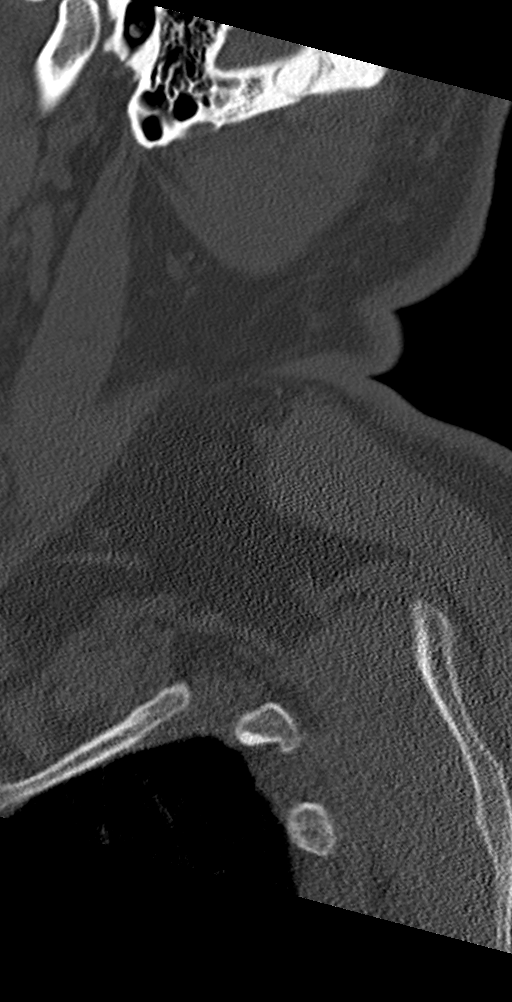
[im 20/70  bone]
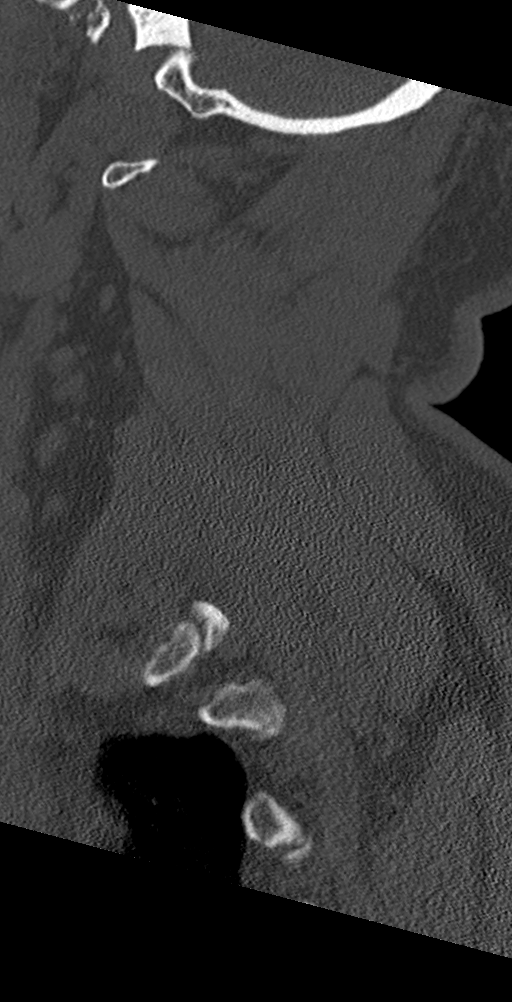
[im 30/70  bone]
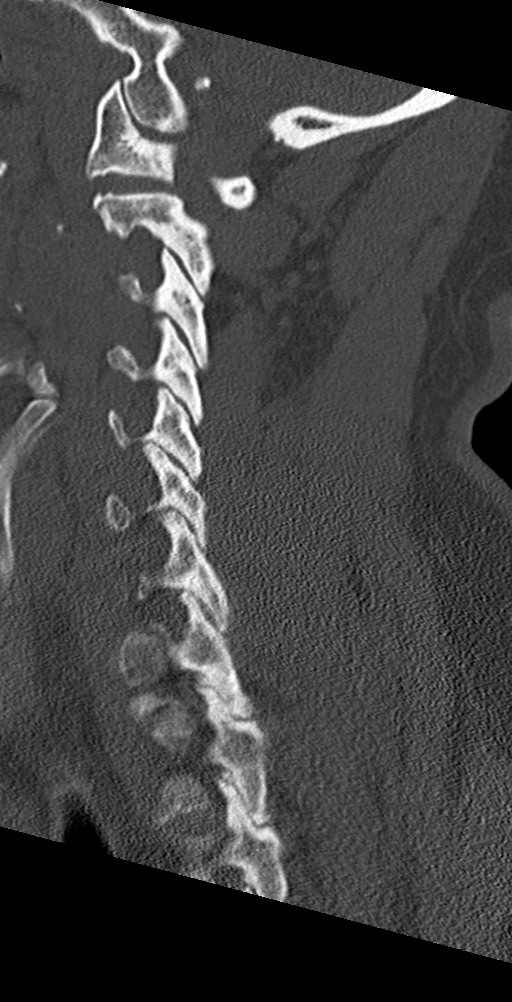
[im 40/70  bone]
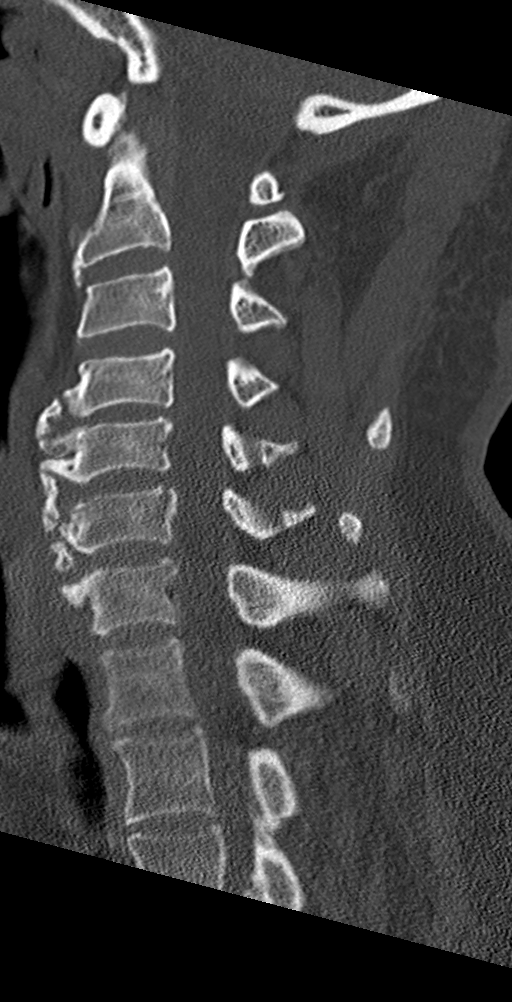
[im 50/70  bone]
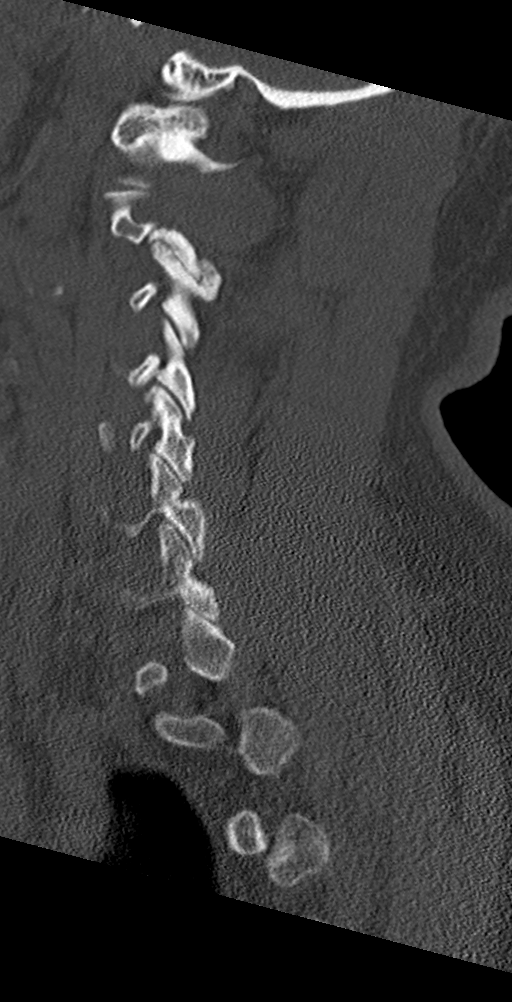

[Series 11: coronal bone · coronal · 0.27mm/px · 1 of 58 slices shown]
[im 29/58  bone]
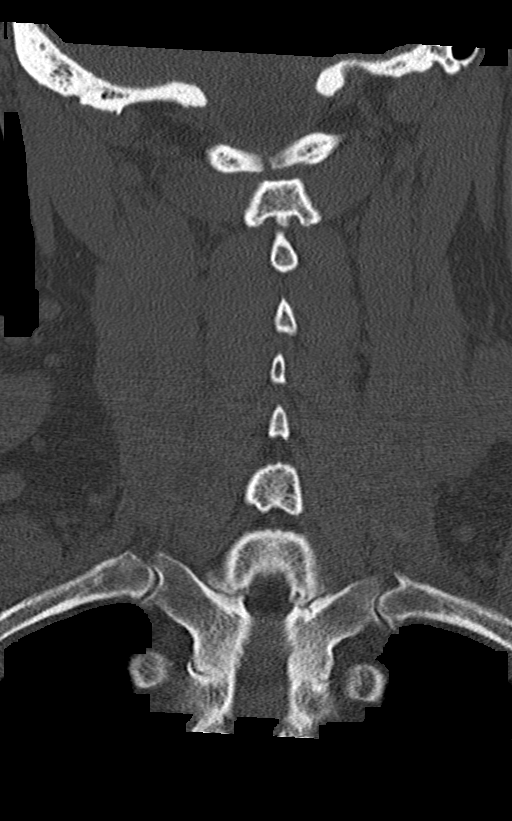

[Series 12: orthogonal bone · axial · 0.22mm/px · z∈[-272,-133]mm · 5 of 110 slices shown, 7 images]
[im 19/110  soft-tissue]
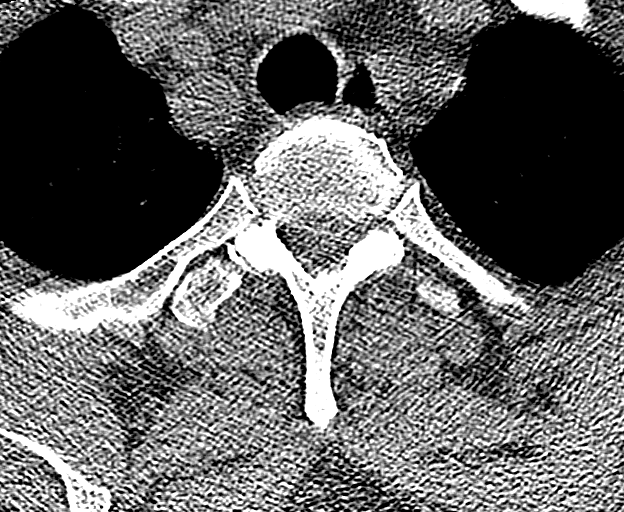
[im 19/110  bone]
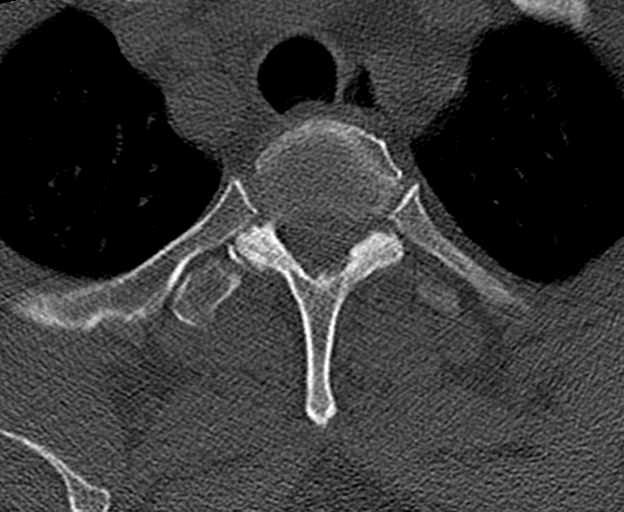
[im 37/110  bone]
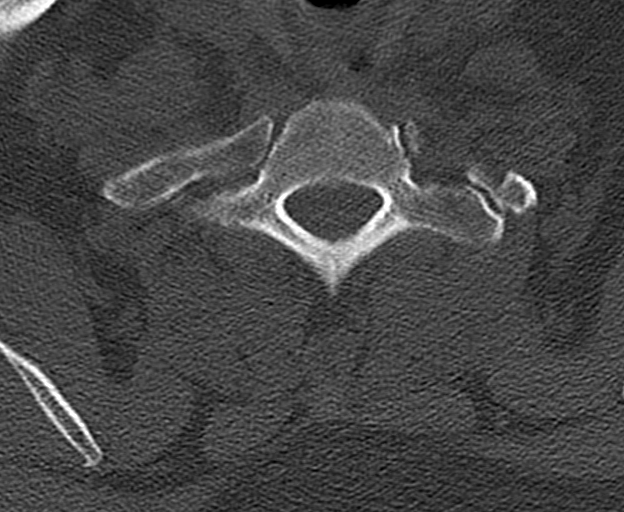
[im 55/110  bone]
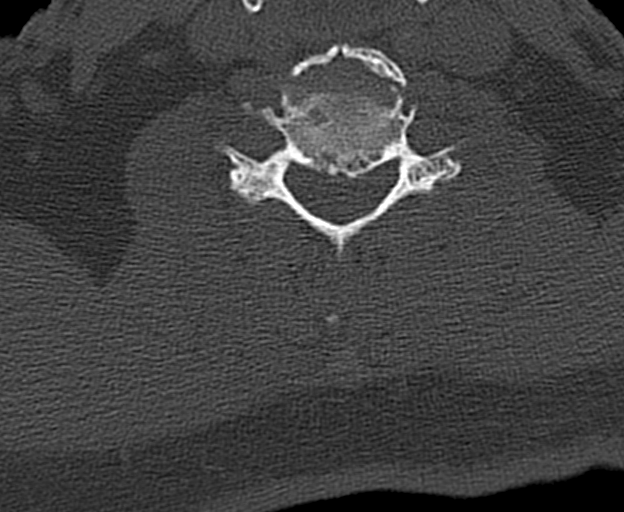
[im 73/110  bone]
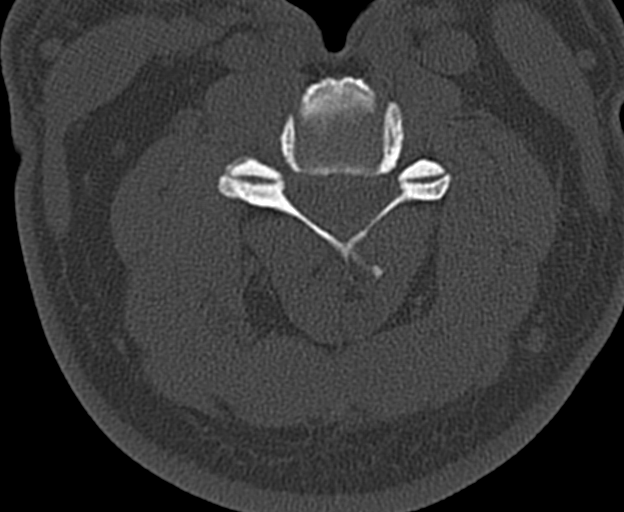
[im 91/110  soft-tissue]
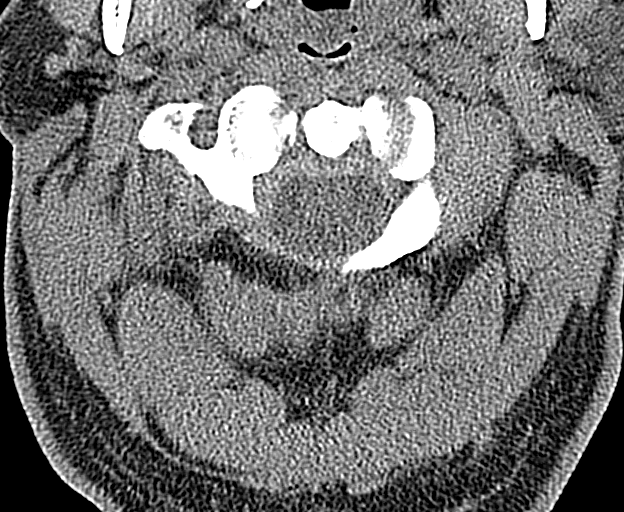
[im 91/110  bone]
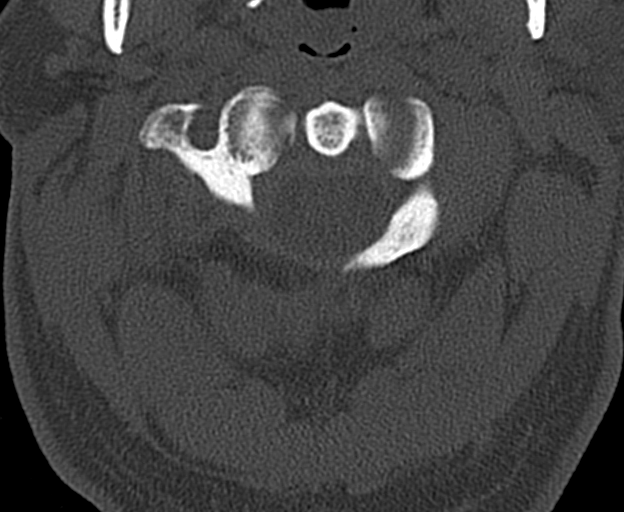

[11 of 33 positions shown; findings below may reference images not displayed]

FINDINGS: CT HEAD FINDINGS

Brain: No intracranial hemorrhage or CT evidence of large acute
infarct.

Chronic microvascular changes.

Atrophy.

No intracranial mass lesion noted on this unenhanced exam.

Vascular: Vascular calcifications.

Skull: No skull fracture.

Sinuses/Orbits: No acute orbital abnormality. Remote right medial
orbital wall fracture. Visualized paranasal sinuses clear.

Other: Mastoid air cells and middle ear cavities are clear.

CT CERVICAL SPINE FINDINGS

Alignment: Normal alignment.

Skull base and vertebrae: No cervical spine fracture.

Congenital nonunion posterior C1 ring.

Soft tissues and spinal canal: No abnormal prevertebral soft tissue
swelling.

Disc levels: Cervical spondylotic changes with various degrees
spinal stenosis and foraminal narrowing C3-4 through C6-7.

Upper chest: No worrisome abnormality.

Other: Carotid bifurcation calcification with carotid arteries
deviated medially.
IMPRESSION: No skull fracture or intracranial hemorrhage.

No cervical spine fracture, malalignment or abnormal prevertebral
soft tissue swelling.

Chronic changes as noted above.

## 2019-07-28 DIAGNOSIS — E1151 Type 2 diabetes mellitus with diabetic peripheral angiopathy without gangrene: Secondary | ICD-10-CM | POA: Diagnosis not present

## 2019-07-28 DIAGNOSIS — B351 Tinea unguium: Secondary | ICD-10-CM | POA: Diagnosis not present

## 2019-07-31 ENCOUNTER — Other Ambulatory Visit: Payer: Self-pay

## 2019-07-31 DIAGNOSIS — E119 Type 2 diabetes mellitus without complications: Secondary | ICD-10-CM | POA: Diagnosis not present

## 2019-07-31 DIAGNOSIS — I10 Essential (primary) hypertension: Secondary | ICD-10-CM | POA: Diagnosis not present

## 2019-07-31 DIAGNOSIS — F5109 Other insomnia not due to a substance or known physiological condition: Secondary | ICD-10-CM | POA: Diagnosis not present

## 2019-07-31 NOTE — Patient Outreach (Signed)
[facility]) Care Management  07/31/2019  Dennis Sandoval 10-16-1941 [id number]   Medication Adherence call to [name] patient is at a long term home care facility and they provide with all his medications. [name] is showing past due on Metformin 500 mg under [place].  [facility] Management Direct Dial [phone]  Fax [phone] [email or web address]

## 2019-08-15 DIAGNOSIS — F5109 Other insomnia not due to a substance or known physiological condition: Secondary | ICD-10-CM | POA: Diagnosis not present

## 2019-08-15 DIAGNOSIS — I10 Essential (primary) hypertension: Secondary | ICD-10-CM | POA: Diagnosis not present

## 2019-08-15 DIAGNOSIS — E119 Type 2 diabetes mellitus without complications: Secondary | ICD-10-CM | POA: Diagnosis not present

## 2020-01-02 ENCOUNTER — Other Ambulatory Visit: Payer: Self-pay

## 2020-01-02 NOTE — Patient Outreach (Signed)
[facility] service manager [facility]) Care Management  01/02/2020  Dennis Sandoval [name] Mar 27, 1941 [id number]   Medication Adherence call to Dennis Sandoval patient did not answer but, last time when we spoke with patient he explain he is a long term facility. Dennis Sandoval is showing past due on Metformin 500 mg under [employer].   Lillia Abed CPhT Pharmacy Technician [facility] Care Management Direct Dial [phone]  Fax [phone] [email or web address]

## 2020-08-28 IMAGING — DX PORTABLE CHEST - 1 VIEW
1 series · 1 of 1 positions shown · non-contrast
Comparison: 11/30/2018

CLINICAL DATA: Weakness. Progressive lethargy.

EXAM:
PORTABLE CHEST 1 VIEW

[chest ap]
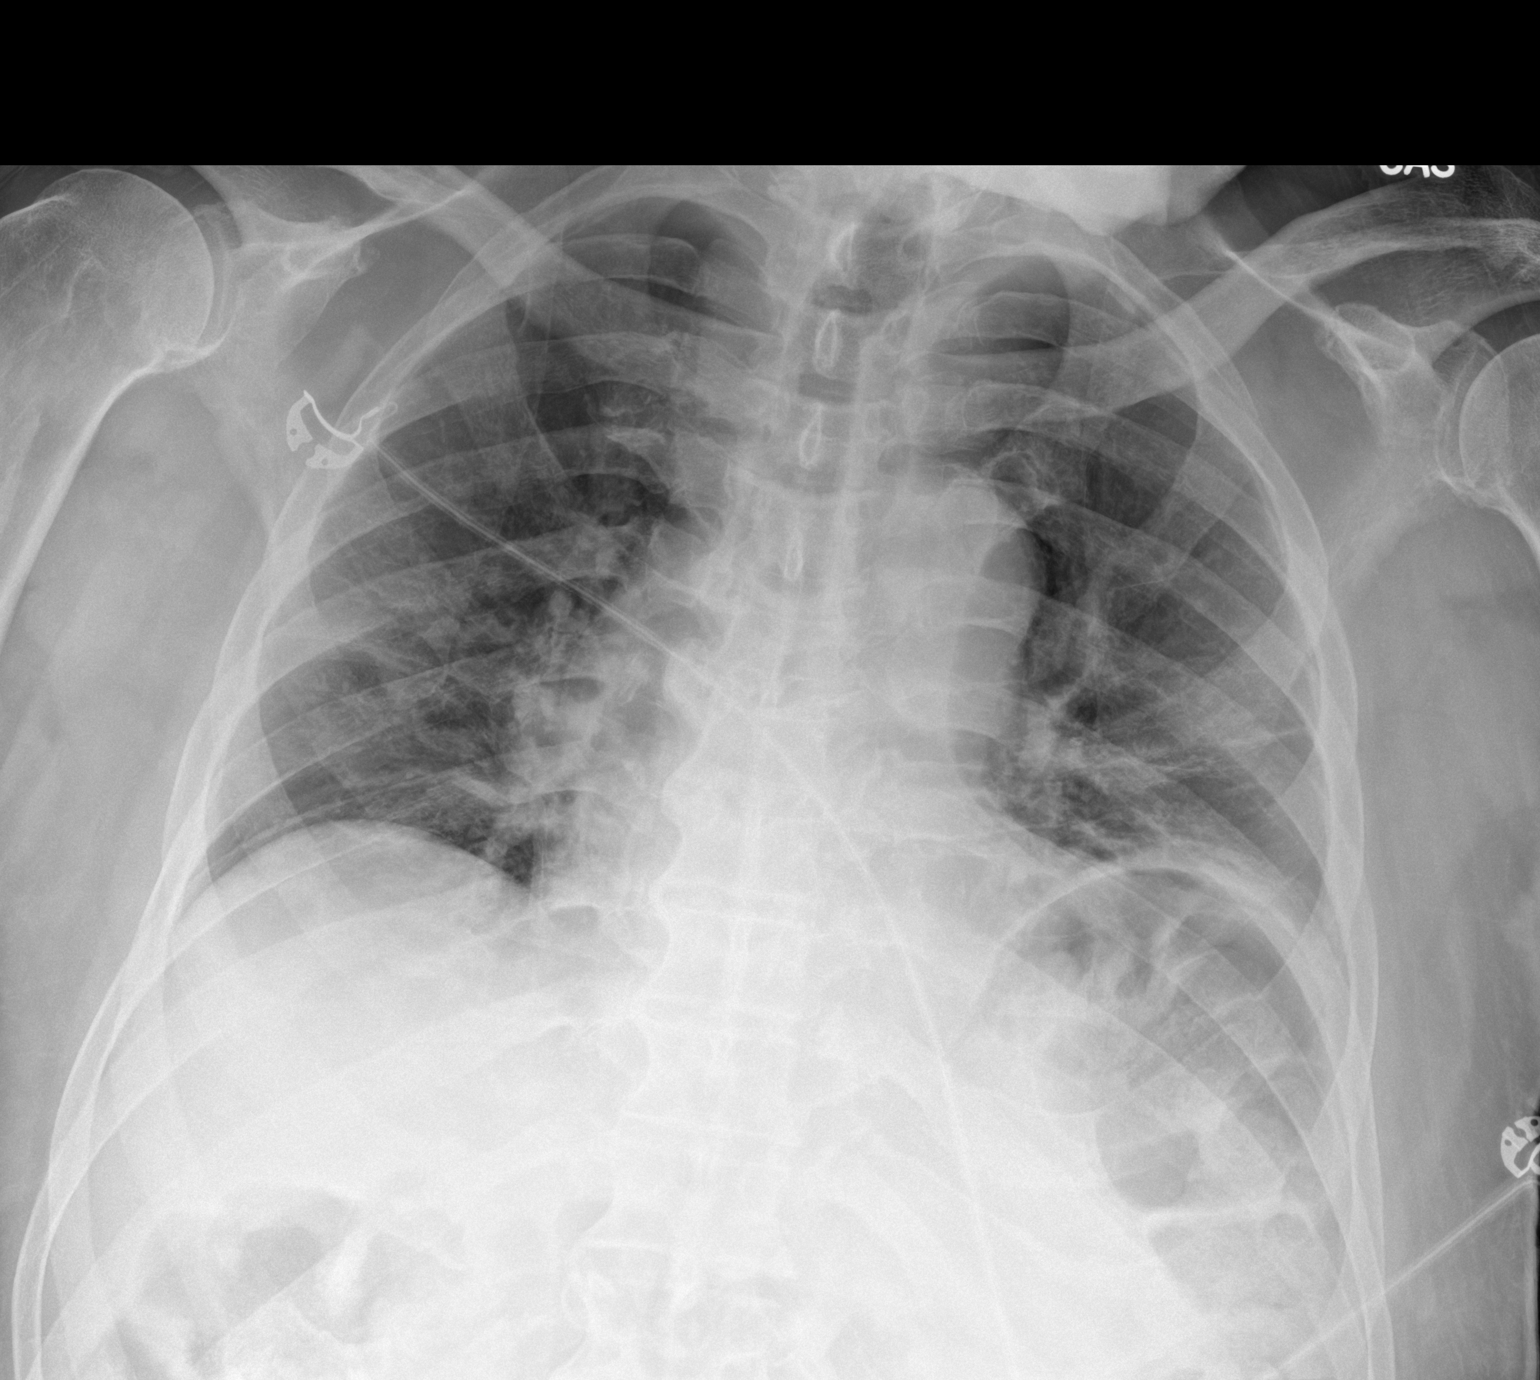

[1 of 1 positions shown; findings below may reference images not displayed]

FINDINGS: Low lung volumes persist. Streaky left lung base opacities with
slight progression from prior exam. Streaky right lung base
atelectasis or scarring, unchanged. No pulmonary edema, large
pleural effusion or pneumothorax. Unchanged heart size and
mediastinal contours. No acute osseous abnormalities.
IMPRESSION: Hypoventilatory chest. Streaky left lung base opacities with slight
progression from prior exam. Atelectasis is favored, an element of
aspiration or pneumonia also considered. Right lung base atelectasis
or scarring.

## 2020-11-19 DIAGNOSIS — H25813 Combined forms of age-related cataract, bilateral: Secondary | ICD-10-CM | POA: Diagnosis not present

## 2020-11-19 DIAGNOSIS — H401134 Primary open-angle glaucoma, bilateral, indeterminate stage: Secondary | ICD-10-CM | POA: Diagnosis not present

## 2020-12-09 DIAGNOSIS — E782 Mixed hyperlipidemia: Secondary | ICD-10-CM | POA: Diagnosis not present

## 2020-12-09 DIAGNOSIS — I10 Essential (primary) hypertension: Secondary | ICD-10-CM | POA: Diagnosis not present

## 2020-12-09 DIAGNOSIS — G4700 Insomnia, unspecified: Secondary | ICD-10-CM | POA: Diagnosis not present

## 2020-12-09 DIAGNOSIS — E559 Vitamin D deficiency, unspecified: Secondary | ICD-10-CM | POA: Diagnosis not present

## 2020-12-09 DIAGNOSIS — E119 Type 2 diabetes mellitus without complications: Secondary | ICD-10-CM | POA: Diagnosis not present

## 2020-12-10 DIAGNOSIS — Z79899 Other long term (current) drug therapy: Secondary | ICD-10-CM | POA: Diagnosis not present

## 2020-12-29 DIAGNOSIS — R5381 Other malaise: Secondary | ICD-10-CM | POA: Diagnosis not present

## 2020-12-29 DIAGNOSIS — R262 Difficulty in walking, not elsewhere classified: Secondary | ICD-10-CM | POA: Diagnosis not present

## 2020-12-29 DIAGNOSIS — M6281 Muscle weakness (generalized): Secondary | ICD-10-CM | POA: Diagnosis not present

## 2020-12-30 DIAGNOSIS — M6281 Muscle weakness (generalized): Secondary | ICD-10-CM | POA: Diagnosis not present

## 2020-12-30 DIAGNOSIS — R5381 Other malaise: Secondary | ICD-10-CM | POA: Diagnosis not present

## 2020-12-30 DIAGNOSIS — R262 Difficulty in walking, not elsewhere classified: Secondary | ICD-10-CM | POA: Diagnosis not present

## 2020-12-31 DIAGNOSIS — M6281 Muscle weakness (generalized): Secondary | ICD-10-CM | POA: Diagnosis not present

## 2020-12-31 DIAGNOSIS — R262 Difficulty in walking, not elsewhere classified: Secondary | ICD-10-CM | POA: Diagnosis not present

## 2020-12-31 DIAGNOSIS — R5381 Other malaise: Secondary | ICD-10-CM | POA: Diagnosis not present

## 2021-01-01 DIAGNOSIS — R262 Difficulty in walking, not elsewhere classified: Secondary | ICD-10-CM | POA: Diagnosis not present

## 2021-01-01 DIAGNOSIS — M6281 Muscle weakness (generalized): Secondary | ICD-10-CM | POA: Diagnosis not present

## 2021-01-01 DIAGNOSIS — R5381 Other malaise: Secondary | ICD-10-CM | POA: Diagnosis not present

## 2021-01-02 DIAGNOSIS — R262 Difficulty in walking, not elsewhere classified: Secondary | ICD-10-CM | POA: Diagnosis not present

## 2021-01-02 DIAGNOSIS — R5381 Other malaise: Secondary | ICD-10-CM | POA: Diagnosis not present

## 2021-01-02 DIAGNOSIS — M6281 Muscle weakness (generalized): Secondary | ICD-10-CM | POA: Diagnosis not present

## 2021-01-05 DIAGNOSIS — R5381 Other malaise: Secondary | ICD-10-CM | POA: Diagnosis not present

## 2021-01-05 DIAGNOSIS — M6281 Muscle weakness (generalized): Secondary | ICD-10-CM | POA: Diagnosis not present

## 2021-01-05 DIAGNOSIS — R262 Difficulty in walking, not elsewhere classified: Secondary | ICD-10-CM | POA: Diagnosis not present

## 2021-01-06 DIAGNOSIS — E559 Vitamin D deficiency, unspecified: Secondary | ICD-10-CM | POA: Diagnosis not present

## 2021-01-06 DIAGNOSIS — M6281 Muscle weakness (generalized): Secondary | ICD-10-CM | POA: Diagnosis not present

## 2021-01-06 DIAGNOSIS — E119 Type 2 diabetes mellitus without complications: Secondary | ICD-10-CM | POA: Diagnosis not present

## 2021-01-06 DIAGNOSIS — R5381 Other malaise: Secondary | ICD-10-CM | POA: Diagnosis not present

## 2021-01-06 DIAGNOSIS — R262 Difficulty in walking, not elsewhere classified: Secondary | ICD-10-CM | POA: Diagnosis not present

## 2021-01-06 DIAGNOSIS — G4700 Insomnia, unspecified: Secondary | ICD-10-CM | POA: Diagnosis not present

## 2021-01-06 DIAGNOSIS — I10 Essential (primary) hypertension: Secondary | ICD-10-CM | POA: Diagnosis not present

## 2021-01-06 DIAGNOSIS — E785 Hyperlipidemia, unspecified: Secondary | ICD-10-CM | POA: Diagnosis not present

## 2021-01-07 DIAGNOSIS — R5381 Other malaise: Secondary | ICD-10-CM | POA: Diagnosis not present

## 2021-01-07 DIAGNOSIS — R262 Difficulty in walking, not elsewhere classified: Secondary | ICD-10-CM | POA: Diagnosis not present

## 2021-01-07 DIAGNOSIS — M6281 Muscle weakness (generalized): Secondary | ICD-10-CM | POA: Diagnosis not present

## 2021-01-08 DIAGNOSIS — R5381 Other malaise: Secondary | ICD-10-CM | POA: Diagnosis not present

## 2021-01-08 DIAGNOSIS — M6281 Muscle weakness (generalized): Secondary | ICD-10-CM | POA: Diagnosis not present

## 2021-01-08 DIAGNOSIS — R262 Difficulty in walking, not elsewhere classified: Secondary | ICD-10-CM | POA: Diagnosis not present

## 2021-01-09 DIAGNOSIS — R262 Difficulty in walking, not elsewhere classified: Secondary | ICD-10-CM | POA: Diagnosis not present

## 2021-01-09 DIAGNOSIS — M6281 Muscle weakness (generalized): Secondary | ICD-10-CM | POA: Diagnosis not present

## 2021-01-09 DIAGNOSIS — R5381 Other malaise: Secondary | ICD-10-CM | POA: Diagnosis not present

## 2021-01-12 DIAGNOSIS — R5381 Other malaise: Secondary | ICD-10-CM | POA: Diagnosis not present

## 2021-01-12 DIAGNOSIS — R262 Difficulty in walking, not elsewhere classified: Secondary | ICD-10-CM | POA: Diagnosis not present

## 2021-01-12 DIAGNOSIS — M6281 Muscle weakness (generalized): Secondary | ICD-10-CM | POA: Diagnosis not present

## 2021-01-13 DIAGNOSIS — M6281 Muscle weakness (generalized): Secondary | ICD-10-CM | POA: Diagnosis not present

## 2021-01-14 DIAGNOSIS — M6281 Muscle weakness (generalized): Secondary | ICD-10-CM | POA: Diagnosis not present

## 2021-01-15 DIAGNOSIS — M6281 Muscle weakness (generalized): Secondary | ICD-10-CM | POA: Diagnosis not present

## 2021-01-16 DIAGNOSIS — M6281 Muscle weakness (generalized): Secondary | ICD-10-CM | POA: Diagnosis not present

## 2021-01-19 DIAGNOSIS — M6281 Muscle weakness (generalized): Secondary | ICD-10-CM | POA: Diagnosis not present

## 2021-01-20 DIAGNOSIS — M6281 Muscle weakness (generalized): Secondary | ICD-10-CM | POA: Diagnosis not present

## 2021-01-21 DIAGNOSIS — M6281 Muscle weakness (generalized): Secondary | ICD-10-CM | POA: Diagnosis not present

## 2021-01-29 DIAGNOSIS — I82622 Acute embolism and thrombosis of deep veins of left upper extremity: Secondary | ICD-10-CM | POA: Diagnosis not present

## 2021-01-29 DIAGNOSIS — I10 Essential (primary) hypertension: Secondary | ICD-10-CM | POA: Diagnosis not present

## 2021-01-29 DIAGNOSIS — G4700 Insomnia, unspecified: Secondary | ICD-10-CM | POA: Diagnosis not present

## 2021-01-29 DIAGNOSIS — E559 Vitamin D deficiency, unspecified: Secondary | ICD-10-CM | POA: Diagnosis not present

## 2021-01-29 DIAGNOSIS — E119 Type 2 diabetes mellitus without complications: Secondary | ICD-10-CM | POA: Diagnosis not present

## 2021-02-03 DIAGNOSIS — E782 Mixed hyperlipidemia: Secondary | ICD-10-CM | POA: Diagnosis not present

## 2021-02-03 DIAGNOSIS — G4700 Insomnia, unspecified: Secondary | ICD-10-CM | POA: Diagnosis not present

## 2021-02-03 DIAGNOSIS — E559 Vitamin D deficiency, unspecified: Secondary | ICD-10-CM | POA: Diagnosis not present

## 2021-02-03 DIAGNOSIS — E119 Type 2 diabetes mellitus without complications: Secondary | ICD-10-CM | POA: Diagnosis not present

## 2021-02-03 DIAGNOSIS — I10 Essential (primary) hypertension: Secondary | ICD-10-CM | POA: Diagnosis not present

## 2021-02-12 DIAGNOSIS — H401134 Primary open-angle glaucoma, bilateral, indeterminate stage: Secondary | ICD-10-CM | POA: Diagnosis not present

## 2021-02-12 DIAGNOSIS — E119 Type 2 diabetes mellitus without complications: Secondary | ICD-10-CM | POA: Diagnosis not present

## 2021-02-12 DIAGNOSIS — H25813 Combined forms of age-related cataract, bilateral: Secondary | ICD-10-CM | POA: Diagnosis not present

## 2021-03-05 DIAGNOSIS — I82622 Acute embolism and thrombosis of deep veins of left upper extremity: Secondary | ICD-10-CM | POA: Diagnosis not present

## 2021-03-05 DIAGNOSIS — E119 Type 2 diabetes mellitus without complications: Secondary | ICD-10-CM | POA: Diagnosis not present

## 2021-03-05 DIAGNOSIS — E559 Vitamin D deficiency, unspecified: Secondary | ICD-10-CM | POA: Diagnosis not present

## 2021-03-05 DIAGNOSIS — I10 Essential (primary) hypertension: Secondary | ICD-10-CM | POA: Diagnosis not present

## 2021-03-05 DIAGNOSIS — G4700 Insomnia, unspecified: Secondary | ICD-10-CM | POA: Diagnosis not present

## 2021-03-06 DIAGNOSIS — G4700 Insomnia, unspecified: Secondary | ICD-10-CM | POA: Diagnosis not present

## 2021-03-06 DIAGNOSIS — E782 Mixed hyperlipidemia: Secondary | ICD-10-CM | POA: Diagnosis not present

## 2021-03-06 DIAGNOSIS — I10 Essential (primary) hypertension: Secondary | ICD-10-CM | POA: Diagnosis not present

## 2021-03-06 DIAGNOSIS — E559 Vitamin D deficiency, unspecified: Secondary | ICD-10-CM | POA: Diagnosis not present

## 2021-03-06 DIAGNOSIS — E119 Type 2 diabetes mellitus without complications: Secondary | ICD-10-CM | POA: Diagnosis not present

## 2021-03-10 DIAGNOSIS — M6281 Muscle weakness (generalized): Secondary | ICD-10-CM | POA: Diagnosis not present

## 2021-03-11 DIAGNOSIS — I10 Essential (primary) hypertension: Secondary | ICD-10-CM | POA: Diagnosis not present

## 2021-03-11 DIAGNOSIS — E119 Type 2 diabetes mellitus without complications: Secondary | ICD-10-CM | POA: Diagnosis not present

## 2021-03-11 DIAGNOSIS — E782 Mixed hyperlipidemia: Secondary | ICD-10-CM | POA: Diagnosis not present

## 2021-03-11 DIAGNOSIS — E559 Vitamin D deficiency, unspecified: Secondary | ICD-10-CM | POA: Diagnosis not present

## 2021-03-24 DIAGNOSIS — M19042 Primary osteoarthritis, left hand: Secondary | ICD-10-CM | POA: Diagnosis not present

## 2021-03-26 DIAGNOSIS — E790 Hyperuricemia without signs of inflammatory arthritis and tophaceous disease: Secondary | ICD-10-CM | POA: Diagnosis not present

## 2021-03-26 DIAGNOSIS — Z79899 Other long term (current) drug therapy: Secondary | ICD-10-CM | POA: Diagnosis not present

## 2021-03-26 DIAGNOSIS — Z5181 Encounter for therapeutic drug level monitoring: Secondary | ICD-10-CM | POA: Diagnosis not present

## 2021-04-01 DIAGNOSIS — M6281 Muscle weakness (generalized): Secondary | ICD-10-CM | POA: Diagnosis not present

## 2021-04-02 DIAGNOSIS — E119 Type 2 diabetes mellitus without complications: Secondary | ICD-10-CM | POA: Diagnosis not present

## 2021-04-02 DIAGNOSIS — I82622 Acute embolism and thrombosis of deep veins of left upper extremity: Secondary | ICD-10-CM | POA: Diagnosis not present

## 2021-04-02 DIAGNOSIS — E782 Mixed hyperlipidemia: Secondary | ICD-10-CM | POA: Diagnosis not present

## 2021-04-02 DIAGNOSIS — I10 Essential (primary) hypertension: Secondary | ICD-10-CM | POA: Diagnosis not present

## 2021-04-02 DIAGNOSIS — G4700 Insomnia, unspecified: Secondary | ICD-10-CM | POA: Diagnosis not present

## 2021-04-07 DIAGNOSIS — I82622 Acute embolism and thrombosis of deep veins of left upper extremity: Secondary | ICD-10-CM | POA: Diagnosis not present

## 2021-04-08 DIAGNOSIS — G4700 Insomnia, unspecified: Secondary | ICD-10-CM | POA: Diagnosis not present

## 2021-04-08 DIAGNOSIS — E782 Mixed hyperlipidemia: Secondary | ICD-10-CM | POA: Diagnosis not present

## 2021-04-08 DIAGNOSIS — E119 Type 2 diabetes mellitus without complications: Secondary | ICD-10-CM | POA: Diagnosis not present

## 2021-04-08 DIAGNOSIS — R609 Edema, unspecified: Secondary | ICD-10-CM | POA: Diagnosis not present

## 2021-04-08 DIAGNOSIS — R531 Weakness: Secondary | ICD-10-CM | POA: Diagnosis not present

## 2021-04-08 DIAGNOSIS — E559 Vitamin D deficiency, unspecified: Secondary | ICD-10-CM | POA: Diagnosis not present

## 2021-04-08 DIAGNOSIS — I82622 Acute embolism and thrombosis of deep veins of left upper extremity: Secondary | ICD-10-CM | POA: Diagnosis not present

## 2021-04-08 DIAGNOSIS — Z79899 Other long term (current) drug therapy: Secondary | ICD-10-CM | POA: Diagnosis not present

## 2021-04-08 DIAGNOSIS — E785 Hyperlipidemia, unspecified: Secondary | ICD-10-CM | POA: Diagnosis not present

## 2021-04-08 DIAGNOSIS — R404 Transient alteration of awareness: Secondary | ICD-10-CM | POA: Diagnosis not present

## 2021-04-08 DIAGNOSIS — I10 Essential (primary) hypertension: Secondary | ICD-10-CM | POA: Diagnosis not present

## 2021-04-08 DIAGNOSIS — Z743 Need for continuous supervision: Secondary | ICD-10-CM | POA: Diagnosis not present

## 2021-04-10 DIAGNOSIS — M6281 Muscle weakness (generalized): Secondary | ICD-10-CM | POA: Diagnosis not present

## 2021-04-23 DIAGNOSIS — E1159 Type 2 diabetes mellitus with other circulatory complications: Secondary | ICD-10-CM | POA: Diagnosis not present

## 2021-04-23 DIAGNOSIS — B351 Tinea unguium: Secondary | ICD-10-CM | POA: Diagnosis not present

## 2021-04-30 DIAGNOSIS — E119 Type 2 diabetes mellitus without complications: Secondary | ICD-10-CM | POA: Diagnosis not present

## 2021-04-30 DIAGNOSIS — I10 Essential (primary) hypertension: Secondary | ICD-10-CM | POA: Diagnosis not present

## 2021-04-30 DIAGNOSIS — G4700 Insomnia, unspecified: Secondary | ICD-10-CM | POA: Diagnosis not present

## 2021-04-30 DIAGNOSIS — I82622 Acute embolism and thrombosis of deep veins of left upper extremity: Secondary | ICD-10-CM | POA: Diagnosis not present

## 2021-04-30 DIAGNOSIS — E782 Mixed hyperlipidemia: Secondary | ICD-10-CM | POA: Diagnosis not present

## 2021-04-30 DIAGNOSIS — E559 Vitamin D deficiency, unspecified: Secondary | ICD-10-CM | POA: Diagnosis not present

## 2021-05-13 DIAGNOSIS — E559 Vitamin D deficiency, unspecified: Secondary | ICD-10-CM | POA: Diagnosis not present

## 2021-05-13 DIAGNOSIS — E119 Type 2 diabetes mellitus without complications: Secondary | ICD-10-CM | POA: Diagnosis not present

## 2021-05-13 DIAGNOSIS — E782 Mixed hyperlipidemia: Secondary | ICD-10-CM | POA: Diagnosis not present

## 2021-05-13 DIAGNOSIS — I10 Essential (primary) hypertension: Secondary | ICD-10-CM | POA: Diagnosis not present

## 2021-05-13 DIAGNOSIS — G4700 Insomnia, unspecified: Secondary | ICD-10-CM | POA: Diagnosis not present

## 2021-06-04 DIAGNOSIS — E782 Mixed hyperlipidemia: Secondary | ICD-10-CM | POA: Diagnosis not present

## 2021-06-04 DIAGNOSIS — E119 Type 2 diabetes mellitus without complications: Secondary | ICD-10-CM | POA: Diagnosis not present

## 2021-06-04 DIAGNOSIS — I10 Essential (primary) hypertension: Secondary | ICD-10-CM | POA: Diagnosis not present

## 2021-06-04 DIAGNOSIS — E559 Vitamin D deficiency, unspecified: Secondary | ICD-10-CM | POA: Diagnosis not present

## 2021-06-04 DIAGNOSIS — G4700 Insomnia, unspecified: Secondary | ICD-10-CM | POA: Diagnosis not present

## 2021-06-05 DIAGNOSIS — H401134 Primary open-angle glaucoma, bilateral, indeterminate stage: Secondary | ICD-10-CM | POA: Diagnosis not present

## 2021-06-05 DIAGNOSIS — H25813 Combined forms of age-related cataract, bilateral: Secondary | ICD-10-CM | POA: Diagnosis not present

## 2021-06-10 DIAGNOSIS — I10 Essential (primary) hypertension: Secondary | ICD-10-CM | POA: Diagnosis not present

## 2021-06-10 DIAGNOSIS — E782 Mixed hyperlipidemia: Secondary | ICD-10-CM | POA: Diagnosis not present

## 2021-06-10 DIAGNOSIS — G4700 Insomnia, unspecified: Secondary | ICD-10-CM | POA: Diagnosis not present

## 2021-06-10 DIAGNOSIS — E119 Type 2 diabetes mellitus without complications: Secondary | ICD-10-CM | POA: Diagnosis not present

## 2021-06-10 DIAGNOSIS — E559 Vitamin D deficiency, unspecified: Secondary | ICD-10-CM | POA: Diagnosis not present

## 2021-07-07 DIAGNOSIS — R279 Unspecified lack of coordination: Secondary | ICD-10-CM | POA: Diagnosis not present

## 2021-07-07 DIAGNOSIS — E1159 Type 2 diabetes mellitus with other circulatory complications: Secondary | ICD-10-CM | POA: Diagnosis not present

## 2021-07-07 DIAGNOSIS — R5381 Other malaise: Secondary | ICD-10-CM | POA: Diagnosis not present

## 2021-07-07 DIAGNOSIS — B351 Tinea unguium: Secondary | ICD-10-CM | POA: Diagnosis not present

## 2021-07-07 DIAGNOSIS — R293 Abnormal posture: Secondary | ICD-10-CM | POA: Diagnosis not present

## 2021-07-08 DIAGNOSIS — R279 Unspecified lack of coordination: Secondary | ICD-10-CM | POA: Diagnosis not present

## 2021-07-08 DIAGNOSIS — R5381 Other malaise: Secondary | ICD-10-CM | POA: Diagnosis not present

## 2021-07-08 DIAGNOSIS — R293 Abnormal posture: Secondary | ICD-10-CM | POA: Diagnosis not present

## 2021-07-09 DIAGNOSIS — R293 Abnormal posture: Secondary | ICD-10-CM | POA: Diagnosis not present

## 2021-07-09 DIAGNOSIS — R279 Unspecified lack of coordination: Secondary | ICD-10-CM | POA: Diagnosis not present

## 2021-07-09 DIAGNOSIS — R5381 Other malaise: Secondary | ICD-10-CM | POA: Diagnosis not present

## 2021-07-10 DIAGNOSIS — R279 Unspecified lack of coordination: Secondary | ICD-10-CM | POA: Diagnosis not present

## 2021-07-10 DIAGNOSIS — R293 Abnormal posture: Secondary | ICD-10-CM | POA: Diagnosis not present

## 2021-07-10 DIAGNOSIS — R5381 Other malaise: Secondary | ICD-10-CM | POA: Diagnosis not present

## 2021-07-13 DIAGNOSIS — R5381 Other malaise: Secondary | ICD-10-CM | POA: Diagnosis not present

## 2021-07-13 DIAGNOSIS — R293 Abnormal posture: Secondary | ICD-10-CM | POA: Diagnosis not present

## 2021-07-13 DIAGNOSIS — R279 Unspecified lack of coordination: Secondary | ICD-10-CM | POA: Diagnosis not present

## 2021-07-14 DIAGNOSIS — R279 Unspecified lack of coordination: Secondary | ICD-10-CM | POA: Diagnosis not present

## 2021-07-14 DIAGNOSIS — R293 Abnormal posture: Secondary | ICD-10-CM | POA: Diagnosis not present

## 2021-07-14 DIAGNOSIS — R5381 Other malaise: Secondary | ICD-10-CM | POA: Diagnosis not present

## 2021-07-15 DIAGNOSIS — R279 Unspecified lack of coordination: Secondary | ICD-10-CM | POA: Diagnosis not present

## 2021-07-15 DIAGNOSIS — R293 Abnormal posture: Secondary | ICD-10-CM | POA: Diagnosis not present

## 2021-07-15 DIAGNOSIS — R5381 Other malaise: Secondary | ICD-10-CM | POA: Diagnosis not present
